# Patient Record
Sex: Male | Born: 1940 | Race: White | Hispanic: No | Smoking: Current every day smoker
Health system: Southern US, Community
[De-identification: ages and names within clinical notes are randomized; demographics above are authoritative.]

## PROBLEM LIST (undated history)

## (undated) DIAGNOSIS — I714 Abdominal aortic aneurysm, without rupture, unspecified: Secondary | ICD-10-CM

## (undated) DIAGNOSIS — E785 Hyperlipidemia, unspecified: Secondary | ICD-10-CM

## (undated) DIAGNOSIS — G4700 Insomnia, unspecified: Secondary | ICD-10-CM

## (undated) DIAGNOSIS — F32A Depression, unspecified: Secondary | ICD-10-CM

## (undated) DIAGNOSIS — E119 Type 2 diabetes mellitus without complications: Secondary | ICD-10-CM

## (undated) DIAGNOSIS — H269 Unspecified cataract: Secondary | ICD-10-CM

## (undated) DIAGNOSIS — F329 Major depressive disorder, single episode, unspecified: Secondary | ICD-10-CM

## (undated) DIAGNOSIS — R51 Headache: Secondary | ICD-10-CM

## (undated) DIAGNOSIS — J449 Chronic obstructive pulmonary disease, unspecified: Secondary | ICD-10-CM

## (undated) DIAGNOSIS — K633 Ulcer of intestine: Secondary | ICD-10-CM

## (undated) DIAGNOSIS — I82409 Acute embolism and thrombosis of unspecified deep veins of unspecified lower extremity: Secondary | ICD-10-CM

## (undated) DIAGNOSIS — K635 Polyp of colon: Secondary | ICD-10-CM

## (undated) DIAGNOSIS — K219 Gastro-esophageal reflux disease without esophagitis: Secondary | ICD-10-CM

## (undated) DIAGNOSIS — I739 Peripheral vascular disease, unspecified: Secondary | ICD-10-CM

## (undated) DIAGNOSIS — I10 Essential (primary) hypertension: Secondary | ICD-10-CM

## (undated) DIAGNOSIS — K649 Unspecified hemorrhoids: Secondary | ICD-10-CM

## (undated) DIAGNOSIS — M109 Gout, unspecified: Secondary | ICD-10-CM

## (undated) DIAGNOSIS — I2510 Atherosclerotic heart disease of native coronary artery without angina pectoris: Secondary | ICD-10-CM

## (undated) DIAGNOSIS — K5790 Diverticulosis of intestine, part unspecified, without perforation or abscess without bleeding: Secondary | ICD-10-CM

## (undated) HISTORY — DX: Gastro-esophageal reflux disease without esophagitis: K21.9

## (undated) HISTORY — PX: COLONOSCOPY: SHX174

## (undated) HISTORY — DX: Acute embolism and thrombosis of unspecified deep veins of unspecified lower extremity: I82.409

## (undated) HISTORY — DX: Gout, unspecified: M10.9

## (undated) HISTORY — DX: Ulcer of intestine: K63.3

## (undated) HISTORY — DX: Unspecified cataract: H26.9

## (undated) HISTORY — DX: Abdominal aortic aneurysm, without rupture, unspecified: I71.40

## (undated) HISTORY — DX: Abdominal aortic aneurysm, without rupture: I71.4

## (undated) HISTORY — DX: Chronic obstructive pulmonary disease, unspecified: J44.9

## (undated) HISTORY — DX: Polyp of colon: K63.5

## (undated) HISTORY — DX: Major depressive disorder, single episode, unspecified: F32.9

## (undated) HISTORY — DX: Diverticulosis of intestine, part unspecified, without perforation or abscess without bleeding: K57.90

## (undated) HISTORY — DX: Unspecified hemorrhoids: K64.9

## (undated) HISTORY — DX: Depression, unspecified: F32.A

## (undated) HISTORY — DX: Hyperlipidemia, unspecified: E78.5

## (undated) HISTORY — DX: Type 2 diabetes mellitus without complications: E11.9

## (undated) HISTORY — PX: HEMICOLECTOMY: SHX854

## (undated) HISTORY — DX: Insomnia, unspecified: G47.00

## (undated) HISTORY — DX: Essential (primary) hypertension: I10

## (undated) HISTORY — DX: Peripheral vascular disease, unspecified: I73.9

---

## 2003-01-03 ENCOUNTER — Encounter: Payer: Self-pay | Admitting: Internal Medicine

## 2003-01-03 HISTORY — PX: ESOPHAGOGASTRODUODENOSCOPY: SHX1529

## 2005-07-08 ENCOUNTER — Ambulatory Visit: Payer: Self-pay | Admitting: Internal Medicine

## 2005-08-01 ENCOUNTER — Ambulatory Visit: Payer: Self-pay | Admitting: Cardiology

## 2005-08-15 ENCOUNTER — Ambulatory Visit: Payer: Self-pay

## 2005-09-08 ENCOUNTER — Ambulatory Visit: Payer: Self-pay

## 2005-11-07 ENCOUNTER — Ambulatory Visit: Payer: Self-pay | Admitting: Cardiology

## 2005-11-11 ENCOUNTER — Ambulatory Visit: Payer: Self-pay

## 2005-12-28 ENCOUNTER — Ambulatory Visit: Payer: Self-pay | Admitting: Cardiology

## 2006-01-03 ENCOUNTER — Inpatient Hospital Stay (HOSPITAL_BASED_OUTPATIENT_CLINIC_OR_DEPARTMENT_OTHER): Admission: RE | Admit: 2006-01-03 | Discharge: 2006-01-03 | Payer: Self-pay | Admitting: Cardiology

## 2006-01-03 ENCOUNTER — Ambulatory Visit: Payer: Self-pay | Admitting: Cardiology

## 2007-10-05 ENCOUNTER — Ambulatory Visit: Payer: Self-pay | Admitting: Internal Medicine

## 2007-10-25 ENCOUNTER — Encounter: Payer: Self-pay | Admitting: Internal Medicine

## 2007-10-25 ENCOUNTER — Ambulatory Visit: Payer: Self-pay | Admitting: Internal Medicine

## 2008-01-03 DIAGNOSIS — I739 Peripheral vascular disease, unspecified: Secondary | ICD-10-CM

## 2008-01-03 DIAGNOSIS — E785 Hyperlipidemia, unspecified: Secondary | ICD-10-CM

## 2008-01-03 DIAGNOSIS — D126 Benign neoplasm of colon, unspecified: Secondary | ICD-10-CM | POA: Insufficient documentation

## 2008-01-03 DIAGNOSIS — F17200 Nicotine dependence, unspecified, uncomplicated: Secondary | ICD-10-CM | POA: Insufficient documentation

## 2008-01-03 DIAGNOSIS — E1069 Type 1 diabetes mellitus with other specified complication: Secondary | ICD-10-CM

## 2008-01-03 DIAGNOSIS — I25810 Atherosclerosis of coronary artery bypass graft(s) without angina pectoris: Secondary | ICD-10-CM

## 2008-01-03 DIAGNOSIS — K648 Other hemorrhoids: Secondary | ICD-10-CM | POA: Insufficient documentation

## 2008-01-03 DIAGNOSIS — K219 Gastro-esophageal reflux disease without esophagitis: Secondary | ICD-10-CM

## 2008-01-03 DIAGNOSIS — I10 Essential (primary) hypertension: Secondary | ICD-10-CM

## 2008-01-24 ENCOUNTER — Encounter: Payer: Self-pay | Admitting: Internal Medicine

## 2009-03-30 ENCOUNTER — Telehealth: Payer: Self-pay | Admitting: Internal Medicine

## 2009-04-23 ENCOUNTER — Ambulatory Visit: Payer: Self-pay | Admitting: Internal Medicine

## 2009-04-23 DIAGNOSIS — Z860100 Personal history of colon polyps, unspecified: Secondary | ICD-10-CM | POA: Insufficient documentation

## 2009-04-28 ENCOUNTER — Encounter: Payer: Self-pay | Admitting: Internal Medicine

## 2009-04-28 ENCOUNTER — Ambulatory Visit: Payer: Self-pay | Admitting: Internal Medicine

## 2009-06-24 ENCOUNTER — Ambulatory Visit (HOSPITAL_COMMUNITY): Admission: RE | Admit: 2009-06-24 | Discharge: 2009-06-24 | Payer: Self-pay | Admitting: General Surgery

## 2009-07-21 ENCOUNTER — Ambulatory Visit: Payer: Self-pay | Admitting: Cardiology

## 2009-07-29 ENCOUNTER — Telehealth (INDEPENDENT_AMBULATORY_CARE_PROVIDER_SITE_OTHER): Payer: Self-pay

## 2009-07-30 ENCOUNTER — Ambulatory Visit: Payer: Self-pay

## 2009-07-30 ENCOUNTER — Encounter: Payer: Self-pay | Admitting: Cardiology

## 2009-08-10 ENCOUNTER — Telehealth: Payer: Self-pay | Admitting: Cardiology

## 2009-08-17 ENCOUNTER — Telehealth (INDEPENDENT_AMBULATORY_CARE_PROVIDER_SITE_OTHER): Payer: Self-pay | Admitting: *Deleted

## 2009-08-18 ENCOUNTER — Inpatient Hospital Stay (HOSPITAL_COMMUNITY): Admission: RE | Admit: 2009-08-18 | Discharge: 2009-08-28 | Payer: Self-pay | Admitting: General Surgery

## 2009-08-18 ENCOUNTER — Encounter (INDEPENDENT_AMBULATORY_CARE_PROVIDER_SITE_OTHER): Payer: Self-pay | Admitting: General Surgery

## 2009-08-18 ENCOUNTER — Encounter: Payer: Self-pay | Admitting: Internal Medicine

## 2009-08-18 ENCOUNTER — Ambulatory Visit: Payer: Self-pay | Admitting: Internal Medicine

## 2009-10-30 ENCOUNTER — Encounter: Payer: Self-pay | Admitting: Internal Medicine

## 2010-04-20 IMAGING — CR DG CHEST 1V PORT
1 series · 1 of 1 positions shown · non-contrast
Comparison: 08/22/2009

CLINICAL DATA: Short of breath

PORTABLE CHEST - 1 VIEW

[view not recorded]
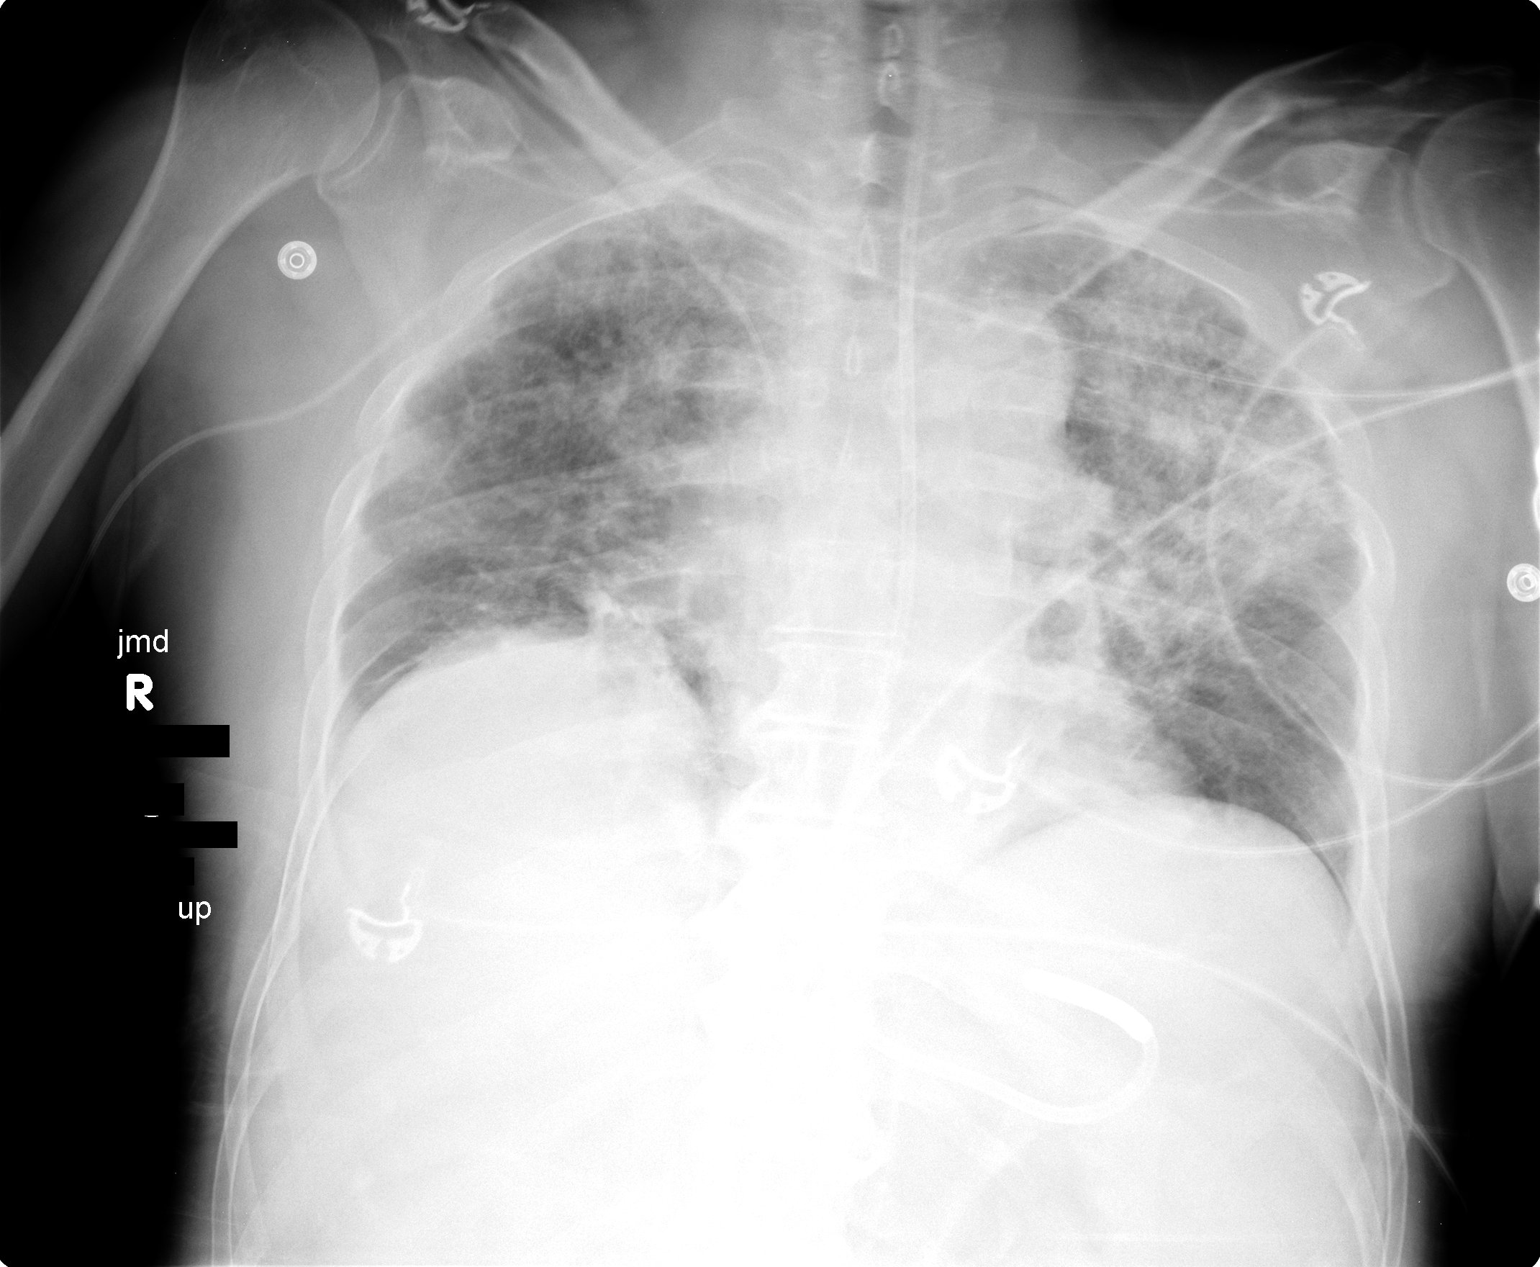

[1 of 1 positions shown; findings below may reference images not displayed]

FINDINGS: Stable tubular devices.  Bilateral airspace disease most
severe in the left upper lobe is worse.  No pneumothorax.  Low lung
volumes.
IMPRESSION: Worsening bilateral airspace disease.

## 2010-04-29 ENCOUNTER — Encounter (INDEPENDENT_AMBULATORY_CARE_PROVIDER_SITE_OTHER): Payer: Self-pay | Admitting: *Deleted

## 2010-12-21 NOTE — Letter (Signed)
Summary: Colonoscopy Letter  [facility]  [address] [place], [place] [address]   Phone: [phone]  Fax: [phone]      April 29, 2010 MRN: [id number]   [facility] [address] [place], [place]  [address]   Dear Perry Kennedy,   According to your medical record, it is time for you to schedule a Colonoscopy. The American Cancer Society recommends this procedure as a method to detect early colon cancer. Patients with a family history of colon cancer, or a personal history of colon polyps or inflammatory bowel disease are at increased risk.  This letter has beeen generated based on the recommendations made at the time of your procedure. If you feel that in your particular situation this may no longer apply, please contact our office.  Please call our office at [phone] to schedule this appointment or to update your records at your earliest convenience.  Thank you for cooperating with [place] to provide you with the very best care possible.   Sincerely,    Iva Boop, M.D.  [facility] [phone]

## 2011-02-24 LAB — BASIC METABOLIC PANEL
BUN: 11 mg/dL (ref 6–23)
BUN: 9 mg/dL (ref 6–23)
CO2: 27 mEq/L (ref 19–32)
CO2: 30 mEq/L (ref 19–32)
Calcium: 8.2 mg/dL — ABNORMAL LOW (ref 8.4–10.5)
Calcium: 8.6 mg/dL (ref 8.4–10.5)
Calcium: 8.8 mg/dL (ref 8.4–10.5)
Chloride: 101 mEq/L (ref 96–112)
Chloride: 98 mEq/L (ref 96–112)
Creatinine, Ser: 0.68 mg/dL (ref 0.4–1.5)
Creatinine, Ser: 0.68 mg/dL (ref 0.4–1.5)
GFR calc Af Amer: >60 mL/min (ref >60)
GFR calc Af Amer: >60 mL/min (ref >60)
GFR calc non Af Amer: >60 mL/min (ref >60)
GFR calc non Af Amer: >60 mL/min (ref >60)
Glucose, Bld: 102 mg/dL — ABNORMAL HIGH (ref 70–99)
Glucose, Bld: 173 mg/dL — ABNORMAL HIGH (ref 70–99)
Potassium: 3.6 mEq/L (ref 3.5–5.1)
Potassium: 3.7 mEq/L (ref 3.5–5.1)
Sodium: 134 mEq/L — ABNORMAL LOW (ref 135–145)
Sodium: 136 mEq/L (ref 135–145)

## 2011-02-24 LAB — URINE MICROSCOPIC-ADD ON

## 2011-02-24 LAB — CBC
HCT: 33.3 % — ABNORMAL LOW (ref 39.0–52.0)
HCT: 34.4 % — ABNORMAL LOW (ref 39.0–52.0)
HCT: 36.9 % — ABNORMAL LOW (ref 39.0–52.0)
Hemoglobin: 10.9 g/dL — ABNORMAL LOW (ref 13.0–17.0)
Hemoglobin: 12.1 g/dL — ABNORMAL LOW (ref 13.0–17.0)
Hemoglobin: 12.8 g/dL — ABNORMAL LOW (ref 13.0–17.0)
MCHC: 34.8 g/dL (ref 30.0–36.0)
MCHC: 35.2 g/dL (ref 30.0–36.0)
MCV: 86 fL (ref 78.0–100.0)
MCV: 87 fL (ref 78.0–100.0)
MCV: 87.1 fL (ref 78.0–100.0)
Platelets: 137 10*3/uL — ABNORMAL LOW (ref 150–400)
Platelets: 175 10*3/uL (ref 150–400)
Platelets: 189 10*3/uL (ref 150–400)
Platelets: 244 10*3/uL (ref 150–400)
Platelets: 275 10*3/uL (ref 150–400)
RBC: 3.62 MIL/uL — ABNORMAL LOW (ref 4.22–5.81)
RBC: 3.69 MIL/uL — ABNORMAL LOW (ref 4.22–5.81)
RDW: 13.5 % (ref 11.5–15.5)
RDW: 13.5 % (ref 11.5–15.5)
RDW: 13.7 % (ref 11.5–15.5)
WBC: 6.3 10*3/uL (ref 4.0–10.5)
WBC: 6.9 10*3/uL (ref 4.0–10.5)
WBC: 7.5 10*3/uL (ref 4.0–10.5)
WBC: 7.6 10*3/uL (ref 4.0–10.5)

## 2011-02-24 LAB — GLUCOSE, CAPILLARY
Glucose-Capillary: 111 mg/dL — ABNORMAL HIGH (ref 70–99)
Glucose-Capillary: 124 mg/dL — ABNORMAL HIGH (ref 70–99)
Glucose-Capillary: 126 mg/dL — ABNORMAL HIGH (ref 70–99)
Glucose-Capillary: 130 mg/dL — ABNORMAL HIGH (ref 70–99)
Glucose-Capillary: 135 mg/dL — ABNORMAL HIGH (ref 70–99)
Glucose-Capillary: 136 mg/dL — ABNORMAL HIGH (ref 70–99)
Glucose-Capillary: 137 mg/dL — ABNORMAL HIGH (ref 70–99)
Glucose-Capillary: 157 mg/dL — ABNORMAL HIGH (ref 70–99)
Glucose-Capillary: 157 mg/dL — ABNORMAL HIGH (ref 70–99)
Glucose-Capillary: 159 mg/dL — ABNORMAL HIGH (ref 70–99)
Glucose-Capillary: 161 mg/dL — ABNORMAL HIGH (ref 70–99)
Glucose-Capillary: 165 mg/dL — ABNORMAL HIGH (ref 70–99)
Glucose-Capillary: 171 mg/dL — ABNORMAL HIGH (ref 70–99)
Glucose-Capillary: 180 mg/dL — ABNORMAL HIGH (ref 70–99)
Glucose-Capillary: 194 mg/dL — ABNORMAL HIGH (ref 70–99)
Glucose-Capillary: 213 mg/dL — ABNORMAL HIGH (ref 70–99)

## 2011-02-24 LAB — BLOOD GAS, ARTERIAL
Bicarbonate: 29.9 mEq/L — ABNORMAL HIGH (ref 20.0–24.0)
Drawn by: 317771
FIO2: 0.6 %
MECHVT: 600 mL
pH, Arterial: 7.425 (ref 7.350–7.450)

## 2011-02-24 LAB — COMPREHENSIVE METABOLIC PANEL
AST: 40 U/L — ABNORMAL HIGH (ref 0–37)
Albumin: 2.7 g/dL — ABNORMAL LOW (ref 3.5–5.2)
Albumin: 2.9 g/dL — ABNORMAL LOW (ref 3.5–5.2)
BUN: 10 mg/dL (ref 6–23)
Calcium: 8.9 mg/dL (ref 8.4–10.5)
Calcium: 9.2 mg/dL (ref 8.4–10.5)
Chloride: 97 mEq/L (ref 96–112)
Creatinine, Ser: 1.08 mg/dL (ref 0.4–1.5)
GFR calc Af Amer: >60 mL/min (ref >60)
Glucose, Bld: 203 mg/dL — ABNORMAL HIGH (ref 70–99)
Sodium: 134 mEq/L — ABNORMAL LOW (ref 135–145)
Total Bilirubin: 1.4 mg/dL — ABNORMAL HIGH (ref 0.3–1.2)
Total Protein: 6.3 g/dL (ref 6.0–8.3)

## 2011-02-24 LAB — URINALYSIS, ROUTINE W REFLEX MICROSCOPIC
Bilirubin Urine: NEGATIVE
Glucose, UA: NEGATIVE mg/dL
Ketones, ur: NEGATIVE mg/dL
pH: 7 (ref 5.0–8.0)

## 2011-02-24 LAB — MAGNESIUM
Magnesium: 1.6 mg/dL (ref 1.5–2.5)
Magnesium: 1.7 mg/dL (ref 1.5–2.5)

## 2011-02-24 LAB — VITAMIN B12: Vitamin B-12: 294 pg/mL (ref 211–911)

## 2011-02-24 LAB — AMMONIA: Ammonia: 30 umol/L (ref 11–35)

## 2011-02-24 LAB — URINE CULTURE: Special Requests: NEGATIVE

## 2011-02-24 LAB — FOLATE: Folate: 8.2 ng/mL

## 2011-02-24 LAB — VANCOMYCIN, TROUGH: Vancomycin Tr: 22.7 ug/mL — ABNORMAL HIGH (ref 10.0–20.0)

## 2011-02-25 LAB — COMPREHENSIVE METABOLIC PANEL
ALT: 38 U/L (ref 0–53)
CO2: 27 mEq/L (ref 19–32)
Calcium: 9.9 mg/dL (ref 8.4–10.5)
Creatinine, Ser: 0.79 mg/dL (ref 0.4–1.5)
GFR calc non Af Amer: >60 mL/min (ref >60)
Glucose, Bld: 178 mg/dL — ABNORMAL HIGH (ref 70–99)
Total Bilirubin: 0.7 mg/dL (ref 0.3–1.2)

## 2011-02-25 LAB — MAGNESIUM: Magnesium: 1.6 mg/dL (ref 1.5–2.5)

## 2011-02-25 LAB — CBC
HCT: 38.8 % — ABNORMAL LOW (ref 39.0–52.0)
Hemoglobin: 12.2 g/dL — ABNORMAL LOW (ref 13.0–17.0)
Hemoglobin: 13.6 g/dL (ref 13.0–17.0)
Hemoglobin: 15.4 g/dL (ref 13.0–17.0)
MCHC: 34.8 g/dL (ref 30.0–36.0)
MCHC: 34.9 g/dL (ref 30.0–36.0)
MCHC: 35.6 g/dL (ref 30.0–36.0)
MCV: 86.3 fL (ref 78.0–100.0)
MCV: 87 fL (ref 78.0–100.0)
Platelets: 168 10*3/uL (ref 150–400)
RBC: 4.03 MIL/uL — ABNORMAL LOW (ref 4.22–5.81)
RBC: 5.01 MIL/uL (ref 4.22–5.81)
RDW: 14.4 % (ref 11.5–15.5)
WBC: 9.5 10*3/uL (ref 4.0–10.5)

## 2011-02-25 LAB — BLOOD GAS, ARTERIAL
Acid-Base Excess: 4.8 mmol/L — ABNORMAL HIGH (ref 0.0–2.0)
Acid-Base Excess: 5.1 mmol/L — ABNORMAL HIGH (ref 0.0–2.0)
Bicarbonate: 27.6 mEq/L — ABNORMAL HIGH (ref 20.0–24.0)
Bicarbonate: 29.6 mEq/L — ABNORMAL HIGH (ref 20.0–24.0)
Drawn by: 103701
FIO2: 1 %
FIO2: 1 %
FIO2: 1 %
O2 Saturation: 88.1 %
Patient temperature: 98.6
TCO2: 26.6 mmol/L (ref 0–100)
pCO2 arterial: 45.1 mmHg — ABNORMAL HIGH (ref 35.0–45.0)
pCO2 arterial: 48.8 mmHg — ABNORMAL HIGH (ref 35.0–45.0)
pO2, Arterial: 109 mmHg — ABNORMAL HIGH (ref 80.0–100.0)
pO2, Arterial: 57.2 mmHg — ABNORMAL LOW (ref 80.0–100.0)

## 2011-02-25 LAB — PROTIME-INR
INR: 1 (ref 0.00–1.49)
Prothrombin Time: 13 seconds (ref 11.6–15.2)

## 2011-02-25 LAB — GLUCOSE, CAPILLARY
Glucose-Capillary: 124 mg/dL — ABNORMAL HIGH (ref 70–99)
Glucose-Capillary: 134 mg/dL — ABNORMAL HIGH (ref 70–99)
Glucose-Capillary: 144 mg/dL — ABNORMAL HIGH (ref 70–99)
Glucose-Capillary: 154 mg/dL — ABNORMAL HIGH (ref 70–99)
Glucose-Capillary: 167 mg/dL — ABNORMAL HIGH (ref 70–99)
Glucose-Capillary: 187 mg/dL — ABNORMAL HIGH (ref 70–99)

## 2011-02-25 LAB — CULTURE, BAL-QUANTITATIVE W GRAM STAIN: Colony Count: 25000

## 2011-02-25 LAB — BASIC METABOLIC PANEL
BUN: 11 mg/dL (ref 6–23)
CO2: 28 mEq/L (ref 19–32)
Calcium: 9.1 mg/dL (ref 8.4–10.5)
Chloride: 104 mEq/L (ref 96–112)
Creatinine, Ser: 0.78 mg/dL (ref 0.4–1.5)
GFR calc Af Amer: >60 mL/min (ref >60)
GFR calc non Af Amer: >60 mL/min (ref >60)
Glucose, Bld: 172 mg/dL — ABNORMAL HIGH (ref 70–99)
Potassium: 3.9 mEq/L (ref 3.5–5.1)
Sodium: 138 mEq/L (ref 135–145)
Sodium: 139 mEq/L (ref 135–145)

## 2011-02-25 LAB — HEMOGLOBIN A1C
Hgb A1c MFr Bld: 7.1 % — ABNORMAL HIGH (ref 4.6–6.1)
Mean Plasma Glucose: 157 mg/dL

## 2011-02-25 LAB — DIFFERENTIAL
Basophils Absolute: 0 10*3/uL (ref 0.0–0.1)
Eosinophils Absolute: 0.1 10*3/uL (ref 0.0–0.7)
Lymphocytes Relative: 27 % (ref 12–46)
Lymphs Abs: 1.8 10*3/uL (ref 0.7–4.0)
Neutrophils Relative %: 67 % (ref 43–77)

## 2011-02-25 LAB — PHOSPHORUS: Phosphorus: 1.9 mg/dL — ABNORMAL LOW (ref 2.3–4.6)

## 2011-02-25 LAB — URINE CULTURE: Special Requests: NEGATIVE

## 2011-02-25 LAB — TYPE AND SCREEN: Antibody Screen: NEGATIVE

## 2011-02-25 LAB — URINALYSIS, ROUTINE W REFLEX MICROSCOPIC
Bilirubin Urine: NEGATIVE
Glucose, UA: NEGATIVE mg/dL
Hgb urine dipstick: NEGATIVE
Specific Gravity, Urine: 1.007 (ref 1.005–1.030)
pH: 6 (ref 5.0–8.0)

## 2011-02-25 LAB — SEDIMENTATION RATE: Sed Rate: 25 mm/hr — ABNORMAL HIGH (ref 0–16)

## 2011-02-25 LAB — BRAIN NATRIURETIC PEPTIDE: Pro B Natriuretic peptide (BNP): 88.3 pg/mL (ref 0.0–100.0)

## 2011-02-25 LAB — CULTURE, BLOOD (ROUTINE X 2): Culture: NO GROWTH

## 2011-04-05 NOTE — Assessment & Plan Note (Signed)
[facility]                         GASTROENTEROLOGY OFFICE NOTE   NAME:Perry Kennedy, Perry Kennedy                   MRN:          [id number]  DATE:10/05/2007                            DOB:          [birth year]    CHIEF COMPLAINT:  History of polyps.   PROBLEMS:  1. Past history of colon polyps, adenomatous polyp, 10 mm sessile      polyp removed in January of 2004.  He had not shown up after his      followup letter in 2006.  2. Coronary artery disease, medical management.  3. Peripheral vascular disease, on Pletal.  4. Smoker.  5. History of abnormal liver function tests thought to be metabolic      syndrome.  6. Dyslipidemia.  7. Hypertension.  8. Prior left leg injury due to tractor accident.  9. Reflux disease in the past.  10.Carotid artery disease and stenosis.   MEDICATIONS:  1. Lisinopril hydrochlorothiazide 20/12.5 mg a day.  2. Metoprolol 100 mg half daily.  3. Cilostazol 100 mg b.i.d.  4. Aspirin 81 mg daily.   DRUG ALLERGIES:  None known.   INTERVAL HISTORY:  He had no colon symptoms at this time.  He had had  some heart problems, had a catheterization, had angina issues.  He is  not having any significant claudication with his current medication.  He  is denying any chest pain or respiratory problems.   See medical history form for full details, otherwise.   PHYSICAL EXAMINATION:  Shows him to be a tanned, elderly white male in  no acute distress.  Height 5 feet 6 inches, weight 161 pounds.  Blood pressure 150/82, pulse  80.  LUNGS:  Clear.  HEART:  S1, S2, no murmurs, rubs, or gallops.  ABDOMEN:  Soft, nontender.  SKIN:  Tanned.  He is alert and oriented x3.   ASSESSMENT:  Personal history of colon polyps in a man on cilostazol.   PLAN:  Go ahead and perform a colonoscopy.  Will hold his cilostazol 1  week before to reduce the risk of bleeding with polypectomy.  This  medication is being given for peripheral vascular  disease, so I think it is reasonable to hold this medication at this  time.  Further plans pending the above.     Iva Boop, MD,FACG  Electronically Signed    CEG/MedQ  DD: 10/05/2007  DT: 10/06/2007  Job #: [id number]

## 2011-04-08 NOTE — Cardiovascular Report (Signed)
NAMEIAM, LIPSON NO.:  [id number]   MEDICAL RECORD NO.:  [id number]          PATIENT TYPE:  OIB   LOCATION:  NA                           FACILITY:  [facility]   PHYSICIAN:  Rollene Rotunda, M.D.   DATE OF BIRTH:  18-Jun-1941   DATE OF PROCEDURE:  01/03/2006  DATE OF DISCHARGE:                              CARDIAC CATHETERIZATION   PROCEDURE:  Left heart catheterization/coronary arteriography.   REASON FOR PRESENTATION:  Evaluate patient with back pain suggestive of  possible unstable angina.  He has significant cardiovascular risk factors  and known peripheral vascular disease.  He did have a Cardiolite which did  not demonstrate any focal ischemia.  However, it did suggest the possibility  of global ischemia with left ventricular dilatation during stress imaging.   PROCEDURE NOTE:  Left heart catheterization was performed via the right  femoral artery.  The artery was cannulated easily using anterior wall  puncture.  A #4 French arterial sheath was inserted via modified Seldinger  technique.  There was some difficulty initially placing a guide-wire above  the aortic bifurcation.  Therefore, I did not advance further with the left  coronary catheter but went up instead to the right coronary catheter and a  Wholey wire.  All subsequent catheter exchanges were made over a wire above  the area of probable plaque in the abdominal aorta.  The patient tolerated  the procedure well and left the lab in stable condition.   RESULTS:  Hemodynamics:  LV 113/8, AO 115/86.   Coronaries:  The left main was heavily calcified.  There was a long, diffuse  30% calcified plaque.  The LAD was heavily calcified in the proximal mid  segment.  There was a long proximal mid 30-40% stenosis.  This narrowed to a  focal 50% stenosis before a large mid diagonal.  The mid diagonal was a  large to moderate size vessel.  It had ostial 80% narrowing.  The circumflex  in the AV groove was  normal.  There was a large ramus intermediate which had  ostial 25% stenosis.  There was a mid obtuse marginal which was moderate  size and normal.  The right coronary artery had proximal mild calcification.  There was a focal 25% proximal plaque.  The remainder of the vessel was free  of high grade disease in the main segments.  There was a large acute  marginal with an ostial 70% stenosis.  The PDA was large and normal.   Left ventriculogram:  The left ventriculogram was obtained in the RAO  projection.  The EF was 65% with normal wall motion.   Abdominal aortogram:  The abdominal aortogram was obtained secondary to his  known peripheral vascular disease, claudication, and the difficulty  advancing the wire.  He was found to have some diffuse, non-obstructive  plaque in his distal aorta and bilateral iliacs.  There was a very small  infrarenal abdominal aortic aneurysm.  There was an 80% plaque in the left  iliac proximally.   CONCLUSION:  Heavily calcified vessels including abdominal aorta and left  coronary system in  particular.  He does have non-obstructive disease,  particularly in the LAD.  There is a high grade lesion in the ostium of the  diagonal.  Fixing this would compromise the lumen of the main LAD.  In  addition, the Cardiolite did not suggest regional ischemia in this  distribution.  The patient's symptoms are not directly related to angina.   Therefore, based on all of this, the patient should be managed medically.  Given the extent of his calcification plaque burden, he needs to be  aggressively counseled and will be on primary risk reduction to include  stopping smoking and lipid control.           ______________________________  Rollene Rotunda, M.D.     JH/MEDQ  D:  01/03/2006  T:  01/03/2006  Job:  [id number]   cc:   Alfredia Client, M.D.

## 2011-12-12 ENCOUNTER — Telehealth: Payer: Self-pay | Admitting: Gastroenterology

## 2011-12-12 NOTE — Telephone Encounter (Signed)
Called and talked to Mrs. Platas, told her for patient to call back and schedule Colonoscopy. She stated that she would get patient to call.

## 2013-03-26 ENCOUNTER — Other Ambulatory Visit: Payer: Self-pay | Admitting: *Deleted

## 2013-03-26 MED ORDER — METFORMIN HCL 500 MG PO TABS: 500.0000 mg | ORAL_TABLET | Freq: Two times a day (BID) | ORAL | Status: DC

## 2013-03-26 MED ORDER — LISINOPRIL 20 MG PO TABS: 20.0000 mg | ORAL_TABLET | Freq: Every day | ORAL | Status: DC

## 2013-03-26 MED ORDER — ATORVASTATIN CALCIUM 40 MG PO TABS: 40.0000 mg | ORAL_TABLET | Freq: Every day | ORAL | Status: DC

## 2013-03-26 MED ORDER — CILOSTAZOL 100 MG PO TABS: 100.0000 mg | ORAL_TABLET | Freq: Two times a day (BID) | ORAL | Status: DC

## 2013-03-26 MED ORDER — ESOMEPRAZOLE MAGNESIUM 40 MG PO CPDR: 40.0000 mg | DELAYED_RELEASE_CAPSULE | Freq: Every day | ORAL | Status: DC

## 2013-03-26 NOTE — Telephone Encounter (Signed)
LAST OV 9/13. LAST LABS 9/13. LAST AIC 6.6.

## 2013-04-03 ENCOUNTER — Other Ambulatory Visit: Payer: Self-pay | Admitting: Nurse Practitioner

## 2013-04-04 NOTE — Telephone Encounter (Signed)
Don't see this in his chart/ cHART SENT BACK

## 2013-06-03 ENCOUNTER — Other Ambulatory Visit: Payer: Self-pay | Admitting: *Deleted

## 2013-06-03 MED ORDER — CILOSTAZOL 100 MG PO TABS: 100.0000 mg | ORAL_TABLET | Freq: Two times a day (BID) | ORAL | Status: DC

## 2013-06-03 MED ORDER — METFORMIN HCL 500 MG PO TABS: 500.0000 mg | ORAL_TABLET | Freq: Two times a day (BID) | ORAL | Status: DC

## 2013-06-03 MED ORDER — LISINOPRIL 20 MG PO TABS: 20.0000 mg | ORAL_TABLET | Freq: Every day | ORAL | Status: DC

## 2013-06-03 MED ORDER — ATORVASTATIN CALCIUM 40 MG PO TABS: 40.0000 mg | ORAL_TABLET | Freq: Every day | ORAL | Status: DC

## 2013-06-03 MED ORDER — ESOMEPRAZOLE MAGNESIUM 40 MG PO CPDR: 40.0000 mg | DELAYED_RELEASE_CAPSULE | Freq: Every day | ORAL | Status: DC

## 2013-06-07 ENCOUNTER — Other Ambulatory Visit: Payer: Self-pay | Admitting: Nurse Practitioner

## 2013-07-23 ENCOUNTER — Telehealth: Payer: Self-pay | Admitting: Family Medicine

## 2013-07-23 ENCOUNTER — Encounter: Payer: Self-pay | Admitting: Family Medicine

## 2013-07-23 ENCOUNTER — Ambulatory Visit (INDEPENDENT_AMBULATORY_CARE_PROVIDER_SITE_OTHER): Payer: Medicare Other | Admitting: Family Medicine

## 2013-07-23 VITALS — BP 170/87 | HR 109 | Temp 97.3°F | Ht 66.0 in | Wt 142.0 lb

## 2013-07-23 DIAGNOSIS — K2970 Gastritis, unspecified, without bleeding: Secondary | ICD-10-CM

## 2013-07-23 DIAGNOSIS — E119 Type 2 diabetes mellitus without complications: Secondary | ICD-10-CM

## 2013-07-23 DIAGNOSIS — R079 Chest pain, unspecified: Secondary | ICD-10-CM

## 2013-07-23 DIAGNOSIS — Z0000 Encounter for general adult medical examination without abnormal findings: Secondary | ICD-10-CM

## 2013-07-23 DIAGNOSIS — E785 Hyperlipidemia, unspecified: Secondary | ICD-10-CM

## 2013-07-23 DIAGNOSIS — M109 Gout, unspecified: Secondary | ICD-10-CM

## 2013-07-23 DIAGNOSIS — I10 Essential (primary) hypertension: Secondary | ICD-10-CM

## 2013-07-23 DIAGNOSIS — I2510 Atherosclerotic heart disease of native coronary artery without angina pectoris: Secondary | ICD-10-CM

## 2013-07-23 DIAGNOSIS — K219 Gastro-esophageal reflux disease without esophagitis: Secondary | ICD-10-CM

## 2013-07-23 LAB — POCT CBC
Granulocyte percent: 77.1 %G (ref 37–80)
HCT, POC: 44 % (ref 43.5–53.7)
Hemoglobin: 15.1 g/dL (ref 14.1–18.1)
Lymph, poc: 1.6 (ref 0.6–3.4)
MCH, POC: 29.2 pg (ref 27–31.2)
MCHC: 34.4 g/dL (ref 31.8–35.4)
MCV: 84.8 fL (ref 80–97)
MPV: 5.8 fL (ref 0–99.8)
POC Granulocyte: 9.8 — AB (ref 2–6.9)
POC LYMPH PERCENT: 18.2 %L (ref 10–50)
Platelet Count, POC: 291 10*3/uL (ref 142–424)
RBC: 5.2 M/uL (ref 4.69–6.13)
RDW, POC: 14.2 %
WBC: 8.8 10*3/uL (ref 4.6–10.2)

## 2013-07-23 LAB — POCT GLYCOSYLATED HEMOGLOBIN (HGB A1C): Hemoglobin A1C: 6.1

## 2013-07-23 MED ORDER — SUCRALFATE 1 GM/10ML PO SUSP: 1.0000 g | Freq: Four times a day (QID) | ORAL | Status: DC

## 2013-07-23 MED ORDER — METFORMIN HCL 500 MG PO TABS: 500.0000 mg | ORAL_TABLET | Freq: Two times a day (BID) | ORAL | Status: DC

## 2013-07-23 MED ORDER — DICLOFENAC SODIUM 75 MG PO TBEC: 75.0000 mg | DELAYED_RELEASE_TABLET | Freq: Two times a day (BID) | ORAL | Status: DC

## 2013-07-23 MED ORDER — LISINOPRIL 20 MG PO TABS: 20.0000 mg | ORAL_TABLET | Freq: Every day | ORAL | Status: DC

## 2013-07-23 MED ORDER — CILOSTAZOL 100 MG PO TABS: 100.0000 mg | ORAL_TABLET | Freq: Two times a day (BID) | ORAL | Status: DC

## 2013-07-23 MED ORDER — ATORVASTATIN CALCIUM 40 MG PO TABS: 40.0000 mg | ORAL_TABLET | Freq: Every day | ORAL | Status: DC

## 2013-07-23 MED ORDER — METHYLPREDNISOLONE (PAK) 4 MG PO TABS: ORAL_TABLET | ORAL | Status: DC

## 2013-07-23 MED ORDER — ESOMEPRAZOLE MAGNESIUM 40 MG PO CPDR: 40.0000 mg | DELAYED_RELEASE_CAPSULE | Freq: Every day | ORAL | Status: DC

## 2013-07-23 NOTE — Telephone Encounter (Signed)
Please call on clarification one Carafate need to make sure directions and quanity you sent in

## 2013-07-23 NOTE — Progress Notes (Signed)
Subjective:    Patient ID: Perry Kennedy, male    DOB: 1940-12-24, 72 y.o.   MRN: [id number]  HPI This 72 y.o. male presents for evaluation of hypertension.  He has hx of cad, hypertension, Gerd, hyperlipidemia, tobacco abuse, and gastritis.  His wife accompanies him and states he Has been taking her carafate for stomach pains.  His wife states he has been having chest pain Really bad a few months ago and she states she thinks he may have had a heart attack.  He currently is not having chest pain.  He has not seen his cardiologist Dr. Lanier Clam in 8 years.  He gets occasional gout flares.    Review of Systems C/o chest pain and gout. No SOB, HA, dizziness, vision change, N/V, diarrhea, constipation, dysuria, urinary urgency or frequency, myalgias, arthralgias or rash.     Objective:   Physical Exam Vital signs noted  Well developed well nourished male.  HEENT - Head atraumatic Normocephalic                Eyes - PERRLA, Conjuctiva - clear Sclera- Clear EOMI                Ears - EAC's Wnl TM's Wnl Gross Hearing WNL                Nose - Nares patent                 Throat - oropharanx wnl Respiratory - Lungs CTA bilateral Cardiac - RRR S1 and S2 without murmur GI - Abdomen soft Nontender and bowel sounds active x 4 Rectal - Prostate smooth and wnl hemocult stool negative. Extremities - No edema. Neuro - Grossly intact.   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  Results for orders placed in visit on 07/23/13  POCT GLYCOSYLATED HEMOGLOBIN (HGB A1C)      Result Value Range   Hemoglobin A1C 6.1%    POCT CBC      Result Value Range   WBC 8.8  4.6 - 10.2 K/uL   Lymph, poc 1.6  0.6 - 3.4   POC LYMPH PERCENT 18.2  10 - 50 %L   POC Granulocyte 9.8 (*) 2 - 6.9   Granulocyte percent 77.1  37 - 80 %G   RBC 5.2  4.69 - 6.13 M/uL   Hemoglobin 15.1  14.1 - 18.1 g/dL   HCT, POC 40.9  81.1 - 53.7 %   MCV 84.8  80 - 97 fL   MCH, POC 29.2  27 - 31.2 pg   MCHC 34.4  31.8 - 35.4 g/dL   RDW, POC 91.4     Platelet Count, POC 291.0  142 - 424 K/uL   MPV 5.8  0 - 99.8 fL      Assessment & Plan:  Routine general medical examination at a health care facility - Plan: EKG 12-Lead, POCT CBC, CMP14+EGFR, PSA, total and free, Thyroid Panel With TSH.  Follow up in one week.  Coronary artery disease - Plan: cilostazol (PLETAL) 100 MG tablet, Ambulatory referral to Cardiology  Chest pain - Plan: Ambulatory referral to Cardiology.  His EKG shows old MI and recommend if he gets anymore chest pain to go to ED.  GERD (gastroesophageal reflux disease) - Plan: esomeprazole (NEXIUM) 40 MG capsule, sucralfate (CARAFATE) 1 GM/10ML suspension  Gastritis - Continue Carafate.  Gout - Plan: diclofenac (VOLTAREN) 75 MG EC tablet, Uric acid level and follow up next week.  Diabetes - Plan: metFORMIN (GLUCOPHAGE) 500 MG tablet, POCT glycosylated hemoglobin (Hb A1C) controlled and continue metformin.  Essential hypertension, benign - Plan: lisinopril (PRINIVIL,ZESTRIL) 20 MG tablet.  Take full tablet and follow up next week for bp evaluation.  Other and unspecified hyperlipidemia - Plan: atorvastatin (LIPITOR) 40 Mg po qd.  Check lipid panel

## 2013-07-23 NOTE — Patient Instructions (Signed)
Chest Pain (Nonspecific) °It is often hard to give a specific diagnosis for the cause of chest pain. There is always a chance that your pain could be related to something serious, such as a heart attack or a blood clot in the lungs. You need to follow up with your caregiver for further evaluation. °CAUSES  °· Heartburn. °· Pneumonia or bronchitis. °· Anxiety or stress. °· Inflammation around your heart (pericarditis) or lung (pleuritis or pleurisy). °· A blood clot in the lung. °· A collapsed lung (pneumothorax). It can develop suddenly on its own (spontaneous pneumothorax) or from injury (trauma) to the chest. °· Shingles infection (herpes zoster virus). °The chest wall is composed of bones, muscles, and cartilage. Any of these can be the source of the pain. °· The bones can be bruised by injury. °· The muscles or cartilage can be strained by coughing or overwork. °· The cartilage can be affected by inflammation and become sore (costochondritis). °DIAGNOSIS  °Lab tests or other studies, such as X-rays, electrocardiography, stress testing, or cardiac imaging, may be needed to find the cause of your pain.  °TREATMENT  °· Treatment depends on what may be causing your chest pain. Treatment may include: °· Acid blockers for heartburn. °· Anti-inflammatory medicine. °· Pain medicine for inflammatory conditions. °· Antibiotics if an infection is present. °· You may be advised to change lifestyle habits. This includes stopping smoking and avoiding alcohol, caffeine, and chocolate. °· You may be advised to keep your head raised (elevated) when sleeping. This reduces the chance of acid going backward from your stomach into your esophagus. °· Most of the time, nonspecific chest pain will improve within 2 to 3 days with rest and mild pain medicine. °HOME CARE INSTRUCTIONS  °· If antibiotics were prescribed, take your antibiotics as directed. Finish them even if you start to feel better. °· For the next few days, avoid physical  activities that bring on chest pain. Continue physical activities as directed. °· Do not smoke. °· Avoid drinking alcohol. °· Only take over-the-counter or prescription medicine for pain, discomfort, or fever as directed by your caregiver. °· Follow your caregiver's suggestions for further testing if your chest pain does not go away. °· Keep any follow-up appointments you made. If you do not go to an appointment, you could develop lasting (chronic) problems with pain. If there is any problem keeping an appointment, you must call to reschedule. °SEEK MEDICAL CARE IF:  °· You think you are having problems from the medicine you are taking. Read your medicine instructions carefully. °· Your chest pain does not go away, even after treatment. °· You develop a rash with blisters on your chest. °SEEK IMMEDIATE MEDICAL CARE IF:  °· You have increased chest pain or pain that spreads to your arm, neck, jaw, back, or abdomen. °· You develop shortness of breath, an increasing cough, or you are coughing up blood. °· You have severe back or abdominal pain, feel nauseous, or vomit. °· You develop severe weakness, fainting, or chills. °· You have a fever. °THIS IS AN EMERGENCY. Do not wait to see if the pain will go away. Get medical help at once. Call your local emergency services (911 in [place] Do not drive yourself to the hospital. °MAKE SURE YOU:  °· Understand these instructions. °· Will watch your condition. °· Will get help right away if you are not doing well or get worse. °Document Released: 08/17/2005 Document Revised: 01/30/2012 Document Reviewed: 06/12/2008 °ExitCare® Patient Information ©2014 [employer]   [employer]. ° °

## 2013-07-24 LAB — CMP14+EGFR
ALT: 21 IU/L (ref 0–44)
AST: 19 IU/L (ref 0–40)
Albumin/Globulin Ratio: 2 (ref 1.1–2.5)
Albumin: 4.7 g/dL (ref 3.5–4.8)
Alkaline Phosphatase: 81 IU/L (ref 39–117)
BUN/Creatinine Ratio: 10 (ref 10–22)
BUN: 7 mg/dL — ABNORMAL LOW (ref 8–27)
CO2: 24 mmol/L (ref 18–29)
Calcium: 10.2 mg/dL (ref 8.6–10.2)
Chloride: 97 mmol/L (ref 97–108)
Creatinine, Ser: 0.67 mg/dL — ABNORMAL LOW (ref 0.76–1.27)
GFR calc Af Amer: 113 mL/min/{1.73_m2} (ref >59)
GFR calc non Af Amer: 97 mL/min/{1.73_m2} (ref >59)
Globulin, Total: 2.4 g/dL (ref 1.5–4.5)
Glucose: 190 mg/dL — ABNORMAL HIGH (ref 65–99)
Potassium: 4.7 mmol/L (ref 3.5–5.2)
Sodium: 137 mmol/L (ref 134–144)
Total Bilirubin: 0.3 mg/dL (ref 0.0–1.2)
Total Protein: 7.1 g/dL (ref 6.0–8.5)

## 2013-07-24 LAB — URIC ACID: Uric Acid: 6.3 mg/dL (ref 3.7–8.6)

## 2013-07-24 LAB — THYROID PANEL WITH TSH
Free Thyroxine Index: 1.8 (ref 1.2–4.9)
T3 Uptake Ratio: 27 % (ref 24–39)
T4, Total: 6.7 ug/dL (ref 4.5–12.0)
TSH: 1.24 u[IU]/mL (ref 0.450–4.500)

## 2013-07-24 LAB — PSA, TOTAL AND FREE
PSA, Free Pct: 34.2 %
PSA, Free: 0.65 ng/mL
PSA: 1.9 ng/mL (ref 0.0–4.0)

## 2013-07-26 ENCOUNTER — Other Ambulatory Visit: Payer: Self-pay | Admitting: Family Medicine

## 2013-07-26 ENCOUNTER — Encounter: Payer: Self-pay | Admitting: *Deleted

## 2013-07-26 NOTE — Telephone Encounter (Signed)
Direction on rx say 4 x day for carafate

## 2013-07-30 ENCOUNTER — Ambulatory Visit (INDEPENDENT_AMBULATORY_CARE_PROVIDER_SITE_OTHER): Payer: Medicare Other | Admitting: Family Medicine

## 2013-07-30 ENCOUNTER — Encounter: Payer: Self-pay | Admitting: Family Medicine

## 2013-07-30 VITALS — BP 148/86 | HR 98 | Temp 99.2°F | Ht 66.0 in | Wt 141.8 lb

## 2013-07-30 DIAGNOSIS — K219 Gastro-esophageal reflux disease without esophagitis: Secondary | ICD-10-CM

## 2013-07-30 DIAGNOSIS — M24541 Contracture, right hand: Secondary | ICD-10-CM

## 2013-07-30 DIAGNOSIS — M24549 Contracture, unspecified hand: Secondary | ICD-10-CM

## 2013-07-30 MED ORDER — OMEPRAZOLE 20 MG PO CPDR: 20.0000 mg | DELAYED_RELEASE_CAPSULE | Freq: Every day | ORAL | Status: DC

## 2013-07-30 MED ORDER — RANITIDINE HCL 150 MG PO TABS: 150.0000 mg | ORAL_TABLET | Freq: Two times a day (BID) | ORAL | Status: DC

## 2013-07-30 NOTE — Progress Notes (Signed)
  Subjective:    Patient ID: Perry Kennedy, male    DOB: [birth year], 72 y.o.   MRN: [id number]  HPI This 71 y.o. male presents for evaluation of follow up on GERD and chest discomfort. He has been having worsening GERD sx's and was rx'd some carafate and was unable To afford this.  He wants his nexium changed to a more generic.  He has hx of CAD and  Last visit his wife mentioned she thought he was having chest pain and he didn't think so And he underwent an EKG which showed old MI and he was referred back to cardiology. He has a contracture on his right hand and 5th finger he wants to get evaluated.   Review of Systems No chest pain, SOB, HA, dizziness, vision change, N/V, diarrhea, constipation, dysuria, urinary urgency or frequency, myalgias, arthralgias or rash.     Objective:   Physical Exam  Vital signs noted  Well developed well nourished male.  HEENT - Head atraumatic Normocephalic                Eyes - PERRLA, Conjuctiva - clear Sclera- Clear EOMI                Ears - EAC's Wnl TM's Wnl Gross Hearing WNL                Nose - Nares patent                 Throat - oropharanx wnl Respiratory - Lungs CTA bilateral Cardiac - RRR S1 and S2 without murmur MS - Contracture of the 5th flexor tendon.     Assessment & Plan:  GERD (gastroesophageal reflux disease) - Plan: ranitidine (ZANTAC) 150 MG tablet, omeprazole (PRILOSEC) 20 MG capsule  Contracture of hand joint, right - Plan: Ambulatory referral to Orthopedic Surgery.  CAD - Not currently having chest pain and awaiting cardiology referral.

## 2013-07-30 NOTE — Patient Instructions (Signed)

## 2013-08-01 ENCOUNTER — Telehealth: Payer: Self-pay | Admitting: Family Medicine

## 2013-08-01 NOTE — Telephone Encounter (Signed)
[facility] aware and will call pt to pick up rx.

## 2013-08-01 NOTE — Telephone Encounter (Signed)
[facility] has question about Carafate, four times daily?  Please advise.

## 2013-08-01 NOTE — Telephone Encounter (Signed)
Would like patient to take carafate qac and hs x 2weeks for abdominal pain

## 2013-08-06 NOTE — Telephone Encounter (Signed)
Only see recent call for lab results, patient was notified and copy was sent to patient.  Advised that if they need any further, please contact office.

## 2013-09-11 ENCOUNTER — Encounter: Payer: Self-pay | Admitting: Cardiology

## 2013-09-11 ENCOUNTER — Ambulatory Visit (INDEPENDENT_AMBULATORY_CARE_PROVIDER_SITE_OTHER): Payer: Medicare Other | Admitting: Cardiology

## 2013-09-11 VITALS — BP 161/99 | HR 98 | Ht 66.0 in | Wt 145.0 lb

## 2013-09-11 DIAGNOSIS — I2510 Atherosclerotic heart disease of native coronary artery without angina pectoris: Secondary | ICD-10-CM

## 2013-09-11 DIAGNOSIS — R0989 Other specified symptoms and signs involving the circulatory and respiratory systems: Secondary | ICD-10-CM

## 2013-09-11 NOTE — Progress Notes (Signed)
HPI The patient presents for evaluation of chest pain. He has a history of coronary artery disease. Catheterization in 2007 demonstrated very heavily calcified vessels with nonobstructive disease in the major vessels. He did have a large to moderate size diagonal with ostial 80% stenosis it was managed medically. The patient has not participated in risk reduction. He did have a stress perfusion study in 2010 that demonstrated no high-risk findings. He has continued to smoke cigarettes. He is referred back because he does get some chest discomfort. It is clear he doesn't really want to be here and he is vague in the description of his pain. His biggest complaint is leg pain which limits him walking a short incline. This has stopped him from doing much activity. He's not describing resting leg discomfort. He does have some dyspnea with exertion but this is chronic. He's not describing PND or orthopnea. He's not had any palpitations, presyncope or syncope. He does however occasionally get chest discomfort. He says this is sporadic and feels like possible reflux. He doesn't think it happens with activity but again he doesn't push himself. 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 He does not have sublingual nitroglycerin. He does not describe jaw or arm discomfort. He has not had associated symptoms such as nausea vomiting or diaphoresis.  No Known Allergies  Current Outpatient Prescriptions  Medication Sig Dispense Refill  . atorvastatin (LIPITOR) 40 MG tablet Take 1 tablet (40 mg total) by mouth daily.  30 tablet  2  . cilostazol (PLETAL) 100 MG tablet Take 1 tablet (100 mg total) by mouth 2 (two) times daily.  60 tablet  2  . diclofenac (VOLTAREN) 75 MG EC tablet Take 1 tablet (75 mg total) by mouth 2 (two) times daily.  60 tablet  3  . lisinopril (PRINIVIL,ZESTRIL) 20 MG tablet  Take 1 tablet (20 mg total) by mouth daily.  30 tablet  2  . metFORMIN (GLUCOPHAGE) 500 MG tablet Take 1 tablet (500 mg total) by mouth 2 (two) times daily with a meal.  60 tablet  2  . omeprazole (PRILOSEC) 20 MG capsule Take 1 capsule (20 mg total) by mouth daily.  30 capsule  5  . ranitidine (ZANTAC) 150 MG tablet Take 1 tablet (150 mg total) by mouth 2 (two) times daily.  60 tablet  5   No current facility-administered medications for this visit.    Past Medical History  Diagnosis Date  . Diabetes mellitus without complication   . Hyperlipidemia   . GERD (gastroesophageal reflux disease)   . Hypertension   . COPD (chronic obstructive pulmonary disease)   . Insomnia   . Gout     No past surgical history on file.  No family history on file.  History   Social History  . Marital Status: Divorced    Spouse Name: N/A    Number of Children: N/A  . Years of Education: N/A   Occupational History  . Not on file.   Social History Main Topics  . Smoking status: Current Every Day Smoker    Types: Cigarettes  . Smokeless tobacco: Not on file  . Alcohol Use: No  . Drug Use: No  . Sexual Activity: Not on file   Other Topics Concern  . Not on file   Social History Narrative  . No narrative on file  ROS:  Cough, constipation.  Otherwise as stated in the HPI and negative for all other systems.    PHYSICAL EXAM BP 161/99  Pulse 98  Ht 5\' 6"  (1.676 m)  Wt 145 lb (65.772 kg)  BMI 23.41 kg/m2 GENERAL:  Well appearing HEENT:  Pupils equal round and reactive, fundi not visualized, oral mucosa unremarkable NECK:  No jugular venous distention, waveform within normal limits, carotid upstroke brisk and symmetric, bilateral carotid bruits, no thyromegaly LYMPHATICS:  No cervical, inguinal adenopathy LUNGS:  Clear to auscultation bilaterally BACK:  No CVA tenderness CHEST:  Unremarkable HEART:  PMI not displaced or sustained,S1 and S2 within normal limits, no S3, no S4, no  clicks, no rubs, no murmurs ABD:  Flat, positive bowel sounds normal in frequency in pitch, positive midline and bilateral bruits, no rebound, no guarding, no midline pulsatile mass, no hepatomegaly, no splenomegaly EXT:  2 plus pulses upper, absent DP/PT bilateral, no edema, no cyanosis no clubbing SKIN:  No rashes no nodules NEURO:  Cranial nerves II through XII grossly intact, motor grossly intact throughout PSYCH:  Cognitively intact, oriented to person place and time   EKG:  Saw her in rate 93, axis within normal limits, intervals within normal limits, poor anterior R wave progression suggestive of an old anteroseptal infarct.  ASSESSMENT AND PLAN  CAD:  Given the ongoing risk factors and in particular that tobacco abuse he certainly could have progression of his coronary disease. He needs stress perfusion study but wouldn't be a walk on a treadmill. Therefore, he will have a [employer].  PVD:  I suspect severe lower extremity disease. This is very symptomatic and limits his walking. He agrees to an appointment with Dr. Kirke Corin to discuss the next imaging study and possible treatment. He knows he is to stop smoking.  BRUITS:  I will arrange carotid Dopplers.  TOBACCO:  He has no desire at this point to quit smoking and we have talked about this.  HTN:  This is an unusually high readings. For now he will continue therapies as listed although I see this is continuing to be elevated he might need adjustment of his medications. Also check in the future renal artery Dopplers as he has bilateral bruits in that distribution.

## 2013-09-11 NOTE — Patient Instructions (Addendum)
The current medical regimen is effective;  continue present plan and medications.  Your physician has requested that you have a carotid duplex. This test is an ultrasound of the carotid arteries in your neck. It looks at blood flow through these arteries that supply the brain with blood. Allow one hour for this exam. There are no restrictions or special instructions.  Your physician has requested that you have a lexiscan myoview. For further information please visit [email or web address] Please follow instruction sheet, as given.  You have been refered to see Dr Kirke Corin to evaluated the pain in your legs.  Follow up with Dr Antoine Poche after all testing has been completed.

## 2013-09-25 ENCOUNTER — Encounter: Payer: Self-pay | Admitting: Cardiology

## 2013-10-03 ENCOUNTER — Encounter: Payer: Self-pay | Admitting: Cardiology

## 2013-10-03 ENCOUNTER — Ambulatory Visit (INDEPENDENT_AMBULATORY_CARE_PROVIDER_SITE_OTHER): Payer: Medicare Other | Admitting: Family Medicine

## 2013-10-03 ENCOUNTER — Encounter: Payer: Self-pay | Admitting: Family Medicine

## 2013-10-03 ENCOUNTER — Ambulatory Visit (HOSPITAL_COMMUNITY): Payer: Medicare Other | Attending: Cardiology | Admitting: Radiology

## 2013-10-03 ENCOUNTER — Encounter (HOSPITAL_COMMUNITY): Payer: Self-pay

## 2013-10-03 VITALS — BP 144/84 | HR 114 | Temp 97.4°F | Ht 66.0 in | Wt 142.6 lb

## 2013-10-03 VITALS — BP 143/80 | Ht 66.0 in | Wt 142.0 lb

## 2013-10-03 DIAGNOSIS — R0989 Other specified symptoms and signs involving the circulatory and respiratory systems: Secondary | ICD-10-CM | POA: Insufficient documentation

## 2013-10-03 DIAGNOSIS — E119 Type 2 diabetes mellitus without complications: Secondary | ICD-10-CM | POA: Insufficient documentation

## 2013-10-03 DIAGNOSIS — I10 Essential (primary) hypertension: Secondary | ICD-10-CM | POA: Insufficient documentation

## 2013-10-03 DIAGNOSIS — G8929 Other chronic pain: Secondary | ICD-10-CM

## 2013-10-03 DIAGNOSIS — R1031 Right lower quadrant pain: Secondary | ICD-10-CM

## 2013-10-03 DIAGNOSIS — R0602 Shortness of breath: Secondary | ICD-10-CM | POA: Insufficient documentation

## 2013-10-03 DIAGNOSIS — R079 Chest pain, unspecified: Secondary | ICD-10-CM | POA: Insufficient documentation

## 2013-10-03 DIAGNOSIS — F17200 Nicotine dependence, unspecified, uncomplicated: Secondary | ICD-10-CM | POA: Insufficient documentation

## 2013-10-03 DIAGNOSIS — Z8249 Family history of ischemic heart disease and other diseases of the circulatory system: Secondary | ICD-10-CM | POA: Insufficient documentation

## 2013-10-03 DIAGNOSIS — R0609 Other forms of dyspnea: Secondary | ICD-10-CM | POA: Insufficient documentation

## 2013-10-03 DIAGNOSIS — I2510 Atherosclerotic heart disease of native coronary artery without angina pectoris: Secondary | ICD-10-CM | POA: Insufficient documentation

## 2013-10-03 LAB — POCT CBC
Granulocyte percent: 81.3 %G — AB (ref 37–80)
HCT, POC: 47.4 % (ref 43.5–53.7)
Hemoglobin: 15.6 g/dL (ref 14.1–18.1)
Lymph, poc: 1.9 (ref 0.6–3.4)
MCH, POC: 27.5 pg (ref 27–31.2)
MCHC: 33 g/dL (ref 31.8–35.4)
MCV: 83.3 fL (ref 80–97)
MPV: 5.8 fL (ref 0–99.8)
POC Granulocyte: 11.2 — AB (ref 2–6.9)
POC LYMPH PERCENT: 14.1 %L (ref 10–50)
Platelet Count, POC: 373 10*3/uL (ref 142–424)
RBC: 5.7 M/uL (ref 4.69–6.13)
RDW, POC: 14.5 %
WBC: 13.8 10*3/uL — AB (ref 4.6–10.2)

## 2013-10-03 MED ORDER — REGADENOSON 0.4 MG/5ML IV SOLN
0.4000 mg | Freq: Once | INTRAVENOUS | Status: AC
  Administered 2013-10-03: 0.4 mg via INTRAVENOUS

## 2013-10-03 MED ORDER — TECHNETIUM TC 99M SESTAMIBI GENERIC - CARDIOLITE
11.0000 | Freq: Once | INTRAVENOUS | Status: AC | PRN
  Administered 2013-10-03: 11 via INTRAVENOUS

## 2013-10-03 MED ORDER — TECHNETIUM TC 99M SESTAMIBI GENERIC - CARDIOLITE
33.0000 | Freq: Once | INTRAVENOUS | Status: AC | PRN
  Administered 2013-10-03: 33 via INTRAVENOUS

## 2013-10-03 NOTE — Progress Notes (Signed)
[facility] SITE 3 NUCLEAR MED [address] [place], [place] [address] [phone]    Cardiology Nuclear Med Study  Perry Kennedy is a 72 y.o. male     MRN : [id number]     DOB: 11/01/1941  Procedure Date: 10/03/2013  Nuclear Med Background Indication for Stress Test:  Evaluation for Ischemia History:  PREVIOUS NUCLEAR STUDY ,10'/adenosine/ no hih risk fidings per D.:CAD Cardiac Risk Factors: Family History - CAD, Hypertension, Lipids, NIDDM and Smoker  Symptoms:  Chest Pain, DOE and SOB   Nuclear Pre-Procedure Caffeine/Decaff Intake:  None > 12 hrs NPO After: 8:00pm   Lungs:  clear O2 Sat: 94% on room air. IV 0.9% NS with Angio Cath:  20g  IV Site: R Antecubital x 1, tolerated well IV Started by:  Irean Hong, RN  Chest Size (in):  38 Cup Size: n/a  Height: 5\' 6"  (1.676 m)  Weight:  142 lb (64.411 kg)  BMI:  Body mass index is 22.93 kg/(m^2). Tech Comments:  No medications today    Nuclear Med Study 1 or 2 day study: 1 day  Stress Test Type:  Lexiscan  Reading MD: Cassell Clement, MD  Order Authorizing Provider:  Rollene Rotunda, MD  Resting Radionuclide: Technetium 62m Sestamibi  Resting Radionuclide Dose: 11.0 mCi   Stress Radionuclide:  Technetium 20m Sestamibi  Stress Radionuclide Dose: 33.0 mCi           Stress Protocol Rest HR: 102 Stress HR: 126  Rest BP: 143/80 Stress BP: 163/87  Exercise Time (min): n/a METS: n/a   Predicted Max HR: 78 bpm % Max HR: 161.54 bpm Rate Pressure Product: 21308   Dose of Adenosine (mg):  n/a Dose of Lexiscan: 0.4 mg  Dose of Atropine (mg): n/a Dose of Dobutamine: n/a mcg/kg/min (at max HR)  Stress Test Technologist: Frederick Peers, EMT-P  Nuclear Technologist:  Domenic Polite, CNMT     Rest Procedure:  Myocardial perfusion imaging was performed at rest 45 minutes following the intravenous administration of Technetium 36m Sestamibi. Rest ECG: NSR - Normal EKG  Stress Procedure:  The patient received IV  Lexiscan 0.4 mg over 15-seconds.  Technetium 70m Sestamibi injected at 30-seconds.  Quantitative spect images were obtained after a 45 minute delay. Stress ECG: No significant change from baseline ECG  QPS Raw Data Images:  There is interference from nuclear activity from structures below the diaphragm. This does not affect the ability to read the study. Stress Images:  Decreased inferior wall uptake. Rest Images:  Decreased inferior wall uptake Subtraction (SDS):  There is a fixed inferior defect that is most consistent with diaphragmatic attenuation. No reversible ischemia. Transient Ischemic Dilatation (Normal <1.22):  1.27 Lung/Heart Ratio (Normal <0.45):  0.28  Quantitative Gated Spect Images QGS EDV:  60 ml QGS ESV:  31 ml  Impression Exercise Capacity:  Lexiscan with no exercise. BP Response:  Normal blood pressure response. Clinical Symptoms:  No chest pain. ECG Impression:  No significant ST segment change suggestive of ischemia. Comparison with Prior Nuclear Study: On prior study 2010 diaphragmatic attenuation was not seen. EF 67% on prior study.  Overall Impression:  Low risk stress nuclear study.   No reversible ischemia.  Diaphragmatic attenuation is present. Cannot exclude old inferior wall MI.  LV Ejection Fraction: 48%.  LV Wall Motion:  Normal Wall Motion. Since 2010 EF has dropped from 67% to 48%.  Cassell Clement

## 2013-10-03 NOTE — Progress Notes (Signed)
Patient ID: Perry Kennedy, male   DOB: 1941/07/03, 72 y.o.   MRN: [id number] Patient here for a Lexiscan Cardiolite. Patient complaining of intermittent RLQ abdominal pain with nausea, no vomiting x 2 weeks. The pain woke him up at 4:00 am today and the patient rated the pain a 10/10. The patient has a history of kidney stones, and has his appendix.On arrival here the pain was a 5/10. The patient wants to complete the nuclear study.  I discussed with Dr. Fayrene Fearing Hochrein's nurse Avie Arenas) who recommended to complete the test, and have the patient follow-up with his PCP.  Dr.Donald Moore's office notified and appointment made for 4:00 pm today,but can come after our test completed. If pain increases go to ED per Dr. Kathi Der nurse.The nuclear study performed and the patients  RLQ pain remains 5/10. Patient given the option to go to ED.The patient left at 2:30 pm and preferred to  go to Dr. Kathi Der office now.  Irean Hong, RN.

## 2013-10-03 NOTE — Patient Instructions (Signed)
Appendicitis °Appendicitis is when the appendix is swollen (inflamed). The inflammation can lead to developing a hole (perforation) and a collection of pus (abscess). °CAUSES  °There is not always an obvious cause of appendicitis. Sometimes it is caused by an obstruction in the appendix. The obstruction can be caused by: °· A small, hard, pea-sized ball of stool (fecalith). °· Enlarged lymph glands in the appendix. °SYMPTOMS  °· Pain around your belly button (navel) that moves toward your lower right belly (abdomen). The pain can become more severe and sharp as time passes. °· Tenderness in the lower right abdomen. Pain gets worse if you cough or make a sudden movement. °· Feeling sick to your stomach (nauseous). °· Throwing up (vomiting). °· Loss of appetite. °· Fever. °· Constipation. °· Diarrhea. °· Generally not feeling well. °DIAGNOSIS  °· Physical exam. °· Blood tests. °· Urine test. °· X-rays or a CT scan may confirm the diagnosis. °TREATMENT  °Once the diagnosis of appendicitis is made, the most common treatment is to remove the appendix as soon as possible. This procedure is called appendectomy. In an open appendectomy, a cut (incision) is made in the lower right abdomen and the appendix is removed. In a laparoscopic appendectomy, usually 3 small incisions are made. Long, thin instruments and a camera tube are used to remove the appendix. Most patients go home in 24 to 48 hours after appendectomy. °In some situations, the appendix may have already perforated and an abscess may have formed. The abscess may have a "wall" around it as seen on a CT scan. In this case, a drain may be placed into the abscess to remove fluid, and you may be treated with antibiotic medicines that kill germs. The medicine is given through a tube in your vein (IV). Once the abscess has resolved, it may or may not be necessary to have an appendectomy. You may need to stay in the hospital longer than 48 hours. °Document Released:  11/07/2005 Document Revised: 05/08/2012 Document Reviewed: 02/02/2010 °ExitCare® Patient Information ©2014 [employer]. ° °

## 2013-10-03 NOTE — Progress Notes (Signed)
  Subjective:    Patient ID: Perry Kennedy, male    DOB: 04-11-1941, 72 y.o.   MRN: [id number]  Abdominal Pain This is a new problem. The current episode started 1 to 4 weeks ago (two weeks ago). The onset quality is sudden. The problem occurs constantly. The problem has been waxing and waning. The pain is located in the RUQ. The pain is at a severity of 5/10. The pain is moderate. The quality of the pain is aching. The abdominal pain radiates to the back. Associated symptoms include nausea and vomiting. Pertinent negatives include no constipation or diarrhea. The pain is aggravated by certain positions. The pain is relieved by sitting up. He has tried acetaminophen for the symptoms. The treatment provided no relief. His past medical history is significant for GERD.      Review of Systems  Gastrointestinal: Positive for nausea, vomiting and abdominal pain. Negative for diarrhea and constipation.  All other systems reviewed and are negative.       Objective:   Physical Exam  Vitals reviewed. Constitutional: He is oriented to person, place, and time. He appears well-developed and well-nourished.  Cardiovascular: Normal rate, regular rhythm, normal heart sounds and intact distal pulses.   No murmur heard. Pulmonary/Chest: Effort normal and breath sounds normal. No respiratory distress.  Abdominal: Soft. Bowel sounds are normal. There is tenderness (RLQ). There is guarding.  Bruit heard in RQ  Musculoskeletal: Normal range of motion.  Neurological: He is alert and oriented to person, place, and time.  Skin: Skin is warm and dry.  Psychiatric: He has a normal mood and affect. His behavior is normal. Judgment and thought content normal.   BP 144/84  Pulse 114  Temp(Src) 97.4 F (36.3 C) (Oral)  Ht 5\' 6"  (1.676 m)  Wt 142 lb 9.6 oz (64.683 kg)  BMI 23.03 kg/m2        Assessment & Plan:  Abdominal pain, chronic, right lower quadrant - Plan: CT Abdomen Pelvis W Contrast, POCT  CBC Get CT of abdomen and pelvis and if surgical abdomen will need to go to ED. If no surgical abdomen then follow up tomorrow.  Abdominal bruit - Plan: CT Abdomen Pelvis W Contrast, POCT CBC  Deatra Canter FNP

## 2013-10-04 ENCOUNTER — Ambulatory Visit (INDEPENDENT_AMBULATORY_CARE_PROVIDER_SITE_OTHER): Payer: Medicare Other | Admitting: Family Medicine

## 2013-10-04 ENCOUNTER — Encounter: Payer: Self-pay | Admitting: Family Medicine

## 2013-10-04 VITALS — BP 132/74 | HR 97 | Temp 96.9°F | Wt 145.4 lb

## 2013-10-04 DIAGNOSIS — R0989 Other specified symptoms and signs involving the circulatory and respiratory systems: Secondary | ICD-10-CM

## 2013-10-04 DIAGNOSIS — R109 Unspecified abdominal pain: Secondary | ICD-10-CM

## 2013-10-04 MED ORDER — CIPROFLOXACIN HCL 500 MG PO TABS: 500.0000 mg | ORAL_TABLET | Freq: Two times a day (BID) | ORAL | Status: DC

## 2013-10-04 MED ORDER — METRONIDAZOLE 500 MG PO TABS: 500.0000 mg | ORAL_TABLET | Freq: Three times a day (TID) | ORAL | Status: DC

## 2013-10-07 NOTE — Progress Notes (Signed)
  Subjective:    Patient ID: Perry Kennedy, male    DOB: [birth year], 72 y.o.   MRN: [id number]  HPI This 72 y.o. male presents for evaluation of RLQ abdominal pain.  He underwent a CT of the  Abdomen for RLQ abdominal pain and the CT showed diverticulosis but not appendicitis. He is doing better today and not having the same intensity of pain as he was having yesterday. His CT did indicate a lot of plaque along the aorta and renal arteries.   Review of Systems C/o abdominal pain. No chest pain, SOB, HA, dizziness, vision change, N/V, diarrhea, constipation, dysuria, urinary urgency or frequency, myalgias, arthralgias or rash.     Objective:   Physical Exam  Vital signs noted  Well developed well nourished male.  HEENT - Head atraumatic Normocephalic                Eyes - PERRLA, Conjuctiva - clear Sclera- Clear EOMI Respiratory - Lungs CTA bilateral Cardiac - RRR S1 and S2 without murmur GI - Abdomen soft and tender right lower quadrant w/o guarding. Vascular - Bruit right abdomen.     Assessment & Plan:  Abdominal pain, unspecified site - Plan: ciprofloxacin (CIPRO) 500 MG tablet, metroNIDAZOLE (FLAGYL) 500 MG tablet  Abdominal bruit - Plan: US Renal Artery Stenosis, [place] TRANS ABDOMINAL AMNIOINFUSION  Deatra Canter FNP

## 2013-10-07 NOTE — Patient Instructions (Signed)
Diverticulitis °A diverticulum is a small pouch or sac on the colon. Diverticulosis is the presence of these diverticula on the colon. Diverticulitis is the irritation (inflammation) or infection of diverticula. °CAUSES  °The colon and its diverticula contain bacteria. If food particles block the tiny opening to a diverticulum, the bacteria inside can grow and cause an increase in pressure. This leads to infection and inflammation and is called diverticulitis. °SYMPTOMS  °· Abdominal pain and tenderness. Usually, the pain is located on the left side of your abdomen. However, it could be located elsewhere. °· Fever. °· Bloating. °· Feeling sick to your stomach (nausea). °· Throwing up (vomiting). °· Abnormal stools. °DIAGNOSIS  °Your caregiver will take a history and perform a physical exam. Since many things can cause abdominal pain, other tests may be necessary. Tests may include: °· Blood tests. °· Urine tests. °· X-ray of the abdomen. °· CT scan of the abdomen. °Sometimes, surgery is needed to determine if diverticulitis or other conditions are causing your symptoms. °TREATMENT  °Most of the time, you can be treated without surgery. Treatment includes: °· Resting the bowels by only having liquids for a few days. As you improve, you will need to eat a low-fiber diet. °· Intravenous (IV) fluids if you are losing body fluids (dehydrated). °· Antibiotic medicines that treat infections may be given. °· Pain and nausea medicine, if needed. °· Surgery if the inflamed diverticulum has burst. °HOME CARE INSTRUCTIONS  °· Try a clear liquid diet (broth, tea, or water for as long as directed by your caregiver). You may then gradually begin a low-fiber diet as tolerated.  °A low-fiber diet is a diet with less than 10 grams of fiber. Choose the foods below to reduce fiber in the diet: °· White breads, cereals, rice, and pasta. °· Cooked fruits and vegetables or soft fresh fruits and vegetables without the skin. °· Ground or  well-cooked tender beef, ham, veal, lamb, pork, or poultry. °· Eggs and seafood. °· After your diverticulitis symptoms have improved, your caregiver may put you on a high-fiber diet. A high-fiber diet includes 14 grams of fiber for every 1000 calories consumed. For a standard 2000 calorie diet, you would need 28 grams of fiber. Follow these diet guidelines to help you increase the fiber in your diet. It is important to slowly increase the amount fiber in your diet to avoid gas, constipation, and bloating. °· Choose whole-grain breads, cereals, pasta, and brown rice. °· Choose fresh fruits and vegetables with the skin on. Do not overcook vegetables because the more vegetables are cooked, the more fiber is lost. °· Choose more nuts, seeds, legumes, dried peas, beans, and lentils. °· Look for food products that have greater than 3 grams of fiber per serving on the Nutrition Facts label. °· Take all medicine as directed by your caregiver. °· If your caregiver has given you a follow-up appointment, it is very important that you go. Not going could result in lasting (chronic) or permanent injury, pain, and disability. If there is any problem keeping the appointment, call to reschedule. °SEEK MEDICAL CARE IF:  °· Your pain does not improve. °· You have a hard time advancing your diet beyond clear liquids. °· Your bowel movements do not return to normal. °SEEK IMMEDIATE MEDICAL CARE IF:  °· Your pain becomes worse. °· You have an oral temperature above 102° F (38.9° C), not controlled by medicine. °· You have repeated vomiting. °· You have bloody or black, tarry stools. °·   Symptoms that brought you to your caregiver become worse or are not getting better. °MAKE SURE YOU:  °· Understand these instructions. °· Will watch your condition. °· Will get help right away if you are not doing well or get worse. °Document Released: 08/17/2005 Document Revised: 01/30/2012 Document Reviewed: 12/13/2010 °ExitCare® Patient Information  ©2014 [employer]. ° °

## 2013-10-08 ENCOUNTER — Ambulatory Visit (INDEPENDENT_AMBULATORY_CARE_PROVIDER_SITE_OTHER): Payer: Medicare Other | Admitting: Cardiovascular Disease

## 2013-10-08 ENCOUNTER — Encounter: Payer: Self-pay | Admitting: Cardiology

## 2013-10-08 ENCOUNTER — Telehealth: Payer: Self-pay | Admitting: Cardiology

## 2013-10-08 ENCOUNTER — Ambulatory Visit (HOSPITAL_COMMUNITY): Payer: Medicare Other | Attending: Cardiology

## 2013-10-08 ENCOUNTER — Other Ambulatory Visit: Payer: Self-pay | Admitting: Family Medicine

## 2013-10-08 ENCOUNTER — Encounter: Payer: Self-pay | Admitting: *Deleted

## 2013-10-08 ENCOUNTER — Ambulatory Visit (HOSPITAL_COMMUNITY): Payer: Medicare Other | Attending: Cardiovascular Disease

## 2013-10-08 ENCOUNTER — Encounter: Payer: Self-pay | Admitting: Cardiovascular Disease

## 2013-10-08 VITALS — BP 160/83 | HR 101 | Ht 68.0 in | Wt 146.4 lb

## 2013-10-08 DIAGNOSIS — M79609 Pain in unspecified limb: Secondary | ICD-10-CM | POA: Insufficient documentation

## 2013-10-08 DIAGNOSIS — I714 Abdominal aortic aneurysm, without rupture, unspecified: Secondary | ICD-10-CM

## 2013-10-08 DIAGNOSIS — I2510 Atherosclerotic heart disease of native coronary artery without angina pectoris: Secondary | ICD-10-CM | POA: Insufficient documentation

## 2013-10-08 DIAGNOSIS — E785 Hyperlipidemia, unspecified: Secondary | ICD-10-CM | POA: Insufficient documentation

## 2013-10-08 DIAGNOSIS — I708 Atherosclerosis of other arteries: Secondary | ICD-10-CM | POA: Insufficient documentation

## 2013-10-08 DIAGNOSIS — I70219 Atherosclerosis of native arteries of extremities with intermittent claudication, unspecified extremity: Secondary | ICD-10-CM

## 2013-10-08 DIAGNOSIS — I739 Peripheral vascular disease, unspecified: Secondary | ICD-10-CM

## 2013-10-08 DIAGNOSIS — F17200 Nicotine dependence, unspecified, uncomplicated: Secondary | ICD-10-CM | POA: Insufficient documentation

## 2013-10-08 DIAGNOSIS — I10 Essential (primary) hypertension: Secondary | ICD-10-CM

## 2013-10-08 DIAGNOSIS — I700 Atherosclerosis of aorta: Secondary | ICD-10-CM

## 2013-10-08 DIAGNOSIS — R0989 Other specified symptoms and signs involving the circulatory and respiratory systems: Secondary | ICD-10-CM

## 2013-10-08 DIAGNOSIS — I771 Stricture of artery: Secondary | ICD-10-CM | POA: Insufficient documentation

## 2013-10-08 DIAGNOSIS — I6529 Occlusion and stenosis of unspecified carotid artery: Secondary | ICD-10-CM

## 2013-10-08 LAB — CBC WITH DIFFERENTIAL/PLATELET
Basophils Relative: 0.3 % (ref 0.0–3.0)
Eosinophils Relative: 3.3 % (ref 0.0–5.0)
HCT: 41 % (ref 39.0–52.0)
Lymphocytes Relative: 13 % (ref 12.0–46.0)
MCV: 84.9 fl (ref 78.0–100.0)
Monocytes Absolute: 0.4 10*3/uL (ref 0.1–1.0)
Neutrophils Relative %: 79 % — ABNORMAL HIGH (ref 43.0–77.0)
RBC: 4.83 Mil/uL (ref 4.22–5.81)
WBC: 10 10*3/uL (ref 4.5–10.5)

## 2013-10-08 LAB — BASIC METABOLIC PANEL
Calcium: 9.9 mg/dL (ref 8.4–10.5)
Chloride: 94 mEq/L — ABNORMAL LOW (ref 96–112)
Creatinine, Ser: 0.8 mg/dL (ref 0.4–1.5)
Sodium: 127 mEq/L — ABNORMAL LOW (ref 135–145)

## 2013-10-08 LAB — PROTIME-INR: INR: 1 ratio (ref 0.8–1.0)

## 2013-10-08 NOTE — Patient Instructions (Addendum)
Your physician recommends that you return for lab work today :BMET,CBC,INR  Your physician has requested that you have a peripheral vascular angiogram. This exam is performed at the hospital. During this exam IV contrast is used to look at arterial blood flow. Please review the information sheet given for details.  Your physician has requested that you have a lower extremity venous duplex before angiogram which is the 42 th of November . This test is an ultrasound of the veins in the legs or arms. It looks at venous blood flow that carries blood from the heart to the legs or arms. Allow one hour for a Lower Venous exam. Allow thirty minutes for an Upper Venous exam. There are no restrictions or special instructions.

## 2013-10-08 NOTE — Assessment & Plan Note (Signed)
I discussed with him the importance of smoking cessation. He does not think he can quit. He started smoking when he was a kid.

## 2013-10-08 NOTE — Assessment & Plan Note (Signed)
His blood pressure is elevated today. He is at significant risk for renal artery stenosis which will be evaluated at the time of angiography.

## 2013-10-08 NOTE — Telephone Encounter (Signed)
New Problem:  Pt's daughter calling for test results. Please advise

## 2013-10-08 NOTE — Assessment & Plan Note (Signed)
The patient has severe lifestyle limiting claudication affecting both calves in spite of medical therapy with Pletal. He had few occasions of rest discomfort as well. There is no lower extremity ulceration. I suspect that he has multilevel disease based on his physical exam. The left femoral pulses not palpable in the right femoral pulse is weak.  Due to severity of his symptoms, I recommend proceeding with abdominal aortogram, lower extremity runoff and possible angioplasty. Risks, benefits and alternatives were discussed with him. I will arrange for lower extremity Doppler and duplex before angiography.

## 2013-10-08 NOTE — Telephone Encounter (Signed)
Left message for Perry Kennedy to call back. 

## 2013-10-08 NOTE — Progress Notes (Signed)
Primary care physician: Dr. Christell Constant Primary cardiologist: Dr. Antoine Poche  HPI This is a 72 year old male who was referred by Dr. Antoine Poche for evaluation of peripheral arterial disease.  He has known history of coronary artery disease. Catheterization in 2007 demonstrated very heavily calcified vessels with nonobstructive disease in the major vessels. He did have a large to moderate size diagonal with ostial 80% stenosis it was managed medically. He had recent atypical chest pain. He underwent a pharmacologic nuclear stress test which showed no evidence of ischemia with slightly reduced LV systolic function. He reports no further episodes of chest discomfort. Biggest complaint at this time and bilateral calf discomfort which started years ago with suspicion for peripheral arterial disease. He has been on Pletal for many years. He has noticed significant worsening over the last 6-12 months. He now gets bilateral calf discomfort even after walking short distance in the hallway. The discomfort is usually severe and forces him to stop before he can walk again. He had few episodes of discomfort at rest especially at night. He denies significant thigh or buttock discomfort. He continues to smoke one pack per day. No lower extremity ulceration.  No Known Allergies  Current Outpatient Prescriptions  Medication Sig Dispense Refill  . atorvastatin (LIPITOR) 40 MG tablet Take 1 tablet (40 mg total) by mouth daily.  30 tablet  2  . cilostazol (PLETAL) 100 MG tablet Take 1 tablet (100 mg total) by mouth 2 (two) times daily.  60 tablet  2  . ciprofloxacin (CIPRO) 500 MG tablet Take 1 tablet (500 mg total) by mouth 2 (two) times daily.  20 tablet  0  . diclofenac (VOLTAREN) 75 MG EC tablet Take 1 tablet (75 mg total) by mouth 2 (two) times daily.  60 tablet  3  . lisinopril (PRINIVIL,ZESTRIL) 20 MG tablet Take 1 tablet (20 mg total) by mouth daily.  30 tablet  2  . metFORMIN (GLUCOPHAGE) 500 MG tablet Take 1 tablet  (500 mg total) by mouth 2 (two) times daily with a meal.  60 tablet  2  . metroNIDAZOLE (FLAGYL) 500 MG tablet Take 1 tablet (500 mg total) by mouth 3 (three) times daily.  21 tablet  0  . omeprazole (PRILOSEC) 20 MG capsule Take 1 capsule (20 mg total) by mouth daily.  30 capsule  5  . ranitidine (ZANTAC) 150 MG tablet Take 1 tablet (150 mg total) by mouth 2 (two) times daily.  60 tablet  5   No current facility-administered medications for this visit.    Past Medical History  Diagnosis Date  . Diabetes mellitus without complication   . Hyperlipidemia   . GERD (gastroesophageal reflux disease)   . Hypertension   . COPD (chronic obstructive pulmonary disease)   . Insomnia   . Gout     Past Surgical History  Procedure Laterality Date  . Hemicolectomy      Family History  Problem Relation Age of Onset  . Heart disease Father 10    pacemaker  . Diabetes Mother     renal failure  . CAD Brother     died at 35    History   Social History  . Marital Status: Divorced    Spouse Name: N/A    Number of Children: 3  . Years of Education: N/A   Occupational History  . Not on file.   Social History Main Topics  . Smoking status: Current Every Day Smoker -- 2.00 packs/day for 55 years  Types: Cigarettes  . Smokeless tobacco: Not on file  . Alcohol Use: No  . Drug Use: No  . Sexual Activity: Not on file   Other Topics Concern  . Not on file   Social History Narrative   Lives alone.     ROS:  Cough, constipation.  Otherwise as stated in the HPI and negative for all other systems.    PHYSICAL EXAM BP 160/83  Pulse 101  Ht 5\' 8"  (1.727 m)  Wt 146 lb 6.4 oz (66.407 kg)  BMI 22.27 kg/m2 GENERAL:  Well appearing HEENT:  Pupils equal round and reactive, fundi not visualized, oral mucosa unremarkable NECK:  No jugular venous distention, waveform within normal limits, carotid upstroke brisk and symmetric, bilateral carotid bruits, no thyromegaly LYMPHATICS:  No  cervical, inguinal adenopathy LUNGS:  Clear to auscultation bilaterally BACK:  No CVA tenderness CHEST:  Unremarkable HEART:  PMI not displaced or sustained,S1 and S2 within normal limits, no S3, no S4, no clicks, no rubs, no murmurs ABD:  Flat, positive bowel sounds normal in frequency in pitch, positive midline and bilateral bruits, no rebound, no guarding, no midline pulsatile mass, no hepatomegaly, no splenomegaly EXT:  2 plus pulses upper, absent DP/PT bilateral, no edema, no cyanosis no clubbing SKIN:  No rashes no nodules NEURO:  Cranial nerves II through XII grossly intact, motor grossly intact throughout PSYCH:  Cognitively intact, oriented to person place and time Vascular: Femoral pulse: Mildly diminished on the right side with a bruit and absent on the left side. Distal pulses are not palpable. Both feet are extremely cold with dependent rubor.     ASSESSMENT AND PLAN

## 2013-10-08 NOTE — Assessment & Plan Note (Signed)
He reports resolution of chest pain. Recent nuclear stress test showed no evidence of ischemia.

## 2013-10-09 ENCOUNTER — Encounter (HOSPITAL_COMMUNITY): Payer: Self-pay | Admitting: Pharmacy Technician

## 2013-10-09 NOTE — Telephone Encounter (Signed)
Follow Up:  Pt's daughter states she is calling back in regards to test results. Please advise

## 2013-10-09 NOTE — Telephone Encounter (Signed)
The [employer] customer you called is not available at this time

## 2013-10-11 ENCOUNTER — Other Ambulatory Visit: Payer: Self-pay | Admitting: Family Medicine

## 2013-10-11 ENCOUNTER — Other Ambulatory Visit: Payer: Self-pay | Admitting: *Deleted

## 2013-10-11 ENCOUNTER — Ambulatory Visit (HOSPITAL_COMMUNITY)
Admission: RE | Admit: 2013-10-11 | Discharge: 2013-10-11 | Disposition: A | Discharge status: Home / Self Care | Payer: Medicare Other | Source: Ambulatory Visit | Attending: Family Medicine | Admitting: Family Medicine

## 2013-10-11 DIAGNOSIS — I25810 Atherosclerosis of coronary artery bypass graft(s) without angina pectoris: Secondary | ICD-10-CM

## 2013-10-11 DIAGNOSIS — I714 Abdominal aortic aneurysm, without rupture, unspecified: Secondary | ICD-10-CM | POA: Insufficient documentation

## 2013-10-11 DIAGNOSIS — E119 Type 2 diabetes mellitus without complications: Secondary | ICD-10-CM | POA: Insufficient documentation

## 2013-10-11 DIAGNOSIS — I10 Essential (primary) hypertension: Secondary | ICD-10-CM | POA: Insufficient documentation

## 2013-10-11 DIAGNOSIS — E785 Hyperlipidemia, unspecified: Secondary | ICD-10-CM | POA: Insufficient documentation

## 2013-10-11 DIAGNOSIS — R0989 Other specified symptoms and signs involving the circulatory and respiratory systems: Secondary | ICD-10-CM

## 2013-10-11 NOTE — Telephone Encounter (Signed)
Follow Up  The telephone number is back in service now/// Requests a call back for the results.

## 2013-10-11 NOTE — Telephone Encounter (Signed)
Reviewed results of carotid and nuclear study with daughter.  Aware pt needs 2 D Echo and will be called to schedule appt.

## 2013-10-14 ENCOUNTER — Telehealth: Payer: Self-pay | Admitting: Family Medicine

## 2013-10-16 ENCOUNTER — Telehealth: Payer: Self-pay | Admitting: Surgery

## 2013-10-16 ENCOUNTER — Ambulatory Visit (HOSPITAL_COMMUNITY)
Admission: RE | Admit: 2013-10-16 | Discharge: 2013-10-16 | Disposition: A | Discharge status: Home / Self Care | Payer: Medicare Other | Source: Ambulatory Visit | Attending: Cardiovascular Disease | Admitting: Cardiovascular Disease

## 2013-10-16 ENCOUNTER — Encounter (HOSPITAL_COMMUNITY)
Admission: RE | Disposition: A | Discharge status: Home / Self Care | Payer: Self-pay | Source: Ambulatory Visit | Attending: Cardiovascular Disease

## 2013-10-16 DIAGNOSIS — I714 Abdominal aortic aneurysm, without rupture, unspecified: Secondary | ICD-10-CM | POA: Insufficient documentation

## 2013-10-16 DIAGNOSIS — J449 Chronic obstructive pulmonary disease, unspecified: Secondary | ICD-10-CM | POA: Insufficient documentation

## 2013-10-16 DIAGNOSIS — I10 Essential (primary) hypertension: Secondary | ICD-10-CM | POA: Insufficient documentation

## 2013-10-16 DIAGNOSIS — Z7902 Long term (current) use of antithrombotics/antiplatelets: Secondary | ICD-10-CM | POA: Insufficient documentation

## 2013-10-16 DIAGNOSIS — I2510 Atherosclerotic heart disease of native coronary artery without angina pectoris: Secondary | ICD-10-CM | POA: Insufficient documentation

## 2013-10-16 DIAGNOSIS — I739 Peripheral vascular disease, unspecified: Secondary | ICD-10-CM

## 2013-10-16 DIAGNOSIS — I70219 Atherosclerosis of native arteries of extremities with intermittent claudication, unspecified extremity: Secondary | ICD-10-CM

## 2013-10-16 DIAGNOSIS — F17200 Nicotine dependence, unspecified, uncomplicated: Secondary | ICD-10-CM | POA: Insufficient documentation

## 2013-10-16 DIAGNOSIS — J4489 Other specified chronic obstructive pulmonary disease: Secondary | ICD-10-CM | POA: Insufficient documentation

## 2013-10-16 DIAGNOSIS — E119 Type 2 diabetes mellitus without complications: Secondary | ICD-10-CM | POA: Insufficient documentation

## 2013-10-16 HISTORY — PX: ABDOMINAL AORTAGRAM: SHX5454

## 2013-10-16 LAB — CBC
HCT: 36.7 % — ABNORMAL LOW (ref 39.0–52.0)
Hemoglobin: 13.4 g/dL (ref 13.0–17.0)
MCH: 29.9 pg (ref 26.0–34.0)
MCHC: 36.5 g/dL — ABNORMAL HIGH (ref 30.0–36.0)
MCHC: 37.4 g/dL — ABNORMAL HIGH (ref 30.0–36.0)
Platelets: 231 10*3/uL (ref 150–400)
Platelets: 281 10*3/uL (ref 150–400)
RDW: 14.5 % (ref 11.5–15.5)
RDW: 14.6 % (ref 11.5–15.5)
WBC: 7.4 10*3/uL (ref 4.0–10.5)

## 2013-10-16 SURGERY — ABDOMINAL AORTAGRAM
Anesthesia: LOCAL

## 2013-10-16 MED ORDER — SODIUM CHLORIDE 0.9 % IJ SOLN
3.0000 mL | Freq: Two times a day (BID) | INTRAMUSCULAR | Status: DC
Start: 2013-10-16 — End: 2013-10-16

## 2013-10-16 MED ORDER — FENTANYL CITRATE 0.05 MG/ML IJ SOLN
INTRAMUSCULAR | Status: AC
  Filled 2013-10-16: qty 2

## 2013-10-16 MED ORDER — ASPIRIN 81 MG PO CHEW
CHEWABLE_TABLET | ORAL | Status: AC
  Filled 2013-10-16: qty 1

## 2013-10-16 MED ORDER — SODIUM CHLORIDE 0.9 % IV SOLN: INTRAVENOUS | Status: DC

## 2013-10-16 MED ORDER — SODIUM CHLORIDE 0.9 % IV SOLN
INTRAVENOUS | Status: DC
Start: 2013-10-17 — End: 2013-10-16
  Administered 2013-10-16: 100 mL/h via INTRAVENOUS

## 2013-10-16 MED ORDER — ASPIRIN 81 MG PO CHEW
81.0000 mg | CHEWABLE_TABLET | ORAL | Status: AC
  Administered 2013-10-16: 81 mg via ORAL

## 2013-10-16 MED ORDER — SODIUM CHLORIDE 0.9 % IJ SOLN: 3.0000 mL | INTRAMUSCULAR | Status: DC | PRN

## 2013-10-16 MED ORDER — LIDOCAINE HCL (PF) 1 % IJ SOLN
INTRAMUSCULAR | Status: AC
  Filled 2013-10-16: qty 30

## 2013-10-16 MED ORDER — HEPARIN (PORCINE) IN NACL 2-0.9 UNIT/ML-% IJ SOLN
INTRAMUSCULAR | Status: AC
  Filled 2013-10-16: qty 1000

## 2013-10-16 MED ORDER — MIDAZOLAM HCL 2 MG/2ML IJ SOLN
INTRAMUSCULAR | Status: AC
  Filled 2013-10-16: qty 2

## 2013-10-16 MED ORDER — SODIUM CHLORIDE 0.9 % IV SOLN
250.0000 mL | INTRAVENOUS | Status: DC | PRN
Start: 2013-10-16 — End: 2013-10-16

## 2013-10-16 MED ORDER — ACETAMINOPHEN 325 MG PO TABS: 650.0000 mg | ORAL_TABLET | ORAL | Status: DC | PRN

## 2013-10-16 NOTE — H&P (View-Only) (Signed)
Primary care physician: Dr. Moore Primary cardiologist: Dr. Hochrein  HPI This is a 72-year-old male who was referred by Dr. Hochrein for evaluation of peripheral arterial disease.  He has known history of coronary artery disease. Catheterization in 2007 demonstrated very heavily calcified vessels with nonobstructive disease in the major vessels. He did have a large to moderate size diagonal with ostial 80% stenosis it was managed medically. He had recent atypical chest pain. He underwent a pharmacologic nuclear stress test which showed no evidence of ischemia with slightly reduced LV systolic function. He reports no further episodes of chest discomfort. Biggest complaint at this time and bilateral calf discomfort which started years ago with suspicion for peripheral arterial disease. He has been on Pletal for many years. He has noticed significant worsening over the last 6-12 months. He now gets bilateral calf discomfort even after walking short distance in the hallway. The discomfort is usually severe and forces him to stop before he can walk again. He had few episodes of discomfort at rest especially at night. He denies significant thigh or buttock discomfort. He continues to smoke one pack per day. No lower extremity ulceration.  No Known Allergies  Current Outpatient Prescriptions  Medication Sig Dispense Refill  . atorvastatin (LIPITOR) 40 MG tablet Take 1 tablet (40 mg total) by mouth daily.  30 tablet  2  . cilostazol (PLETAL) 100 MG tablet Take 1 tablet (100 mg total) by mouth 2 (two) times daily.  60 tablet  2  . ciprofloxacin (CIPRO) 500 MG tablet Take 1 tablet (500 mg total) by mouth 2 (two) times daily.  20 tablet  0  . diclofenac (VOLTAREN) 75 MG EC tablet Take 1 tablet (75 mg total) by mouth 2 (two) times daily.  60 tablet  3  . lisinopril (PRINIVIL,ZESTRIL) 20 MG tablet Take 1 tablet (20 mg total) by mouth daily.  30 tablet  2  . metFORMIN (GLUCOPHAGE) 500 MG tablet Take 1 tablet  (500 mg total) by mouth 2 (two) times daily with a meal.  60 tablet  2  . metroNIDAZOLE (FLAGYL) 500 MG tablet Take 1 tablet (500 mg total) by mouth 3 (three) times daily.  21 tablet  0  . omeprazole (PRILOSEC) 20 MG capsule Take 1 capsule (20 mg total) by mouth daily.  30 capsule  5  . ranitidine (ZANTAC) 150 MG tablet Take 1 tablet (150 mg total) by mouth 2 (two) times daily.  60 tablet  5   No current facility-administered medications for this visit.    Past Medical History  Diagnosis Date  . Diabetes mellitus without complication   . Hyperlipidemia   . GERD (gastroesophageal reflux disease)   . Hypertension   . COPD (chronic obstructive pulmonary disease)   . Insomnia   . Gout     Past Surgical History  Procedure Laterality Date  . Hemicolectomy      Family History  Problem Relation Age of Onset  . Heart disease Father 92    pacemaker  . Diabetes Mother     renal failure  . CAD Brother     died at 68    History   Social History  . Marital Status: Divorced    Spouse Name: N/A    Number of Children: 3  . Years of Education: N/A   Occupational History  . Not on file.   Social History Main Topics  . Smoking status: Current Every Day Smoker -- 2.00 packs/day for 55 years      Types: Cigarettes  . Smokeless tobacco: Not on file  . Alcohol Use: No  . Drug Use: No  . Sexual Activity: Not on file   Other Topics Concern  . Not on file   Social History Narrative   Lives alone.     ROS:  Cough, constipation.  Otherwise as stated in the HPI and negative for all other systems.    PHYSICAL EXAM BP 160/83  Pulse 101  Ht 5' 8" (1.727 m)  Wt 146 lb 6.4 oz (66.407 kg)  BMI 22.27 kg/m2 GENERAL:  Well appearing HEENT:  Pupils equal round and reactive, fundi not visualized, oral mucosa unremarkable NECK:  No jugular venous distention, waveform within normal limits, carotid upstroke brisk and symmetric, bilateral carotid bruits, no thyromegaly LYMPHATICS:  No  cervical, inguinal adenopathy LUNGS:  Clear to auscultation bilaterally BACK:  No CVA tenderness CHEST:  Unremarkable HEART:  PMI not displaced or sustained,S1 and S2 within normal limits, no S3, no S4, no clicks, no rubs, no murmurs ABD:  Flat, positive bowel sounds normal in frequency in pitch, positive midline and bilateral bruits, no rebound, no guarding, no midline pulsatile mass, no hepatomegaly, no splenomegaly EXT:  2 plus pulses upper, absent DP/PT bilateral, no edema, no cyanosis no clubbing SKIN:  No rashes no nodules NEURO:  Cranial nerves II through XII grossly intact, motor grossly intact throughout PSYCH:  Cognitively intact, oriented to person place and time Vascular: Femoral pulse: Mildly diminished on the right side with a bruit and absent on the left side. Distal pulses are not palpable. Both feet are extremely cold with dependent rubor.     ASSESSMENT AND PLAN     

## 2013-10-16 NOTE — Interval H&P Note (Signed)
History and Physical Interval Note:  10/16/2013 10:45 AM  Perry Kennedy  has presented today for surgery, with the diagnosis of pad  The various methods of treatment have been discussed with the patient and family. After consideration of risks, benefits and other options for treatment, the patient has consented to  Procedure(s): ABDOMINAL AORTAGRAM (N/A) as a surgical intervention .  The patient's history has been reviewed, patient examined, no change in status, stable for surgery.  I have reviewed the patient's chart and labs.  Questions were answered to the patient's satisfaction.     Lorine Bears

## 2013-10-16 NOTE — CV Procedure (Signed)
PERIPHERAL VASCULAR PROCEDURE  NAME:  Perry Kennedy   MRN: [id number] DOB:  [birth year]   ADMIT DATE: 10/16/2013  Performing Cardiologist: Lorine Bears Primary Physician: Rudi Heap, MD Primary Cardiologist:  Dr. Edgewood Lions  Procedures Performed:  Abdominal Aortic Angiogram with Bi-Iliofemoral Runoff  Bilateral Lower Extremity Angiography with run off    Indication(s):   Severe Claudication   Consent: The procedure with Risks/Benefits/Alternatives and Indications was reviewed with the patient .  All questions were answered.  Medications:  Sedation:  1 mg IV Versed, 50 mcg IV Fentanyl  Contrast:  114  Visipaque   Procedural details: The right groin was prepped, draped, and anesthetized with 1% lidocaine. Using modified Seldinger technique, a 4 [place] micropuncture sheath was introduced into the right common femoral artery. This was exchanged into a 5 [place] sheath. There was difficulty in crossing the stenosis in the right common iliac artery. A KMP  Was used to cross the stenosis. A 5 Fr Short Pigtail Catheter was advanced of over a  Versicore wire into the descending Aorta to a level just above the renal arteries. A power injection of 78ml/sec contrast over 1 sec was performed for Abdominal Aortic Angiography.  The catheter was then pulled back to a level just above the Aortic bifurcation, and a second power injection was performed to evaluate the iliac arteries.   Bilateral lower extremity arterial run off was then performed via power injection of 7 ml / sec contrast for a total of 77 ml.  The patient tolerated the procedure well with no immediate complications.    Hemodynamics:  Central Aortic Pressure / Mean Aortic Pressure: 148/76  Findings:  Abdominal aorta: Severe infrarenal aortic atherosclerosis with a small aortic aneurysm with a stenotic area post aneurysm.  Left renal artery: 30-40% ostial stenosis.  Right renal artery: There are 3 renal  arteries on the right side with mild ostial stenosis.  Celiac artery: Seems to be patent.  Superior mesenteric artery: Not well visualized but appears patent.  Right common iliac artery: Heavily calcified with 95% ostial stenosis.  Right internal iliac artery: Patent and calcified with diffuse disease.  Right external iliac artery: Diffuse 30-40% calcified disease.  Right common femoral artery: Heavily calcified with 70% stenosis.  Right profunda femoral artery: Mild proximal disease.  Right superficial femoral artery: Heavily calcified throughout its course with diffuse 50% disease proximally. There is diffuse 80% disease in the midsegment. The vessel is occluded distally with reconstitution in the popliteal artery via collaterals.  Right popliteal artery: Occluded proximally with minor irregularities in the midsegment  Right tibial peroneal trunk: Minor irregularities with and underfilling. There seems to be two-vessel runoff below the knee with an occluded anterior tibial.  Left common iliac artery:  Occluded at the ostium with reconstitution distally just proximal to the internal iliac artery.  Left internal iliac artery: Patent with diffuse disease and overall a small.  Left external iliac artery: 50% proximal stenosis.  Left common femoral artery: 40% disease.  Left profunda femoral artery: Normal  Left superficial femoral artery:  Heavily calcified and occluded proximally with reconstitution distally.  Left popliteal artery: 70% mid stenosis.  Left tibial peroneal trunk: The vessels below the knee are not well visualized but there seems to be two-vessel runoff.   Conclusions: Small infrarenal aortic aneurysm. 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 There is also significant right common femoral artery disease. Both SFAs are heavily calcified with occlusions.  Recommendations:  Vascular surgery consultation to consider  aortobifemoral bypass with right common femoral endarterectomy versus stent graft placement to the aorta with stenting of the right common iliac artery and right to left fem-fem bypass . I discussed the case with Dr. Myra Gianotti who will follow the patient has an outpatient.   Lorine Bears, MD, [facility] 10/16/2013 11:42 AM

## 2013-10-16 NOTE — Research (Signed)
[facility] Informed Consent   Subject Name: Perry Kennedy  Subject met inclusion and exclusion criteria.  The informed consent form, study requirements and expectations were reviewed with the subject and questions and concerns were addressed prior to the signing of the consent form.  The subject verbalized understanding of the trail requirements.  The subject agreed to participate in the [facility] trial and signed the informed consent.  The informed consent was obtained prior to performance of any protocol-specific procedures for the subject.  A copy of the signed informed consent was given to the subject and a copy was placed in the subject's medical record.  Jena Gauss Pruitt/Kenith Trickel 10/16/2013, 09:05 am

## 2013-10-16 NOTE — Telephone Encounter (Signed)
[facility] - Vascular surgery consultation to consider aortobifemoral bypass with right common femoral endarterectomy versus stent graft placement to the aorta with stenting of the right common iliac artery and right to left fem-fem bypass . I discussed the case with Dr. Myra Gianotti who will follow the patient has an outpatient. nofitifed patient and mailed np info.

## 2013-10-21 ENCOUNTER — Other Ambulatory Visit: Payer: Self-pay | Admitting: Family Medicine

## 2013-10-21 DIAGNOSIS — R109 Unspecified abdominal pain: Secondary | ICD-10-CM

## 2013-10-21 MED ORDER — METRONIDAZOLE 500 MG PO TABS: 500.0000 mg | ORAL_TABLET | Freq: Three times a day (TID) | ORAL | Status: DC

## 2013-10-21 MED ORDER — CIPROFLOXACIN HCL 500 MG PO TABS: 500.0000 mg | ORAL_TABLET | Freq: Two times a day (BID) | ORAL | Status: DC

## 2013-10-23 ENCOUNTER — Ambulatory Visit (INDEPENDENT_AMBULATORY_CARE_PROVIDER_SITE_OTHER): Payer: Medicare Other | Admitting: Cardiology

## 2013-10-23 ENCOUNTER — Telehealth: Payer: Self-pay

## 2013-10-23 ENCOUNTER — Encounter: Payer: Self-pay | Admitting: *Deleted

## 2013-10-23 ENCOUNTER — Encounter: Payer: Self-pay | Admitting: Cardiology

## 2013-10-23 ENCOUNTER — Other Ambulatory Visit (INDEPENDENT_AMBULATORY_CARE_PROVIDER_SITE_OTHER): Payer: Medicare Other

## 2013-10-23 ENCOUNTER — Encounter (HOSPITAL_COMMUNITY): Payer: Self-pay | Admitting: Pharmacy Technician

## 2013-10-23 VITALS — BP 155/82 | HR 113 | Ht 68.0 in | Wt 146.0 lb

## 2013-10-23 DIAGNOSIS — Z01810 Encounter for preprocedural cardiovascular examination: Secondary | ICD-10-CM

## 2013-10-23 DIAGNOSIS — I2510 Atherosclerotic heart disease of native coronary artery without angina pectoris: Secondary | ICD-10-CM

## 2013-10-23 NOTE — Progress Notes (Signed)
PATIENT CAME IN FOR LABS FROM DR.HOCHREIN

## 2013-10-23 NOTE — Patient Instructions (Signed)
The current medical regimen is effective;  continue present plan and medications.  Please go to [facility] today and have blood work  (CBC, BMP and PT/INR)  Your physician has requested that you have a cardiac catheterization. Cardiac catheterization is used to diagnose and/or treat various heart conditions. Doctors may recommend this procedure for a number of different reasons. The most common reason is to evaluate chest pain. Chest pain can be a symptom of coronary artery disease (CAD), and cardiac catheterization can show whether plaque is narrowing or blocking your heart's arteries. This procedure is also used to evaluate the valves, as well as measure the blood flow and oxygen levels in different parts of your heart. For further information please visit [email or web address] Please follow instruction sheet, as given.  Follow up with Dr Antoine Poche will be scheduled after your cath.

## 2013-10-23 NOTE — Progress Notes (Signed)
 HPI The patient presents for evaluation of chest pain. He has a history of coronary artery disease. Catheterization in 2007 demonstrated very heavily calcified vessels with nonobstructive disease in the major vessels. He did have a large to moderate size diagonal with ostial 80% stenosis it was managed medically. The patient has not participated in risk reduction. He did have a stress perfusion study in 2010 that demonstrated no high-risk findings. He has continued to smoke cigarettes.    I saw him that he was getting some chest discomfort. However, in October he had a stress perfusion study that was considered moderate risk without evidence of ischemia. However, the ejection fraction had on from 60- to 48%. I initially manage this medically and was going to get an echocardiogram. I also sent him for a PV evaluation.  Dr Arida did a angiogram and  An occluded left iliac with severe right iliac disease. He is being considered for aortobifem bypass.   He's not currently describing any new chest discomfort. He does have some dyspnea with exertion. His biggest complaint has been fatigued with any activity any particular leg fatigue and heaviness. He's not having any new palpitations, presyncope or syncope. He's not had any PND or orthopnea. He has had no weight gain or edema.  No Known Allergies  Current Outpatient Prescriptions  Medication Sig Dispense Refill  . aspirin 325 MG tablet Take 325 mg by mouth daily.      . atorvastatin (LIPITOR) 40 MG tablet Take 1 tablet (40 mg total) by mouth daily.  30 tablet  2  . cilostazol (PLETAL) 100 MG tablet Take 1 tablet (100 mg total) by mouth 2 (two) times daily.  60 tablet  2  . diclofenac (VOLTAREN) 75 MG EC tablet Take 1 tablet (75 mg total) by mouth 2 (two) times daily.  60 tablet  3  . lisinopril (PRINIVIL,ZESTRIL) 20 MG tablet Take 1 tablet (20 mg total) by mouth daily.  30 tablet  2  . metroNIDAZOLE (FLAGYL) 500 MG tablet Take 1 tablet (500 mg total) by  mouth 3 (three) times daily.  21 tablet  0  . omeprazole (PRILOSEC) 20 MG capsule Take 1 capsule (20 mg total) by mouth daily.  30 capsule  5  . ranitidine (ZANTAC) 150 MG tablet Take 1 tablet (150 mg total) by mouth 2 (two) times daily.  60 tablet  5   No current facility-administered medications for this visit.    Past Medical History  Diagnosis Date  . Diabetes mellitus without complication   . Hyperlipidemia   . GERD (gastroesophageal reflux disease)   . Hypertension   . COPD (chronic obstructive pulmonary disease)   . Insomnia   . Gout     Past Surgical History  Procedure Laterality Date  . Hemicolectomy      Family History  Problem Relation Age of Onset  . Heart disease Father 92    pacemaker  . Diabetes Mother     renal failure  . CAD Brother     died at 68    History   Social History  . Marital Status: Divorced    Spouse Name: N/A    Number of Children: 3  . Years of Education: N/A   Occupational History  . Not on file.   Social History Main Topics  . Smoking status: Current Every Day Smoker -- 2.00 packs/day for 55 years    Types: Cigarettes  . Smokeless tobacco: Not on file  . Alcohol Use: No  .   Drug Use: No  . Sexual Activity: Not on file   Other Topics Concern  . Not on file   Social History Narrative   Lives alone.     ROS:  Cough, constipation.  Otherwise as stated in the HPI and negative for all other systems.    PHYSICAL EXAM BP 155/82  Pulse 113  Ht 5' 8" (1.727 m)  Wt 146 lb (66.225 kg)  BMI 22.20 kg/m2 GENERAL:  Well appearing HEENT:  Pupils equal round and reactive, fundi not visualized, oral mucosa unremarkable NECK:  No jugular venous distention, waveform within normal limits, carotid upstroke brisk and symmetric, bilateral carotid bruits, no thyromegaly LYMPHATICS:  No cervical, inguinal adenopathy LUNGS:  Clear to auscultation bilaterally BACK:  No CVA tenderness CHEST:  Unremarkable HEART:  PMI not displaced or  sustained,S1 and S2 within normal limits, no S3, no S4, no clicks, no rubs, no murmurs ABD:  Flat, positive bowel sounds normal in frequency in pitch, positive midline and bilateral bruits, no rebound, no guarding, no midline pulsatile mass, no hepatomegaly, no splenomegaly EXT:  2 plus pulses upper, absent DP/PT bilateral, no edema, no cyanosis no clubbing, right femoral access sitewithout erythema or bruising SKIN:  No rashes no nodules NEURO:  Cranial nerves II through XII grossly intact, motor grossly intact throughout PSYCH:  Cognitively intact, oriented to person place and time   ASSESSMENT AND PLAN  CAD:  Given the  reduced ejection fraction  With a moderate risk perfusion study , decreased functional capacity and the need for vascular surgery cardiac catheterization is indicated.  The patient understands that risks included but are not limited to stroke (1 in 1000), death (1 in 1000), kidney failure [usually temporary] (1 in 500), bleeding (1 in 200), allergic reaction [possibly serious] (1 in 200).  The patient understands and agrees to proceed.   PVD:   Further plans for revascularization will be based on the results of the catheterization.  BRUITS:   He had moderate disease on carotid Dopplers last month. We will follow this in one year.  TOBACCO:  He has no desire at this point to quit smoking and we have talked about this.  HTN:  This is an unusually high readings. For now he will continue therapies as listed although I see this is continuing to be elevated he might need adjustment of his medications. Also check in the future renal artery Dopplers as he has bilateral bruits in that distribution.    

## 2013-10-23 NOTE — Telephone Encounter (Signed)
Pt given results of AAA

## 2013-10-24 LAB — CBC WITH DIFFERENTIAL
Basophils Absolute: 0 10*3/uL (ref 0.0–0.2)
Hemoglobin: 14.6 g/dL (ref 12.6–17.7)
Immature Granulocytes: 0 %
Lymphocytes Absolute: 1.7 10*3/uL (ref 0.7–3.1)
Lymphs: 17 %
MCV: 83 fL (ref 79–97)
Monocytes: 5 %
Platelets: 340 10*3/uL (ref 150–379)
RDW: 15.1 % (ref 12.3–15.4)
WBC: 10 10*3/uL (ref 3.4–10.8)

## 2013-10-24 LAB — BMP8+EGFR
BUN/Creatinine Ratio: 13 (ref 10–22)
BUN: 11 mg/dL (ref 8–27)
Calcium: 9.9 mg/dL (ref 8.6–10.2)
Creatinine, Ser: 0.88 mg/dL (ref 0.76–1.27)
GFR calc non Af Amer: 86 mL/min/{1.73_m2} (ref >59)
Potassium: 5.3 mmol/L — ABNORMAL HIGH (ref 3.5–5.2)
Sodium: 135 mmol/L (ref 134–144)

## 2013-10-24 LAB — PROTIME-INR: Prothrombin Time: 11.1 s (ref 9.1–12.0)

## 2013-10-28 ENCOUNTER — Other Ambulatory Visit: Payer: Self-pay | Admitting: Family Medicine

## 2013-10-29 ENCOUNTER — Encounter (HOSPITAL_COMMUNITY)
Admission: RE | Disposition: A | Discharge status: Home / Self Care | Payer: Self-pay | Source: Ambulatory Visit | Attending: Cardiology

## 2013-10-29 ENCOUNTER — Ambulatory Visit (HOSPITAL_COMMUNITY)
Admission: RE | Admit: 2013-10-29 | Discharge: 2013-10-29 | Disposition: A | Discharge status: Home / Self Care | Payer: Medicare Other | Source: Ambulatory Visit | Attending: Cardiology | Admitting: Cardiology

## 2013-10-29 DIAGNOSIS — J4489 Other specified chronic obstructive pulmonary disease: Secondary | ICD-10-CM | POA: Insufficient documentation

## 2013-10-29 DIAGNOSIS — G4700 Insomnia, unspecified: Secondary | ICD-10-CM | POA: Insufficient documentation

## 2013-10-29 DIAGNOSIS — E119 Type 2 diabetes mellitus without complications: Secondary | ICD-10-CM | POA: Insufficient documentation

## 2013-10-29 DIAGNOSIS — I10 Essential (primary) hypertension: Secondary | ICD-10-CM | POA: Insufficient documentation

## 2013-10-29 DIAGNOSIS — I2510 Atherosclerotic heart disease of native coronary artery without angina pectoris: Secondary | ICD-10-CM

## 2013-10-29 DIAGNOSIS — R0989 Other specified symptoms and signs involving the circulatory and respiratory systems: Secondary | ICD-10-CM | POA: Insufficient documentation

## 2013-10-29 DIAGNOSIS — E785 Hyperlipidemia, unspecified: Secondary | ICD-10-CM | POA: Insufficient documentation

## 2013-10-29 DIAGNOSIS — Z79899 Other long term (current) drug therapy: Secondary | ICD-10-CM | POA: Insufficient documentation

## 2013-10-29 DIAGNOSIS — I708 Atherosclerosis of other arteries: Secondary | ICD-10-CM | POA: Insufficient documentation

## 2013-10-29 DIAGNOSIS — I2584 Coronary atherosclerosis due to calcified coronary lesion: Secondary | ICD-10-CM | POA: Insufficient documentation

## 2013-10-29 DIAGNOSIS — R0609 Other forms of dyspnea: Secondary | ICD-10-CM | POA: Insufficient documentation

## 2013-10-29 DIAGNOSIS — F17200 Nicotine dependence, unspecified, uncomplicated: Secondary | ICD-10-CM | POA: Insufficient documentation

## 2013-10-29 DIAGNOSIS — M109 Gout, unspecified: Secondary | ICD-10-CM | POA: Insufficient documentation

## 2013-10-29 DIAGNOSIS — K219 Gastro-esophageal reflux disease without esophagitis: Secondary | ICD-10-CM | POA: Insufficient documentation

## 2013-10-29 DIAGNOSIS — Z7982 Long term (current) use of aspirin: Secondary | ICD-10-CM | POA: Insufficient documentation

## 2013-10-29 DIAGNOSIS — J449 Chronic obstructive pulmonary disease, unspecified: Secondary | ICD-10-CM | POA: Insufficient documentation

## 2013-10-29 HISTORY — PX: LEFT HEART CATHETERIZATION WITH CORONARY ANGIOGRAM: SHX5451

## 2013-10-29 LAB — GLUCOSE, CAPILLARY

## 2013-10-29 SURGERY — LEFT HEART CATHETERIZATION WITH CORONARY ANGIOGRAM
Anesthesia: LOCAL

## 2013-10-29 MED ORDER — SODIUM CHLORIDE 0.9 % IV SOLN: INTRAVENOUS | Status: DC

## 2013-10-29 MED ORDER — SODIUM CHLORIDE 0.9 % IJ SOLN: 3.0000 mL | INTRAMUSCULAR | Status: DC | PRN

## 2013-10-29 MED ORDER — MIDAZOLAM HCL 2 MG/2ML IJ SOLN
INTRAMUSCULAR | Status: AC
  Filled 2013-10-29: qty 2

## 2013-10-29 MED ORDER — SODIUM CHLORIDE 0.9 % IV SOLN
INTRAVENOUS | Status: DC
  Administered 2013-10-29: 08:00:00 via INTRAVENOUS

## 2013-10-29 MED ORDER — LIDOCAINE HCL (PF) 1 % IJ SOLN
INTRAMUSCULAR | Status: AC
  Filled 2013-10-29: qty 30

## 2013-10-29 MED ORDER — VERAPAMIL HCL 2.5 MG/ML IV SOLN
INTRAVENOUS | Status: AC
  Filled 2013-10-29: qty 2

## 2013-10-29 MED ORDER — ASPIRIN 81 MG PO CHEW
CHEWABLE_TABLET | ORAL | Status: AC
  Administered 2013-10-29: 81 mg via ORAL
  Filled 2013-10-29: qty 1

## 2013-10-29 MED ORDER — HEPARIN SODIUM (PORCINE) 1000 UNIT/ML IJ SOLN
INTRAMUSCULAR | Status: AC
  Filled 2013-10-29: qty 1

## 2013-10-29 MED ORDER — SODIUM CHLORIDE 0.9 % IV SOLN: 250.0000 mL | INTRAVENOUS | Status: DC | PRN

## 2013-10-29 MED ORDER — ONDANSETRON HCL 4 MG/2ML IJ SOLN: 4.0000 mg | Freq: Four times a day (QID) | INTRAMUSCULAR | Status: DC | PRN

## 2013-10-29 MED ORDER — HEPARIN (PORCINE) IN NACL 2-0.9 UNIT/ML-% IJ SOLN
INTRAMUSCULAR | Status: AC
  Filled 2013-10-29: qty 1000

## 2013-10-29 MED ORDER — SODIUM CHLORIDE 0.9 % IJ SOLN: 3.0000 mL | Freq: Two times a day (BID) | INTRAMUSCULAR | Status: DC

## 2013-10-29 MED ORDER — ACETAMINOPHEN 325 MG PO TABS: 650.0000 mg | ORAL_TABLET | ORAL | Status: DC | PRN

## 2013-10-29 MED ORDER — ASPIRIN 81 MG PO CHEW
81.0000 mg | CHEWABLE_TABLET | ORAL | Status: AC
  Administered 2013-10-29: 81 mg via ORAL

## 2013-10-29 NOTE — H&P (View-Only) (Signed)
HPI The patient presents for evaluation of chest pain. He has a history of coronary artery disease. Catheterization in 2007 demonstrated very heavily calcified vessels with nonobstructive disease in the major vessels. He did have a large to moderate size diagonal with ostial 80% stenosis it was managed medically. The patient has not participated in risk reduction. He did have a stress perfusion study in 2010 that demonstrated no high-risk findings. He has continued to smoke cigarettes.    I saw him that he was getting some chest discomfort. However, in October he had a stress perfusion study that was considered moderate risk without evidence of ischemia. However, the ejection fraction had on from 60- to 48%. I initially manage this medically and was going to get an echocardiogram. I also sent him for a PV evaluation.  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 He is being considered for aortobifem bypass.   He's not currently describing any new chest discomfort. He does have some dyspnea with exertion. His biggest complaint has been fatigued with any activity any particular leg fatigue and heaviness. He's not having any new palpitations, presyncope or syncope. He's not had any PND or orthopnea. He has had no weight gain or edema.  No Known Allergies  Current Outpatient Prescriptions  Medication Sig Dispense Refill  . aspirin 325 MG tablet Take 325 mg by mouth daily.      Marland Kitchen atorvastatin (LIPITOR) 40 MG tablet Take 1 tablet (40 mg total) by mouth daily.  30 tablet  2  . cilostazol (PLETAL) 100 MG tablet Take 1 tablet (100 mg total) by mouth 2 (two) times daily.  60 tablet  2  . diclofenac (VOLTAREN) 75 MG EC tablet Take 1 tablet (75 mg total) by mouth 2 (two) times daily.  60 tablet  3  . lisinopril (PRINIVIL,ZESTRIL) 20 MG tablet Take 1 tablet (20 mg total) by mouth daily.  30 tablet  2  . metroNIDAZOLE (FLAGYL) 500 MG tablet Take 1 tablet (500 mg total) by  mouth 3 (three) times daily.  21 tablet  0  . omeprazole (PRILOSEC) 20 MG capsule Take 1 capsule (20 mg total) by mouth daily.  30 capsule  5  . ranitidine (ZANTAC) 150 MG tablet Take 1 tablet (150 mg total) by mouth 2 (two) times daily.  60 tablet  5   No current facility-administered medications for this visit.    Past Medical History  Diagnosis Date  . Diabetes mellitus without complication   . Hyperlipidemia   . GERD (gastroesophageal reflux disease)   . Hypertension   . COPD (chronic obstructive pulmonary disease)   . Insomnia   . Gout     Past Surgical History  Procedure Laterality Date  . Hemicolectomy      Family History  Problem Relation Age of Onset  . Heart disease Father 6    pacemaker  . Diabetes Mother     renal failure  . CAD Brother     died at 28    History   Social History  . Marital Status: Divorced    Spouse Name: N/A    Number of Children: 3  . Years of Education: N/A   Occupational History  . Not on file.   Social History Main Topics  . Smoking status: Current Every Day Smoker -- 2.00 packs/day for 55 years    Types: Cigarettes  . Smokeless tobacco: Not on file  . Alcohol Use: No  .  Drug Use: No  . Sexual Activity: Not on file   Other Topics Concern  . Not on file   Social History Narrative   Lives alone.     ROS:  Cough, constipation.  Otherwise as stated in the HPI and negative for all other systems.    PHYSICAL EXAM BP 155/82  Pulse 113  Ht 5\' 8"  (1.727 m)  Wt 146 lb (66.225 kg)  BMI 22.20 kg/m2 GENERAL:  Well appearing HEENT:  Pupils equal round and reactive, fundi not visualized, oral mucosa unremarkable NECK:  No jugular venous distention, waveform within normal limits, carotid upstroke brisk and symmetric, bilateral carotid bruits, no thyromegaly LYMPHATICS:  No cervical, inguinal adenopathy LUNGS:  Clear to auscultation bilaterally BACK:  No CVA tenderness CHEST:  Unremarkable HEART:  PMI not displaced or  sustained,S1 and S2 within normal limits, no S3, no S4, no clicks, no rubs, no murmurs ABD:  Flat, positive bowel sounds normal in frequency in pitch, positive midline and bilateral bruits, no rebound, no guarding, no midline pulsatile mass, no hepatomegaly, no splenomegaly EXT:  2 plus pulses upper, absent DP/PT bilateral, no edema, no cyanosis no clubbing, right femoral access sitewithout erythema or bruising SKIN:  No rashes no nodules NEURO:  Cranial nerves II through XII grossly intact, motor grossly intact throughout PSYCH:  Cognitively intact, oriented to person place and time   ASSESSMENT AND PLAN  CAD:  Given the  reduced ejection fraction  With a moderate risk perfusion study , decreased functional capacity and the need for vascular surgery cardiac catheterization is indicated.  The patient understands that risks included but are not limited to stroke (1 in 1000), death (1 in 1000), kidney failure [usually temporary] (1 in 500), bleeding (1 in 200), allergic reaction [possibly serious] (1 in 200).  The patient understands and agrees to proceed.   PVD:   Further plans for revascularization will be based on the results of the catheterization.  BRUITS:   He had moderate disease on carotid Dopplers last month. We will follow this in one year.  TOBACCO:  He has no desire at this point to quit smoking and we have talked about this.  HTN:  This is an unusually high readings. For now he will continue therapies as listed although I see this is continuing to be elevated he might need adjustment of his medications. Also check in the future renal artery Dopplers as he has bilateral bruits in that distribution.

## 2013-10-29 NOTE — Interval H&P Note (Signed)
Cath Lab Visit (complete for each Cath Lab visit)  Clinical Evaluation Leading to the Procedure:   ACS: no  Non-ACS:    Anginal Classification: CCS III  Anti-ischemic medical therapy: Maximal Therapy (2 or more classes of medications)  Non-Invasive Test Results: Intermediate-risk stress test findings: cardiac mortality 1-3%/year  Prior CABG: No previous CABG      History and Physical Interval Note:  10/29/2013 9:56 AM  Perry Kennedy  has presented today for surgery, with the diagnosis of CAD  The various methods of treatment have been discussed with the patient and family. After consideration of risks, benefits and other options for treatment, the patient has consented to  Procedure(s): LEFT HEART CATHETERIZATION WITH CORONARY ANGIOGRAM (N/A) as a surgical intervention .  The patient's history has been reviewed, patient examined, no change in status, stable for surgery.  I have reviewed the patient's chart and labs.  Questions were answered to the patient's satisfaction.     Rollene Rotunda

## 2013-10-29 NOTE — Progress Notes (Signed)
Discharge instruction given per MD order.  Pt and CG able to verbalize understanding.  Pt to car via wheelchair. 

## 2013-10-29 NOTE — Progress Notes (Signed)
Pt received form cath procedure alert and denies andy discomfort at this time.

## 2013-10-29 NOTE — CV Procedure (Signed)
   Cardiac Catheterization Procedure Note  Name: Perry Kennedy MRN: [id number] DOB: 03-16-1941  Procedure: Left Heart Cath, Selective Coronary Angiography, LV angiography  Indication:  Abnormal stress perfusion study.  Procedural details: The right radial was prepped, draped, and anesthetized with 1% lidocaine. Using modified Seldinger technique, a 5 French sheath was introduced into the right radial artery. Standard Judkins catheters were used for coronary angiography and left ventriculography. Catheter exchanges were performed over a guidewire. There were no immediate procedural complications. The patient was transferred to the post catheterization recovery area for further monitoring.  Procedural Findings:  Hemodynamics:     AO 130/63    LV 127/0   Coronary angiography:  Coronary dominance: Right  Left mainstem:   Heavy calcification.  Long 50%  Left anterior descending (LAD):   Proximal long 40% with heavy calcification.  Prox 50% before D1.  Mid to distal 40%.  D1 is moderate sized with ostial 70%.  Left circumflex (LCx):  AV groove normal.  RI large and branching with superior branch ostial 60%.   Right coronary artery (RCA):  Large.  Proximal 25%, long mid 25%, focal 25% before the PDA.  Small PL normal.      Left ventriculography: Left ventricular systolic function is normal, LVEF is estimated at 55%, there is no significant mitral regurgitation   Final Conclusions:  Heavily calcified vessels with moderate CAD.  Normal EF.   Marland Kitchen    Recommendations: Given the lack of ischemia in the distribution of the diagonal I would continue to manage this medically. This would be a difficult vessel for intervention.  He needs aggressive risk reduction  Rollene Rotunda 10/29/2013, 10:32 AM

## 2013-11-13 ENCOUNTER — Encounter: Payer: Self-pay | Admitting: Surgery

## 2013-11-18 ENCOUNTER — Encounter: Payer: Self-pay | Admitting: Surgery

## 2013-11-18 ENCOUNTER — Other Ambulatory Visit: Payer: Self-pay

## 2013-11-18 ENCOUNTER — Ambulatory Visit (INDEPENDENT_AMBULATORY_CARE_PROVIDER_SITE_OTHER): Payer: Medicare Other | Admitting: Surgery

## 2013-11-18 VITALS — BP 132/89 | HR 107 | Ht 68.0 in | Wt 141.9 lb

## 2013-11-18 DIAGNOSIS — I739 Peripheral vascular disease, unspecified: Secondary | ICD-10-CM

## 2013-11-18 NOTE — Progress Notes (Signed)
   Patient name: Owens L Ruffolo MRN: [id number] DOB: 05/12/1941 Sex: male   Referred by: Dr. Hochrein  Reason for referral:  Chief Complaint  Patient presents with  . New Evaluation    consult for surgery    HISTORY OF PRESENT ILLNESS: This is a very pleasant 72-year-old gentleman who is here today for violation of lower extremity peripheral vascular disease.  The patient recently underwent angiography which revealed an occluded left iliac system and a highly diseased right iliac system.  He also has an occluded left superficial femoral artery.  He has significant limitations with his activity.  He also complains of pain in his left hip.  He states that after he she also has significant pain in his abdomen.  He will also abdominal pain when he does not deep.  The patient has significant coronary disease.  He recently underwent cardiac catheterization and was cleared for surgery. The patient suffers from hypercholesterolemia and is managed with a stent.  He is on an ACE inhibitor for his hypertension.  He has COPD and continues to smoke was no desire to quit.   Past Medical History  Diagnosis Date  . Diabetes mellitus without complication   . Hyperlipidemia   . GERD (gastroesophageal reflux disease)   . Hypertension   . COPD (chronic obstructive pulmonary disease)   . Insomnia   . Gout   . DVT (deep venous thrombosis)   . Peripheral vascular disease     Past Surgical History  Procedure Laterality Date  . Hemicolectomy      History   Social History  . Marital Status: Divorced    Spouse Name: N/A    Number of Children: 3  . Years of Education: N/A   Occupational History  . Not on file.   Social History Main Topics  . Smoking status: Current Every Day Smoker -- 2.00 packs/day for 55 years    Types: Cigarettes  . Smokeless tobacco: Not on file  . Alcohol Use: No  . Drug Use: No  . Sexual Activity: Not on file   Other Topics Concern  . Not on file   Social  History Narrative   Lives alone.     Family History  Problem Relation Age of Onset  . Heart disease Father 92    pacemaker  . Diabetes Mother     renal failure  . Heart disease Mother   . CAD Brother     died at 68  . COPD Brother   . Heart disease Brother   . Hyperlipidemia Daughter   . Diabetes Son   . Heart disease Son     before age 60  . Heart attack Son     Allergies as of 11/18/2013 - Review Complete 11/18/2013  Allergen Reaction Noted  . Morphine and related Other (See Comments) 10/23/2013    Current Outpatient Prescriptions on File Prior to Visit  Medication Sig Dispense Refill  . aspirin 325 MG tablet Take 325 mg by mouth daily.      . atorvastatin (LIPITOR) 40 MG tablet Take 1 tablet (40 mg total) by mouth daily.  30 tablet  2  . cilostazol (PLETAL) 100 MG tablet Take 1 tablet (100 mg total) by mouth 2 (two) times daily.  60 tablet  2  . ciprofloxacin (CIPRO) 500 MG tablet TAKE ONE TABLET BY MOUTH TWICE DAILY  20 tablet  0  . diclofenac (VOLTAREN) 75 MG EC tablet Take 1 tablet (75 mg total) by mouth   2 (two) times daily.  60 tablet  3  . lisinopril (PRINIVIL,ZESTRIL) 20 MG tablet Take 1 tablet (20 mg total) by mouth daily.  30 tablet  2  . metroNIDAZOLE (FLAGYL) 500 MG tablet TAKE ONE TABLET BY MOUTH THREE TIMES DAILY  21 tablet  0  . omeprazole (PRILOSEC) 20 MG capsule Take 1 capsule (20 mg total) by mouth daily.  30 capsule  5  . ranitidine (ZANTAC) 150 MG tablet Take 1 tablet (150 mg total) by mouth 2 (two) times daily.  60 tablet  5   No current facility-administered medications on file prior to visit.     REVIEW OF SYSTEMS: Cardiovascular: Positive for chest pain, pain in legs were groin flap and leg swelling Pulmonary: No productive cough, asthma or wheezing. Neurologic: Positive for weakness and numbness in his leg Hematologic: No bleeding problems or clotting disorders. Musculoskeletal: No joint pain or joint swelling. Gastrointestinal: No blood in  stool or hematemesis Genitourinary: No dysuria or hematuria. Psychiatric:: No history of major depression. Integumentary: No rashes or ulcers. Constitutional: No fever or chills.  PHYSICAL EXAMINATION: General: The patient appears their stated age.  Vital signs are BP 132/89  Pulse 107  Ht 5' 8" (1.727 m)  Wt 141 lb 14.4 oz (64.365 kg)  BMI 21.58 kg/m2  SpO2 98% HEENT:  No gross abnormalities Pulmonary: Respirations are non-labored Abdomen: Soft and non-tender  Musculoskeletal: There are no major deformities.   Neurologic: No focal weakness or paresthesias are detected, Skin: There are no ulcer or rashes noted. Psychiatric: The patient has normal affect. Cardiovascular: There is a regular rate and rhythm without significant murmur appreciated.  No carotid bruit  Diagnostic Studies: I have reviewed his angiogram which shows severe aortoiliac occlusive disease.  I have also reviewed his CT scan from one month ago which shows significant calcification within his superior mesenteric and celiac arteries    Assessment:  Severe peripheral vascular disease with aortoiliac Plan: I discussed our options for surgical revascularization.  These include extra-anatomic vs. an aortobifemoral bypass graft.  After full discussion I feel the best option is to proceed with an aortobifemoral bypass graft.  I discussed the risks and benefits with the patient which include but are not limited to pulmonary complications, especially pneumonia given his smoking history, and death, bleeding, the need for additional operations.  I am somewhat concerned that he may also have mesenteric angina which is causing his abdominal pain with eating.  Therefore I will plan on shooting an abdominal aortogram while he is on the table and then considering whether or not to proceed with mesenteric revascularization during the time of his aortobifemoral bypass graft.  He was to this done as soon as possible.  His operation is  been scheduled for this Friday, January 2.  He is going to stop his cilostazol today.     V. Wells Brabham IV, M.D. [facility] Office: [phone] Pager:  [phone]   

## 2013-11-19 ENCOUNTER — Encounter (HOSPITAL_COMMUNITY): Payer: Self-pay

## 2013-11-19 ENCOUNTER — Encounter (HOSPITAL_COMMUNITY)
Admission: RE | Admit: 2013-11-19 | Discharge: 2013-11-19 | Disposition: A | Discharge status: Home / Self Care | Payer: Medicare Other | Source: Ambulatory Visit | Attending: Surgery | Admitting: Surgery

## 2013-11-19 ENCOUNTER — Ambulatory Visit (HOSPITAL_COMMUNITY)
Admission: RE | Admit: 2013-11-19 | Discharge: 2013-11-19 | Disposition: A | Discharge status: Home / Self Care | Payer: Medicare Other | Source: Ambulatory Visit | Attending: Anesthesiology | Admitting: Anesthesiology

## 2013-11-19 DIAGNOSIS — I739 Peripheral vascular disease, unspecified: Secondary | ICD-10-CM | POA: Insufficient documentation

## 2013-11-19 DIAGNOSIS — Z87891 Personal history of nicotine dependence: Secondary | ICD-10-CM | POA: Insufficient documentation

## 2013-11-19 DIAGNOSIS — Z01818 Encounter for other preprocedural examination: Secondary | ICD-10-CM | POA: Insufficient documentation

## 2013-11-19 DIAGNOSIS — I700 Atherosclerosis of aorta: Secondary | ICD-10-CM | POA: Insufficient documentation

## 2013-11-19 DIAGNOSIS — Z01812 Encounter for preprocedural laboratory examination: Secondary | ICD-10-CM | POA: Insufficient documentation

## 2013-11-19 HISTORY — DX: Headache: R51

## 2013-11-19 HISTORY — DX: Atherosclerotic heart disease of native coronary artery without angina pectoris: I25.10

## 2013-11-19 LAB — COMPREHENSIVE METABOLIC PANEL
AST: 31 U/L (ref 0–37)
Albumin: 3.9 g/dL (ref 3.5–5.2)
Calcium: 9.3 mg/dL (ref 8.4–10.5)
Chloride: 92 mEq/L — ABNORMAL LOW (ref 96–112)
Creatinine, Ser: 0.61 mg/dL (ref 0.50–1.35)
Potassium: 5 mEq/L (ref 3.7–5.3)
Total Bilirubin: 0.3 mg/dL (ref 0.3–1.2)
Total Protein: 7.4 g/dL (ref 6.0–8.3)

## 2013-11-19 LAB — CBC
MCH: 30.2 pg (ref 26.0–34.0)
MCV: 81.8 fL (ref 78.0–100.0)
Platelets: 271 10*3/uL (ref 150–400)
RDW: 14.3 % (ref 11.5–15.5)
WBC: 9.9 10*3/uL (ref 4.0–10.5)

## 2013-11-19 LAB — BLOOD GAS, ARTERIAL
Acid-base deficit: 1.9 mmol/L (ref 0.0–2.0)
Drawn by: 181601
FIO2: 0.21 %
O2 Saturation: 97.8 %
pCO2 arterial: 34.2 mmHg — ABNORMAL LOW (ref 35.0–45.0)
pH, Arterial: 7.422 (ref 7.350–7.450)

## 2013-11-19 LAB — URINALYSIS, ROUTINE W REFLEX MICROSCOPIC
Bilirubin Urine: NEGATIVE
Glucose, UA: 500 mg/dL — AB
Hgb urine dipstick: NEGATIVE
Protein, ur: 30 mg/dL — AB
Urobilinogen, UA: 0.2 mg/dL (ref 0.0–1.0)
pH: 6 (ref 5.0–8.0)

## 2013-11-19 LAB — PROTIME-INR: INR: 0.85 (ref 0.00–1.49)

## 2013-11-19 LAB — URINE MICROSCOPIC-ADD ON

## 2013-11-19 LAB — ABO/RH: ABO/RH(D): A POS

## 2013-11-19 LAB — APTT: aPTT: 32 seconds (ref 24–37)

## 2013-11-19 MED ORDER — SODIUM CHLORIDE 0.9 % IV SOLN: 250.0000 mL | INTRAVENOUS | Status: DC | PRN

## 2013-11-19 MED ORDER — ASPIRIN 81 MG PO CHEW: 81.0000 mg | CHEWABLE_TABLET | ORAL | Status: DC

## 2013-11-19 MED ORDER — SODIUM CHLORIDE 0.9 % IJ SOLN: 3.0000 mL | Freq: Two times a day (BID) | INTRAMUSCULAR | Status: DC

## 2013-11-19 MED ORDER — SODIUM CHLORIDE 0.9 % IJ SOLN: 3.0000 mL | INTRAMUSCULAR | Status: DC | PRN

## 2013-11-19 NOTE — Progress Notes (Signed)
11/19/13 1338  OBSTRUCTIVE SLEEP APNEA  Have you ever been diagnosed with sleep apnea through a sleep study? No  Do you snore loudly (loud enough to be heard through closed doors)?  0  Do you often feel tired, fatigued, or sleepy during the daytime? 1  Has anyone observed you stop breathing during your sleep? 0  Do you have, or are you being treated for high blood pressure? 1  BMI more than 35 kg/m2? 0  Age over 71 years old? 1  Neck circumference greater than 40 cm/18 inches? 0  Gender: 1  Obstructive Sleep Apnea Score 4  Score 4 or greater  Results sent to PCP

## 2013-11-19 NOTE — Pre-Procedure Instructions (Signed)
Perry Kennedy  11/19/2013   Your procedure is scheduled on: Friday, January 2nd.  Report to [facility] Suite, Main Entrance/ Entrance "A" at 8:30 AM.  Call this number if you have problems the morning of surgery: [phone]   Remember:   Do not eat food or drink liquids after midnight.   Take these medicines the morning of surgery with A SIP OF WATER: omeprazole (PRILOSEC) ,ranitidine (ZANTAC). Stop taking Voltaren (Diclofenac) today.   Do not wear lotions, powders, or perfumes. You may wear deodorant.   Men may shave face and neck.  Do not bring valuables to the hospital.  [facility] is not responsible for any belongings or valuables.             Contacts, dentures or bridgework may not be worn into surgery.  Leave suitcase in the car. After surgery it may be brought to your room.  For patients admitted to the hospital, discharge time is determined by your treatment team.                 Special Instructions: Shower using CHG 2 nights before surgery and the night before surgery.  If you shower the day of surgery use CHG.  Use special wash - you have one bottle of CHG for all showers.  You should use approximately 1/3 of the bottle for each shower.   Please read over the following fact sheets that you were given: Pain Booklet, Coughing and Deep Breathing, Blood Transfusion Information and Surgical Site Infection Prevention

## 2013-11-20 ENCOUNTER — Encounter (HOSPITAL_COMMUNITY): Payer: Self-pay

## 2013-11-20 ENCOUNTER — Other Ambulatory Visit: Payer: Self-pay | Admitting: *Deleted

## 2013-11-20 NOTE — Progress Notes (Signed)
Anesthesia Chart Review:  Patient is a 72 year old male scheduled for ABFG, possible SMA bypass on 11/22/13 by Dr. Myra Gianotti.  History includes smoking, PAD, CAD, HLD, COPD, HTN, gout, insomnia, DVT, DM2, headaches, hemi-colectomy for sessile polyps '10. OSA screening score was 4.  PCP is Dr. Rudi Heap.  Cardiologist is Dr. Antoine Poche.  EKG on 10/29/13 showed ST, anterior infarct (age undetermined).  Nuclear stress test on 10/04/13 showed: Overall Impression: No reversible ischemia. Diaphragmatic attenuation is present. Cannot exclude old inferior wall MI. LV Ejection Fraction: 48%. LV Wall Motion: Normal Wall Motion. Since 2010 EF has dropped from 67% to 48%. According to Dr. Jenene Slicker note, it was considered a moderate risk scan and because he was being evaluated for AFBG, a cardiac cath was recommended.  (See below.)  Cardiac cath on 10/29/13 showed: Coronary angiography:  Coronary dominance: Right  Left mainstem: Heavy calcification. Long 50%  Left anterior descending (LAD): Proximal long 40% with heavy calcification. Prox 50% before D1. Mid to distal 40%. D1 is moderate sized with ostial 70%.  Left circumflex (LCx): AV groove normal. RI large and branching with superior branch ostial 60%.  Right coronary artery (RCA): Large. Proximal 25%, long mid 25%, focal 25% before the PDA. Small PL normal.  Left ventriculography: Left ventricular systolic function is normal, LVEF is estimated at 55%, there is no significant mitral regurgitation  Final Conclusions: Heavily calcified vessels with moderate CAD. Normal EF. Marland Kitchen  Recommendations: Given the lack of ischemia in the distribution of the diagonal I would continue to manage this medically. This would be a difficult vessel for intervention. He needs aggressive risk reduction. (Dr. Antoine Poche)  Carotid duplex on 10/08/13 showed 40-59% bilateral ICA stenosis, antegrade vertebral flow.  CXR on 11/19/13 showed mild probably chronic interstitial lung  disease.  Preoperative labs noted. Na 128, K 5.0, Cr 0.61, glucose 177, AST 31, ALT 55, H/H 13.9/37.7. Will order an ISTAT on the day of surgery to revaluate Na, K. If results are stable and otherwise no acute changes then I would anticipate that he could proceed as planned.  Velna Ochs [facility] Short Stay Center/Anesthesiology Phone [phone] 11/20/2013 11:23 AM

## 2013-11-21 MED ORDER — DEXTROSE 5 % IV SOLN
1.5000 g | INTRAVENOUS | Status: DC
  Administered 2013-11-22 (×2): 1.5 g via INTRAVENOUS
  Filled 2013-11-21: qty 1.5

## 2013-11-22 ENCOUNTER — Inpatient Hospital Stay (HOSPITAL_COMMUNITY): Payer: Medicare Other | Admitting: Anesthesiology

## 2013-11-22 ENCOUNTER — Encounter (HOSPITAL_COMMUNITY): Payer: Self-pay | Admitting: Surgery

## 2013-11-22 ENCOUNTER — Encounter (HOSPITAL_COMMUNITY)
Admission: RE | Disposition: A | Discharge status: Home / Self Care | Payer: Self-pay | Source: Ambulatory Visit | Attending: Surgery

## 2013-11-22 ENCOUNTER — Inpatient Hospital Stay (HOSPITAL_COMMUNITY)
Admission: RE | Admit: 2013-11-22 | Discharge: 2013-12-04 | DRG: 237 | Disposition: A | Discharge status: Home Health | Payer: Medicare Other | Source: Ambulatory Visit | Attending: Surgery | Admitting: Surgery

## 2013-11-22 ENCOUNTER — Inpatient Hospital Stay (HOSPITAL_COMMUNITY): Payer: Medicare Other

## 2013-11-22 ENCOUNTER — Encounter (HOSPITAL_COMMUNITY)
Admission: RE | Disposition: A | Discharge status: Home / Self Care | Payer: Medicare Other | Source: Ambulatory Visit | Attending: Surgery

## 2013-11-22 ENCOUNTER — Encounter (HOSPITAL_COMMUNITY): Payer: Medicare Other | Admitting: Vascular Surgery

## 2013-11-22 DIAGNOSIS — F17200 Nicotine dependence, unspecified, uncomplicated: Secondary | ICD-10-CM

## 2013-11-22 DIAGNOSIS — K5900 Constipation, unspecified: Secondary | ICD-10-CM | POA: Diagnosis not present

## 2013-11-22 DIAGNOSIS — I739 Peripheral vascular disease, unspecified: Secondary | ICD-10-CM | POA: Diagnosis present

## 2013-11-22 DIAGNOSIS — I728 Aneurysm of other specified arteries: Secondary | ICD-10-CM

## 2013-11-22 DIAGNOSIS — Z86718 Personal history of other venous thrombosis and embolism: Secondary | ICD-10-CM

## 2013-11-22 DIAGNOSIS — Z8249 Family history of ischemic heart disease and other diseases of the circulatory system: Secondary | ICD-10-CM

## 2013-11-22 DIAGNOSIS — R34 Anuria and oliguria: Secondary | ICD-10-CM

## 2013-11-22 DIAGNOSIS — D62 Acute posthemorrhagic anemia: Secondary | ICD-10-CM | POA: Diagnosis not present

## 2013-11-22 DIAGNOSIS — I824Y9 Acute embolism and thrombosis of unspecified deep veins of unspecified proximal lower extremity: Secondary | ICD-10-CM | POA: Diagnosis present

## 2013-11-22 DIAGNOSIS — Q211 Atrial septal defect: Secondary | ICD-10-CM

## 2013-11-22 DIAGNOSIS — I6340 Cerebral infarction due to embolism of unspecified cerebral artery: Secondary | ICD-10-CM | POA: Diagnosis not present

## 2013-11-22 DIAGNOSIS — I70219 Atherosclerosis of native arteries of extremities with intermittent claudication, unspecified extremity: Secondary | ICD-10-CM

## 2013-11-22 DIAGNOSIS — E1159 Type 2 diabetes mellitus with other circulatory complications: Secondary | ICD-10-CM | POA: Diagnosis present

## 2013-11-22 DIAGNOSIS — I771 Stricture of artery: Secondary | ICD-10-CM

## 2013-11-22 DIAGNOSIS — I7409 Other arterial embolism and thrombosis of abdominal aorta: Principal | ICD-10-CM | POA: Diagnosis present

## 2013-11-22 DIAGNOSIS — E785 Hyperlipidemia, unspecified: Secondary | ICD-10-CM | POA: Diagnosis present

## 2013-11-22 DIAGNOSIS — E7800 Pure hypercholesterolemia, unspecified: Secondary | ICD-10-CM | POA: Diagnosis present

## 2013-11-22 DIAGNOSIS — E119 Type 2 diabetes mellitus without complications: Secondary | ICD-10-CM

## 2013-11-22 DIAGNOSIS — I10 Essential (primary) hypertension: Secondary | ICD-10-CM | POA: Diagnosis present

## 2013-11-22 DIAGNOSIS — I82890 Acute embolism and thrombosis of other specified veins: Secondary | ICD-10-CM | POA: Diagnosis present

## 2013-11-22 DIAGNOSIS — E46 Unspecified protein-calorie malnutrition: Secondary | ICD-10-CM | POA: Diagnosis present

## 2013-11-22 DIAGNOSIS — J4489 Other specified chronic obstructive pulmonary disease: Secondary | ICD-10-CM | POA: Diagnosis present

## 2013-11-22 DIAGNOSIS — E876 Hypokalemia: Secondary | ICD-10-CM | POA: Diagnosis not present

## 2013-11-22 DIAGNOSIS — J449 Chronic obstructive pulmonary disease, unspecified: Secondary | ICD-10-CM | POA: Diagnosis present

## 2013-11-22 DIAGNOSIS — Z7982 Long term (current) use of aspirin: Secondary | ICD-10-CM

## 2013-11-22 DIAGNOSIS — K551 Chronic vascular disorders of intestine: Secondary | ICD-10-CM

## 2013-11-22 DIAGNOSIS — F4323 Adjustment disorder with mixed anxiety and depressed mood: Secondary | ICD-10-CM

## 2013-11-22 DIAGNOSIS — E875 Hyperkalemia: Secondary | ICD-10-CM

## 2013-11-22 DIAGNOSIS — I700 Atherosclerosis of aorta: Secondary | ICD-10-CM

## 2013-11-22 DIAGNOSIS — K219 Gastro-esophageal reflux disease without esophagitis: Secondary | ICD-10-CM | POA: Diagnosis present

## 2013-11-22 DIAGNOSIS — E870 Hyperosmolality and hypernatremia: Secondary | ICD-10-CM | POA: Diagnosis present

## 2013-11-22 DIAGNOSIS — M109 Gout, unspecified: Secondary | ICD-10-CM | POA: Diagnosis present

## 2013-11-22 DIAGNOSIS — I253 Aneurysm of heart: Secondary | ICD-10-CM | POA: Diagnosis present

## 2013-11-22 DIAGNOSIS — E8770 Fluid overload, unspecified: Secondary | ICD-10-CM | POA: Diagnosis not present

## 2013-11-22 DIAGNOSIS — I639 Cerebral infarction, unspecified: Secondary | ICD-10-CM

## 2013-11-22 DIAGNOSIS — I2510 Atherosclerotic heart disease of native coronary artery without angina pectoris: Secondary | ICD-10-CM

## 2013-11-22 DIAGNOSIS — Q2111 Secundum atrial septal defect: Secondary | ICD-10-CM

## 2013-11-22 DIAGNOSIS — I742 Embolism and thrombosis of arteries of the upper extremities: Secondary | ICD-10-CM

## 2013-11-22 HISTORY — PX: VISCERAL ANGIOGRAM: SHX5515

## 2013-11-22 HISTORY — PX: AORTA - BILATERAL FEMORAL ARTERY BYPASS GRAFT: SHX1175

## 2013-11-22 LAB — POCT I-STAT 4, (NA,K, GLUC, HGB,HCT)
GLUCOSE: 159 mg/dL — AB (ref 70–99)
GLUCOSE: 218 mg/dL — AB (ref 70–99)
Glucose, Bld: 184 mg/dL — ABNORMAL HIGH (ref 70–99)
Glucose, Bld: 211 mg/dL — ABNORMAL HIGH (ref 70–99)
HCT: 31 % — ABNORMAL LOW (ref 39.0–52.0)
HCT: 28 % — ABNORMAL LOW (ref 39.0–52.0)
HEMATOCRIT: 41 % (ref 39.0–52.0)
HEMATOCRIT: 31 % — AB (ref 39.0–52.0)
HEMOGLOBIN: 10.5 g/dL — AB (ref 13.0–17.0)
HEMOGLOBIN: 9.5 g/dL — AB (ref 13.0–17.0)
HEMOGLOBIN: 10.5 g/dL — AB (ref 13.0–17.0)
Hemoglobin: 13.9 g/dL (ref 13.0–17.0)
POTASSIUM: 4.4 meq/L (ref 3.7–5.3)
POTASSIUM: 4.9 meq/L (ref 3.7–5.3)
Potassium: 4.6 mEq/L (ref 3.7–5.3)
Potassium: 4.6 mEq/L (ref 3.7–5.3)
SODIUM: 138 meq/L (ref 137–147)
Sodium: 135 mEq/L — ABNORMAL LOW (ref 137–147)
Sodium: 135 mEq/L — ABNORMAL LOW (ref 137–147)
Sodium: 135 mEq/L — ABNORMAL LOW (ref 137–147)

## 2013-11-22 LAB — POCT I-STAT 7, (LYTES, BLD GAS, ICA,H+H)
Acid-base deficit: 2 mmol/L (ref 0.0–2.0)
BICARBONATE: 23.8 meq/L (ref 20.0–24.0)
Calcium, Ion: 1.13 mmol/L (ref 1.13–1.30)
HCT: 25 % — ABNORMAL LOW (ref 39.0–52.0)
Hemoglobin: 8.5 g/dL — ABNORMAL LOW (ref 13.0–17.0)
O2 SAT: 99 %
PO2 ART: 152 mmHg — AB (ref 80.0–100.0)
Patient temperature: 35.4
Potassium: 4.9 mEq/L (ref 3.7–5.3)
SODIUM: 137 meq/L (ref 137–147)
TCO2: 25 mmol/L (ref 0–100)
pCO2 arterial: 41 mmHg (ref 35.0–45.0)
pH, Arterial: 7.365 (ref 7.350–7.450)

## 2013-11-22 LAB — CBC
HCT: 27.4 % — ABNORMAL LOW (ref 39.0–52.0)
HCT: 37.1 % — ABNORMAL LOW (ref 39.0–52.0)
Hemoglobin: 10.2 g/dL — ABNORMAL LOW (ref 13.0–17.0)
Hemoglobin: 13.3 g/dL (ref 13.0–17.0)
MCH: 30.5 pg (ref 26.0–34.0)
MCH: 30.1 pg (ref 26.0–34.0)
MCHC: 37.2 g/dL — ABNORMAL HIGH (ref 30.0–36.0)
MCHC: 35.8 g/dL (ref 30.0–36.0)
MCV: 82.3 fL (ref 78.0–100.0)
MCV: 83.9 fL (ref 78.0–100.0)
PLATELETS: 230 10*3/uL (ref 150–400)
PLATELETS: 200 10*3/uL (ref 150–400)
RBC: 3.33 MIL/uL — ABNORMAL LOW (ref 4.22–5.81)
RBC: 4.42 MIL/uL (ref 4.22–5.81)
RDW: 14.3 % (ref 11.5–15.5)
RDW: 14.6 % (ref 11.5–15.5)
WBC: 15.6 10*3/uL — ABNORMAL HIGH (ref 4.0–10.5)
WBC: 17.4 10*3/uL — ABNORMAL HIGH (ref 4.0–10.5)

## 2013-11-22 LAB — BASIC METABOLIC PANEL
BUN: 12 mg/dL (ref 6–23)
CALCIUM: 7.4 mg/dL — AB (ref 8.4–10.5)
CO2: 19 mEq/L (ref 19–32)
CREATININE: 0.7 mg/dL (ref 0.50–1.35)
Chloride: 105 mEq/L (ref 96–112)
Glucose, Bld: 246 mg/dL — ABNORMAL HIGH (ref 70–99)
Potassium: 5.3 mEq/L (ref 3.7–5.3)
SODIUM: 137 meq/L (ref 137–147)

## 2013-11-22 LAB — BLOOD GAS, ARTERIAL
Acid-base deficit: 5.7 mmol/L — ABNORMAL HIGH (ref 0.0–2.0)
Bicarbonate: 19.9 mEq/L — ABNORMAL LOW (ref 20.0–24.0)
FIO2: 21 %
O2 SAT: 94.6 %
PCO2 ART: 44 mmHg (ref 35.0–45.0)
PO2 ART: 78.7 mmHg — AB (ref 80.0–100.0)
Patient temperature: 98.6
TCO2: 21.2 mmol/L (ref 0–100)
pH, Arterial: 7.278 — ABNORMAL LOW (ref 7.350–7.450)

## 2013-11-22 LAB — GLUCOSE, CAPILLARY
GLUCOSE-CAPILLARY: 186 mg/dL — AB (ref 70–99)
GLUCOSE-CAPILLARY: 166 mg/dL — AB (ref 70–99)
Glucose-Capillary: 177 mg/dL — ABNORMAL HIGH (ref 70–99)

## 2013-11-22 LAB — PROTIME-INR
INR: 1.39 (ref 0.00–1.49)
INR: 1.48 (ref 0.00–1.49)
PROTHROMBIN TIME: 16.7 s — AB (ref 11.6–15.2)
Prothrombin Time: 17.5 seconds — ABNORMAL HIGH (ref 11.6–15.2)

## 2013-11-22 LAB — MAGNESIUM: Magnesium: 1.1 mg/dL — ABNORMAL LOW (ref 1.5–2.5)

## 2013-11-22 LAB — APTT
aPTT: >200 seconds (ref 24–37)
aPTT: 28 seconds (ref 24–37)

## 2013-11-22 LAB — PREPARE RBC (CROSSMATCH)

## 2013-11-22 SURGERY — VISCERAL ANGIOGRAM
Anesthesia: LOCAL

## 2013-11-22 SURGERY — CREATION, BYPASS, ARTERIAL, AORTA TO FEMORAL, BILATERAL, USING GRAFT
Anesthesia: General | Site: Abdomen

## 2013-11-22 MED ORDER — GLYCOPYRROLATE 0.2 MG/ML IJ SOLN
INTRAMUSCULAR | Status: DC | PRN
  Administered 2013-11-22: 0.6 mg via INTRAVENOUS

## 2013-11-22 MED ORDER — VECURONIUM BROMIDE 10 MG IV SOLR
INTRAVENOUS | Status: DC | PRN
  Administered 2013-11-22 (×2): 1 mg via INTRAVENOUS
  Administered 2013-11-22 (×3): 2 mg via INTRAVENOUS
  Administered 2013-11-22 (×3): 1 mg via INTRAVENOUS

## 2013-11-22 MED ORDER — DOCUSATE SODIUM 100 MG PO CAPS: 100.0000 mg | ORAL_CAPSULE | Freq: Every day | ORAL | Status: DC

## 2013-11-22 MED ORDER — SENNOSIDES-DOCUSATE SODIUM 8.6-50 MG PO TABS
1.0000 | ORAL_TABLET | Freq: Every evening | ORAL | Status: DC | PRN
Start: 2013-11-22 — End: 2013-12-04
  Administered 2013-12-02 – 2013-12-03 (×2): 1 via ORAL
  Filled 2013-11-22 (×3): qty 1

## 2013-11-22 MED ORDER — PAPAVERINE HCL 30 MG/ML IJ SOLN
INTRAMUSCULAR | Status: AC
  Filled 2013-11-22: qty 2

## 2013-11-22 MED ORDER — LACTATED RINGERS IV SOLN
INTRAVENOUS | Status: DC | PRN
  Administered 2013-11-22: 11:00:00 via INTRAVENOUS

## 2013-11-22 MED ORDER — HYDROMORPHONE 0.3 MG/ML IV SOLN
INTRAVENOUS | Status: AC
  Filled 2013-11-22: qty 25

## 2013-11-22 MED ORDER — 0.9 % SODIUM CHLORIDE (POUR BTL) OPTIME
TOPICAL | Status: DC | PRN
  Administered 2013-11-22: 3000 mL

## 2013-11-22 MED ORDER — ROCURONIUM BROMIDE 100 MG/10ML IV SOLN
INTRAVENOUS | Status: DC | PRN
  Administered 2013-11-22: 50 mg via INTRAVENOUS

## 2013-11-22 MED ORDER — HYDRALAZINE HCL 20 MG/ML IJ SOLN
10.0000 mg | INTRAMUSCULAR | Status: DC | PRN
  Administered 2013-11-27: 10 mg via INTRAVENOUS
  Filled 2013-11-22: qty 1

## 2013-11-22 MED ORDER — ALUM & MAG HYDROXIDE-SIMETH 200-200-20 MG/5ML PO SUSP: 15.0000 mL | ORAL | Status: DC | PRN

## 2013-11-22 MED ORDER — HYDROMORPHONE HCL PF 1 MG/ML IJ SOLN
INTRAMUSCULAR | Status: AC
  Filled 2013-11-22: qty 1

## 2013-11-22 MED ORDER — LABETALOL HCL 5 MG/ML IV SOLN
10.0000 mg | INTRAVENOUS | Status: DC | PRN
  Filled 2013-11-22: qty 4

## 2013-11-22 MED ORDER — METRONIDAZOLE IN NACL 5-0.79 MG/ML-% IV SOLN
500.0000 mg | Freq: Four times a day (QID) | INTRAVENOUS | Status: DC
  Administered 2013-11-23 – 2013-11-25 (×10): 500 mg via INTRAVENOUS
  Filled 2013-11-22 (×12): qty 100

## 2013-11-22 MED ORDER — HEMOSTATIC AGENTS (NO CHARGE) OPTIME
TOPICAL | Status: DC | PRN
  Administered 2013-11-22: 3 via TOPICAL

## 2013-11-22 MED ORDER — ONDANSETRON HCL 4 MG/2ML IJ SOLN: 4.0000 mg | Freq: Four times a day (QID) | INTRAMUSCULAR | Status: DC | PRN

## 2013-11-22 MED ORDER — PHENYLEPHRINE HCL 10 MG/ML IJ SOLN
10.0000 mg | INTRAMUSCULAR | Status: DC | PRN
  Administered 2013-11-22: 10 ug/min via INTRAVENOUS

## 2013-11-22 MED ORDER — LACTATED RINGERS IV SOLN
INTRAVENOUS | Status: DC
  Administered 2013-11-22: 11:00:00 via INTRAVENOUS

## 2013-11-22 MED ORDER — ACETAMINOPHEN 325 MG PO TABS
325.0000 mg | ORAL_TABLET | ORAL | Status: DC | PRN
  Filled 2013-11-22: qty 2

## 2013-11-22 MED ORDER — POTASSIUM CHLORIDE CRYS ER 20 MEQ PO TBCR
20.0000 meq | EXTENDED_RELEASE_TABLET | Freq: Every day | ORAL | Status: AC | PRN
  Administered 2013-12-01: 40 meq via ORAL
  Filled 2013-11-22: qty 2

## 2013-11-22 MED ORDER — HYDROMORPHONE HCL PF 1 MG/ML IJ SOLN
0.2500 mg | INTRAMUSCULAR | Status: DC | PRN
  Administered 2013-11-22 (×4): 0.5 mg via INTRAVENOUS

## 2013-11-22 MED ORDER — ONDANSETRON HCL 4 MG/2ML IJ SOLN
4.0000 mg | Freq: Four times a day (QID) | INTRAMUSCULAR | Status: DC | PRN
  Administered 2013-11-23: 4 mg via INTRAVENOUS
  Filled 2013-11-22: qty 2

## 2013-11-22 MED ORDER — HYDROMORPHONE HCL PF 1 MG/ML IJ SOLN
0.5000 mg | INTRAMUSCULAR | Status: DC | PRN
  Administered 2013-11-23 (×2): 0.5 mg via INTRAVENOUS
  Administered 2013-11-24 (×2): 1 mg via INTRAVENOUS
  Administered 2013-11-24 (×3): 0.5 mg via INTRAVENOUS
  Administered 2013-11-24: 1 mg via INTRAVENOUS
  Administered 2013-11-24: 0.5 mg via INTRAVENOUS
  Administered 2013-11-24 – 2013-12-04 (×34): 1 mg via INTRAVENOUS
  Filled 2013-11-22 (×43): qty 1

## 2013-11-22 MED ORDER — SODIUM CHLORIDE 0.9 % IV SOLN: 500.0000 mL | Freq: Once | INTRAVENOUS | Status: AC | PRN

## 2013-11-22 MED ORDER — GUAIFENESIN-DM 100-10 MG/5ML PO SYRP: 15.0000 mL | ORAL_SOLUTION | ORAL | Status: DC | PRN

## 2013-11-22 MED ORDER — HEPARIN (PORCINE) IN NACL 2-0.9 UNIT/ML-% IJ SOLN
INTRAMUSCULAR | Status: AC
  Filled 2013-11-22: qty 1000

## 2013-11-22 MED ORDER — SODIUM CHLORIDE 0.9 % IR SOLN
Status: DC | PRN
  Administered 2013-11-22: 11:00:00

## 2013-11-22 MED ORDER — DOPAMINE-DEXTROSE 3.2-5 MG/ML-% IV SOLN: 3.0000 ug/kg/min | INTRAVENOUS | Status: DC

## 2013-11-22 MED ORDER — SODIUM CHLORIDE 0.9 % IV SOLN
INTRAVENOUS | Status: DC | PRN
  Administered 2013-11-22: 17:00:00 via INTRAVENOUS

## 2013-11-22 MED ORDER — METOPROLOL TARTRATE 1 MG/ML IV SOLN: 2.0000 mg | INTRAVENOUS | Status: DC | PRN

## 2013-11-22 MED ORDER — PANTOPRAZOLE SODIUM 40 MG PO TBEC
40.0000 mg | DELAYED_RELEASE_TABLET | Freq: Every day | ORAL | Status: DC
  Administered 2013-11-25: 40 mg via ORAL
  Filled 2013-11-22: qty 1

## 2013-11-22 MED ORDER — DIPHENHYDRAMINE HCL 12.5 MG/5ML PO ELIX
12.5000 mg | ORAL_SOLUTION | Freq: Four times a day (QID) | ORAL | Status: DC | PRN
  Filled 2013-11-22: qty 5

## 2013-11-22 MED ORDER — ALBUMIN HUMAN 5 % IV SOLN
INTRAVENOUS | Status: DC | PRN
  Administered 2013-11-22 (×2): via INTRAVENOUS

## 2013-11-22 MED ORDER — MIDAZOLAM HCL 5 MG/5ML IJ SOLN
INTRAMUSCULAR | Status: DC | PRN
  Administered 2013-11-22 (×2): 1 mg via INTRAVENOUS

## 2013-11-22 MED ORDER — SODIUM CHLORIDE 0.9 % IV SOLN
INTRAVENOUS | Status: DC
  Administered 2013-11-22: 125 mL via INTRAVENOUS
  Administered 2013-11-23: 15:00:00 via INTRAVENOUS
  Administered 2013-11-23: 500 mL via INTRAVENOUS
  Administered 2013-11-24: 125 mL/h via INTRAVENOUS
  Administered 2013-11-25: 125 mL via INTRAVENOUS
  Administered 2013-11-25 – 2013-11-26 (×3): via INTRAVENOUS

## 2013-11-22 MED ORDER — HYDROMORPHONE 0.3 MG/ML IV SOLN
INTRAVENOUS | Status: DC
  Administered 2013-11-23: 2.1 mg via INTRAVENOUS
  Administered 2013-11-23 (×2): 2.8 mg via INTRAVENOUS
  Administered 2013-11-23: 3 mg via INTRAVENOUS
  Administered 2013-11-23: 06:00:00 via INTRAVENOUS
  Filled 2013-11-22: qty 25

## 2013-11-22 MED ORDER — ONDANSETRON HCL 4 MG/2ML IJ SOLN
INTRAMUSCULAR | Status: DC | PRN
  Administered 2013-11-22: 4 mg via INTRAVENOUS

## 2013-11-22 MED ORDER — OXYCODONE HCL 5 MG PO TABS: 5.0000 mg | ORAL_TABLET | Freq: Once | ORAL | Status: DC | PRN

## 2013-11-22 MED ORDER — SODIUM CHLORIDE 0.9 % IV SOLN: INTRAVENOUS | Status: DC

## 2013-11-22 MED ORDER — SODIUM CHLORIDE 0.9 % IJ SOLN: 9.0000 mL | INTRAMUSCULAR | Status: DC | PRN

## 2013-11-22 MED ORDER — ESMOLOL HCL 10 MG/ML IV SOLN
INTRAVENOUS | Status: DC | PRN
  Administered 2013-11-22: 10 mg via INTRAVENOUS

## 2013-11-22 MED ORDER — NEOSTIGMINE METHYLSULFATE 1 MG/ML IJ SOLN
INTRAMUSCULAR | Status: DC | PRN
  Administered 2013-11-22: 4 mg via INTRAVENOUS

## 2013-11-22 MED ORDER — HYDRALAZINE HCL 20 MG/ML IJ SOLN: 10.0000 mg | INTRAMUSCULAR | Status: DC | PRN

## 2013-11-22 MED ORDER — ARTIFICIAL TEARS OP OINT
TOPICAL_OINTMENT | OPHTHALMIC | Status: DC | PRN
  Administered 2013-11-22: 1 via OPHTHALMIC

## 2013-11-22 MED ORDER — SODIUM CHLORIDE 0.9 % IV SOLN
INTRAVENOUS | Status: DC | PRN
  Administered 2013-11-22: 16:00:00 via INTRAVENOUS

## 2013-11-22 MED ORDER — LIDOCAINE HCL (PF) 1 % IJ SOLN
INTRAMUSCULAR | Status: AC
  Filled 2013-11-22: qty 30

## 2013-11-22 MED ORDER — INSULIN ASPART 100 UNIT/ML ~~LOC~~ SOLN: 0.0000 [IU] | SUBCUTANEOUS | Status: DC

## 2013-11-22 MED ORDER — HEPARIN SODIUM (PORCINE) 1000 UNIT/ML IJ SOLN
INTRAMUSCULAR | Status: DC | PRN
  Administered 2013-11-22: 6000 [IU] via INTRAVENOUS
  Administered 2013-11-22 (×2): 2000 [IU] via INTRAVENOUS
  Administered 2013-11-22: 1000 [IU] via INTRAVENOUS

## 2013-11-22 MED ORDER — INSULIN ASPART 100 UNIT/ML ~~LOC~~ SOLN: 0.0000 [IU] | Freq: Three times a day (TID) | SUBCUTANEOUS | Status: DC

## 2013-11-22 MED ORDER — ENOXAPARIN SODIUM 30 MG/0.3ML ~~LOC~~ SOLN
30.0000 mg | SUBCUTANEOUS | Status: DC
  Administered 2013-11-22 – 2013-11-26 (×5): 30 mg via SUBCUTANEOUS
  Filled 2013-11-22 (×6): qty 0.3

## 2013-11-22 MED ORDER — PHENOL 1.4 % MT LIQD: 1.0000 | OROMUCOSAL | Status: DC | PRN

## 2013-11-22 MED ORDER — PHENYLEPHRINE HCL 10 MG/ML IJ SOLN
INTRAMUSCULAR | Status: DC | PRN
  Administered 2013-11-22: 80 ug via INTRAVENOUS
  Administered 2013-11-22: 40 ug via INTRAVENOUS
  Administered 2013-11-22: 80 ug via INTRAVENOUS

## 2013-11-22 MED ORDER — CIPROFLOXACIN IN D5W 400 MG/200ML IV SOLN
400.0000 mg | Freq: Two times a day (BID) | INTRAVENOUS | Status: DC
  Administered 2013-11-22 – 2013-11-24 (×5): 400 mg via INTRAVENOUS
  Filled 2013-11-22 (×6): qty 200

## 2013-11-22 MED ORDER — NALOXONE HCL 0.4 MG/ML IJ SOLN: 0.4000 mg | INTRAMUSCULAR | Status: DC | PRN

## 2013-11-22 MED ORDER — FENTANYL CITRATE 0.05 MG/ML IJ SOLN
INTRAMUSCULAR | Status: DC | PRN
  Administered 2013-11-22 (×3): 50 ug via INTRAVENOUS
  Administered 2013-11-22: 100 ug via INTRAVENOUS
  Administered 2013-11-22: 50 ug via INTRAVENOUS
  Administered 2013-11-22: 100 ug via INTRAVENOUS
  Administered 2013-11-22 (×4): 50 ug via INTRAVENOUS
  Administered 2013-11-22: 100 ug via INTRAVENOUS
  Administered 2013-11-22: 50 ug via INTRAVENOUS

## 2013-11-22 MED ORDER — LABETALOL HCL 5 MG/ML IV SOLN
10.0000 mg | INTRAVENOUS | Status: DC | PRN
  Administered 2013-12-03 – 2013-12-04 (×2): 10 mg via INTRAVENOUS
  Filled 2013-11-22: qty 4

## 2013-11-22 MED ORDER — DIPHENHYDRAMINE HCL 50 MG/ML IJ SOLN: 12.5000 mg | Freq: Four times a day (QID) | INTRAMUSCULAR | Status: DC | PRN

## 2013-11-22 MED ORDER — BISACODYL 10 MG RE SUPP
10.0000 mg | Freq: Every day | RECTAL | Status: DC | PRN
Start: 2013-11-22 — End: 2013-12-04
  Administered 2013-11-28 – 2013-12-03 (×3): 10 mg via RECTAL
  Filled 2013-11-22 (×3): qty 1

## 2013-11-22 MED ORDER — MAGNESIUM SULFATE 40 MG/ML IJ SOLN
2.0000 g | Freq: Every day | INTRAMUSCULAR | Status: DC | PRN
  Filled 2013-11-22: qty 50

## 2013-11-22 MED ORDER — PROTAMINE SULFATE 10 MG/ML IV SOLN
INTRAVENOUS | Status: DC | PRN
  Administered 2013-11-22 (×5): 10 mg via INTRAVENOUS

## 2013-11-22 MED ORDER — PROPOFOL 10 MG/ML IV BOLUS
INTRAVENOUS | Status: DC | PRN
  Administered 2013-11-22: 130 mg via INTRAVENOUS

## 2013-11-22 MED ORDER — ACETAMINOPHEN 650 MG RE SUPP: 325.0000 mg | RECTAL | Status: DC | PRN

## 2013-11-22 MED ORDER — DEXTROSE 5 % IV SOLN
1.5000 g | INTRAVENOUS | Status: AC
  Filled 2013-11-22: qty 1.5

## 2013-11-22 MED ORDER — INSULIN ASPART 100 UNIT/ML ~~LOC~~ SOLN
0.0000 [IU] | SUBCUTANEOUS | Status: DC
  Administered 2013-11-22: 5 [IU] via SUBCUTANEOUS

## 2013-11-22 MED ORDER — LACTATED RINGERS IV SOLN
INTRAVENOUS | Status: DC
  Administered 2013-11-22: 09:00:00 via INTRAVENOUS

## 2013-11-22 MED ORDER — OXYCODONE HCL 5 MG/5ML PO SOLN: 5.0000 mg | Freq: Once | ORAL | Status: DC | PRN

## 2013-11-22 SURGICAL SUPPLY — 95 items
BANDAGE ELASTIC 4 VELCRO ST LF (GAUZE/BANDAGES/DRESSINGS) IMPLANT
BANDAGE ESMARK 6X9 LF (GAUZE/BANDAGES/DRESSINGS) IMPLANT
BLADE SURG ROTATE 9660 (MISCELLANEOUS) ×3 IMPLANT
BNDG ESMARK 6X9 LF (GAUZE/BANDAGES/DRESSINGS)
CANISTER SUCTION 2500CC (MISCELLANEOUS) ×3 IMPLANT
CLIP TI MEDIUM 24 (CLIP) ×3 IMPLANT
CLIP TI MEDIUM 6 (CLIP) ×3 IMPLANT
CLIP TI WIDE RED SMALL 24 (CLIP) ×3 IMPLANT
COVER MAYO STAND STRL (DRAPES) ×3 IMPLANT
COVER SURGICAL LIGHT HANDLE (MISCELLANEOUS) ×3 IMPLANT
CUFF TOURNIQUET SINGLE 24IN (TOURNIQUET CUFF) IMPLANT
CUFF TOURNIQUET SINGLE 34IN LL (TOURNIQUET CUFF) IMPLANT
CUFF TOURNIQUET SINGLE 44IN (TOURNIQUET CUFF) IMPLANT
DERMABOND ADVANCED (GAUZE/BANDAGES/DRESSINGS) ×2
DERMABOND ADVANCED .7 DNX12 (GAUZE/BANDAGES/DRESSINGS) ×4 IMPLANT
DRAIN CHANNEL 15F RND FF W/TCR (WOUND CARE) IMPLANT
DRAPE WARM FLUID 44X44 (DRAPE) ×3 IMPLANT
DRAPE X-RAY CASS 24X20 (DRAPES) IMPLANT
DRSG COVADERM 4X10 (GAUZE/BANDAGES/DRESSINGS) IMPLANT
DRSG COVADERM 4X8 (GAUZE/BANDAGES/DRESSINGS) IMPLANT
ELECT BLADE 4.0 EZ CLEAN MEGAD (MISCELLANEOUS) ×3
ELECT BLADE 6.5 EXT (BLADE) ×3 IMPLANT
ELECT REM PT RETURN 9FT ADLT (ELECTROSURGICAL) ×3
ELECTRODE BLDE 4.0 EZ CLN MEGD (MISCELLANEOUS) ×2 IMPLANT
ELECTRODE REM PT RTRN 9FT ADLT (ELECTROSURGICAL) ×2 IMPLANT
EVACUATOR SILICONE 100CC (DRAIN) IMPLANT
GLOVE BIOGEL PI IND STRL 6.5 (GLOVE) ×4 IMPLANT
GLOVE BIOGEL PI IND STRL 7.0 (GLOVE) ×2 IMPLANT
GLOVE BIOGEL PI IND STRL 7.5 (GLOVE) ×8 IMPLANT
GLOVE BIOGEL PI IND STRL 8 (GLOVE) ×4 IMPLANT
GLOVE BIOGEL PI INDICATOR 6.5 (GLOVE) ×2
GLOVE BIOGEL PI INDICATOR 7.0 (GLOVE) ×1
GLOVE BIOGEL PI INDICATOR 7.5 (GLOVE) ×4
GLOVE BIOGEL PI INDICATOR 8 (GLOVE) ×2
GLOVE SS BIOGEL STRL SZ 7 (GLOVE) ×6 IMPLANT
GLOVE SUPERSENSE BIOGEL SZ 7 (GLOVE) ×3
GLOVE SURG SS PI 7.0 STRL IVOR (GLOVE) ×9 IMPLANT
GLOVE SURG SS PI 7.5 STRL IVOR (GLOVE) ×3 IMPLANT
GOWN PREVENTION PLUS XXLARGE (GOWN DISPOSABLE) ×3 IMPLANT
GOWN STRL NON-REIN LRG LVL3 (GOWN DISPOSABLE) ×12 IMPLANT
GOWN STRL REIN XL XLG (GOWN DISPOSABLE) ×15 IMPLANT
GRAFT CV STRG 30X7KNIT SFT (Vascular Products) ×2 IMPLANT
GRAFT HEMASHIELD 14X7MM (Vascular Products) ×3 IMPLANT
GRAFT HEMASHIELD 7MM (Vascular Products) ×1 IMPLANT
HEMOSTAT SNOW SURGICEL 2X4 (HEMOSTASIS) ×9 IMPLANT
INSERT FOGARTY 61MM (MISCELLANEOUS) ×3 IMPLANT
INSERT FOGARTY SM (MISCELLANEOUS) ×6 IMPLANT
KIT BASIN OR (CUSTOM PROCEDURE TRAY) ×3 IMPLANT
KIT ROOM TURNOVER OR (KITS) ×3 IMPLANT
LOOP VESSEL MINI RED (MISCELLANEOUS) ×9 IMPLANT
MARKER GRAFT CORONARY BYPASS (MISCELLANEOUS) IMPLANT
NS IRRIG 1000ML POUR BTL (IV SOLUTION) ×6 IMPLANT
PACK AORTA (CUSTOM PROCEDURE TRAY) ×3 IMPLANT
PACK PERIPHERAL VASCULAR (CUSTOM PROCEDURE TRAY) ×3 IMPLANT
PAD ARMBOARD 7.5X6 YLW CONV (MISCELLANEOUS) ×6 IMPLANT
PADDING CAST COTTON 6X4 STRL (CAST SUPPLIES) IMPLANT
PUNCH AORTIC ROTATE 5MM 8IN (MISCELLANEOUS) ×3 IMPLANT
SET COLLECT BLD 21X3/4 12 (NEEDLE) IMPLANT
SPONGE GAUZE 4X4 12PLY (GAUZE/BANDAGES/DRESSINGS) ×6 IMPLANT
SPONGE LAP 18X18 X RAY DECT (DISPOSABLE) ×12 IMPLANT
STOPCOCK 4 WAY LG BORE MALE ST (IV SETS) IMPLANT
SUT ETHIBOND 5 LR DA (SUTURE) IMPLANT
SUT ETHILON 3 0 PS 1 (SUTURE) IMPLANT
SUT PDS AB 1 TP1 54 (SUTURE) ×6 IMPLANT
SUT PROLENE 3 0 SH 48 (SUTURE) ×15 IMPLANT
SUT PROLENE 5 0 C 1 24 (SUTURE) ×15 IMPLANT
SUT PROLENE 5 0 C 1 36 (SUTURE) ×6 IMPLANT
SUT PROLENE 5 0 CC1 (SUTURE) ×9 IMPLANT
SUT PROLENE 6 0 BV (SUTURE) ×18 IMPLANT
SUT PROLENE 6 0 CC (SUTURE) ×3 IMPLANT
SUT PROLENE 7 0 BV 1 (SUTURE) IMPLANT
SUT SILK 2 0 (SUTURE) ×2
SUT SILK 2 0 SH (SUTURE) ×3 IMPLANT
SUT SILK 2 0 TIES 17X18 (SUTURE) ×1
SUT SILK 2 0SH CR/8 30 (SUTURE) ×3 IMPLANT
SUT SILK 2-0 18XBRD TIE 12 (SUTURE) ×4 IMPLANT
SUT SILK 2-0 18XBRD TIE BLK (SUTURE) ×2 IMPLANT
SUT SILK 3 0 (SUTURE) ×2
SUT SILK 3 0 TIES 17X18 (SUTURE) ×1
SUT SILK 3-0 18XBRD TIE 12 (SUTURE) ×4 IMPLANT
SUT SILK 3-0 18XBRD TIE BLK (SUTURE) ×2 IMPLANT
SUT VIC AB 2-0 CT1 27 (SUTURE) ×4
SUT VIC AB 2-0 CT1 TAPERPNT 27 (SUTURE) ×8 IMPLANT
SUT VIC AB 2-0 CTX 36 (SUTURE) ×3 IMPLANT
SUT VIC AB 3-0 SH 27 (SUTURE) ×3
SUT VIC AB 3-0 SH 27X BRD (SUTURE) ×6 IMPLANT
SUT VICRYL 4-0 PS2 18IN ABS (SUTURE) ×9 IMPLANT
TAPE CLOTH SURG 4X10 WHT LF (GAUZE/BANDAGES/DRESSINGS) ×6 IMPLANT
TOWEL BLUE STERILE X RAY DET (MISCELLANEOUS) ×6 IMPLANT
TOWEL OR 17X24 6PK STRL BLUE (TOWEL DISPOSABLE) ×6 IMPLANT
TOWEL OR 17X26 10 PK STRL BLUE (TOWEL DISPOSABLE) ×6 IMPLANT
TRAY FOLEY CATH 16FRSI W/METER (SET/KITS/TRAYS/PACK) ×3 IMPLANT
TUBING EXTENTION W/L.L. (IV SETS) IMPLANT
UNDERPAD 30X30 INCONTINENT (UNDERPADS AND DIAPERS) ×3 IMPLANT
WATER STERILE IRR 1000ML POUR (IV SOLUTION) ×6 IMPLANT

## 2013-11-22 NOTE — Op Note (Signed)
Procedure: Aortogram, mesenteric angiogram  Preoperative Diagnosis: Chronic mesenteric ischemia  Postoperative diagnosis: Same  Anesthesia: Local  Operative details: After obtaining informed consent, the patient was taken to the [place] lab. The patient was placed in supine position the Angio table. Both groins were prepped and draped in usual sterile fashion. Local anesthesia was attributed of the right common femoral artery. Ultrasound was used to identify the right common femoral artery. There was significant calcific plaque within the right common femoral artery. I was able to find a window for an access site in the common femoral artery. An introducer needle was used to cannulate the right common femoral artery. An 035 first core was then threaded up into the right iliac system. A 5 French sheath was placed over the guidewire into the right femoral artery. This was thoroughly flushed with heparinized saline. The guidewire would not easily advance across the right common iliac origin. Therefore a 5 French pigtail catheter was advanced up to the right common iliac for extra support. The guidewire then easily advanced into the abdominal aorta. A 5 French pigtail catheter was advanced up above the level of the mesenteric vessels. An AP aortogram was obtained. This showed a patent celiac artery. There was retrograde filling of the superior mesenteric artery. The left and right renal arteries are patent. The infrarenal abdominal aorta is severely diseased with multiple areas of calcific plaque in multiple areas of narrowing. The inferior mesenteric artery is large and meandering and has an origin stenosis. A lateral aortogram was also performed. This shows a 80% celiac origin stenosis. The superior mesenteric artery is occluded at its origin. This does fill retrograde from the inferior mesenteric artery. Next a selective catheterization was performed of the celiac artery. The pigtail catheter was exchanged over a  guidewire for a 5 [place] Sos catheter. There was some calcium at the origin of the vessel but the catheter did pass fairly easily. On selective injection all celiac branches are filling and there is normal anatomic configuration. The SMA is not visualized. I did make an attempt to try to selectively catheterize the inferior mesenteric artery. However due to significant fraction between the tight right iliac system and infrarenal abdominal aorta I was unable to manipulate the catheter easily. At this point it was decided to remove the catheter. This was removed over guidewire. Patient tolerated procedure well there were no complications.  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 Trula Slade later today.  Ruta Hinds, MD [facility] Office: [phone] Pager: [phone]

## 2013-11-22 NOTE — Interval H&P Note (Signed)
History and Physical Interval Note:  11/22/2013 7:44 AM  [place]  has presented today for surgery, with the diagnosis of PVD  The various methods of treatment have been discussed with the patient and family. After consideration of risks, benefits and other options for treatment, the patient has consented to  Procedure(s): VISCERAL ANGIOGRAM (N/A) as a surgical intervention .  The patient's history has been reviewed, patient examined, no change in status, stable for surgery.  I have reviewed the patient's chart and labs.  Questions were answered to the patient's satisfaction.     Hadiya Spoerl E

## 2013-11-22 NOTE — Preoperative (Signed)
Beta Blockers   Reason not to administer Beta Blockers:Not Applicable 

## 2013-11-22 NOTE — Anesthesia Preprocedure Evaluation (Signed)
Anesthesia Evaluation  Patient identified by MRN, date of birth, ID band Patient awake    Reviewed: Allergy & Precautions, H&P , NPO status , Patient's Chart, lab work & pertinent test results  Airway Mallampati: II  Neck ROM: full    Dental   Pulmonary COPDCurrent Smoker,          Cardiovascular hypertension, + CAD and + Peripheral Vascular Disease     Neuro/Psych  Headaches,    GI/Hepatic GERD-  ,  Endo/Other  diabetes, Type 2  Renal/GU      Musculoskeletal   Abdominal   Peds  Hematology   Anesthesia Other Findings   Reproductive/Obstetrics                           Anesthesia Physical Anesthesia Plan  ASA: III  Anesthesia Plan: General   Post-op Pain Management:    Induction: Intravenous  Airway Management Planned: Oral ETT  Additional Equipment: Arterial line and CVP  Intra-op Plan:   Post-operative Plan: Extubation in OR  Informed Consent: I have reviewed the patients History and Physical, chart, labs and discussed the procedure including the risks, benefits and alternatives for the proposed anesthesia with the patient or authorized representative who has indicated his/her understanding and acceptance.     Plan Discussed with: CRNA, Anesthesiologist and Surgeon  Anesthesia Plan Comments:         Anesthesia Quick Evaluation

## 2013-11-22 NOTE — Transfer of Care (Signed)
Immediate Anesthesia Transfer of Care Note  Patient: Perry Kennedy  Procedure(s) Performed: Procedure(s): AORTA BIFEMORAL BYPASS GRAFT; SUPERIOR MESENTERIC ARTERY BYPASS, INFERIOR MESENTERIC BYPASS (N/A)  Patient Location: PACU  Anesthesia Type:General  Level of Consciousness: awake, alert  and oriented  Airway & Oxygen Therapy: Patient Spontanous Breathing and Patient connected to nasal cannula oxygen  Post-op Assessment: Report given to PACU RN and Post -op Vital signs reviewed and stable  Post vital signs: Reviewed and stable  Complications: No apparent anesthesia complications

## 2013-11-22 NOTE — H&P (View-Only) (Signed)
Patient name: Perry Kennedy MRN: [id number] DOB: December 15, 1940 Sex: male   Referred by: Dr. Percival Spanish  Reason for referral:  Chief Complaint  Patient presents with  . New Evaluation    consult for surgery    HISTORY OF PRESENT ILLNESS: This is a very pleasant 73 year old gentleman who is here today for violation of lower extremity peripheral vascular disease.  The patient recently underwent angiography which revealed an occluded left iliac system and a highly diseased right iliac system.  He also has an occluded left superficial femoral artery.  He has significant limitations with his activity.  He also complains of pain in his left hip.  He states that after he she also has significant pain in his abdomen.  He will also abdominal pain when he does not deep.  The patient has significant coronary disease.  He recently underwent cardiac catheterization and was cleared for surgery. The patient suffers from hypercholesterolemia and is managed with a stent.  He is on an ACE inhibitor for his hypertension.  He has COPD and continues to smoke was no desire to quit.   Past Medical History  Diagnosis Date  . Diabetes mellitus without complication   . Hyperlipidemia   . GERD (gastroesophageal reflux disease)   . Hypertension   . COPD (chronic obstructive pulmonary disease)   . Insomnia   . Gout   . DVT (deep venous thrombosis)   . Peripheral vascular disease     Past Surgical History  Procedure Laterality Date  . Hemicolectomy      History   Social History  . Marital Status: Divorced    Spouse Name: N/A    Number of Children: 3  . Years of Education: N/A   Occupational History  . Not on file.   Social History Main Topics  . Smoking status: Current Every Day Smoker -- 2.00 packs/day for 55 years    Types: Cigarettes  . Smokeless tobacco: Not on file  . Alcohol Use: No  . Drug Use: No  . Sexual Activity: Not on file   Other Topics Concern  . Not on file   Social  History Narrative   Lives alone.     Family History  Problem Relation Age of Onset  . Heart disease Father 29    pacemaker  . Diabetes Mother     renal failure  . Heart disease Mother   . CAD Brother     died at 19  . COPD Brother   . Heart disease Brother   . Hyperlipidemia Daughter   . Diabetes Son   . Heart disease Son     before age 72  . Heart attack Son     Allergies as of 11/18/2013 - Review Complete 11/18/2013  Allergen Reaction Noted  . Morphine and related Other (See Comments) 10/23/2013    Current Outpatient Prescriptions on File Prior to Visit  Medication Sig Dispense Refill  . aspirin 325 MG tablet Take 325 mg by mouth daily.      Marland Kitchen atorvastatin (LIPITOR) 40 MG tablet Take 1 tablet (40 mg total) by mouth daily.  30 tablet  2  . cilostazol (PLETAL) 100 MG tablet Take 1 tablet (100 mg total) by mouth 2 (two) times daily.  60 tablet  2  . ciprofloxacin (CIPRO) 500 MG tablet TAKE ONE TABLET BY MOUTH TWICE DAILY  20 tablet  0  . diclofenac (VOLTAREN) 75 MG EC tablet Take 1 tablet (75 mg total) by mouth  2 (two) times daily.  60 tablet  3  . lisinopril (PRINIVIL,ZESTRIL) 20 MG tablet Take 1 tablet (20 mg total) by mouth daily.  30 tablet  2  . metroNIDAZOLE (FLAGYL) 500 MG tablet TAKE ONE TABLET BY MOUTH THREE TIMES DAILY  21 tablet  0  . omeprazole (PRILOSEC) 20 MG capsule Take 1 capsule (20 mg total) by mouth daily.  30 capsule  5  . ranitidine (ZANTAC) 150 MG tablet Take 1 tablet (150 mg total) by mouth 2 (two) times daily.  60 tablet  5   No current facility-administered medications on file prior to visit.     REVIEW OF SYSTEMS: Cardiovascular: Positive for chest pain, pain in legs were groin flap and leg swelling Pulmonary: No productive cough, asthma or wheezing. Neurologic: Positive for weakness and numbness in his leg Hematologic: No bleeding problems or clotting disorders. Musculoskeletal: No joint pain or joint swelling. Gastrointestinal: No blood in  stool or hematemesis Genitourinary: No dysuria or hematuria. Psychiatric:: No history of major depression. Integumentary: No rashes or ulcers. Constitutional: No fever or chills.  PHYSICAL EXAMINATION: General: The patient appears their stated age.  Vital signs are BP 132/89  Pulse 107  Ht 5\' 8"  (1.727 m)  Wt 141 lb 14.4 oz (64.365 kg)  BMI 21.58 kg/m2  SpO2 98% HEENT:  No gross abnormalities Pulmonary: Respirations are non-labored Abdomen: Soft and non-tender  Musculoskeletal: There are no major deformities.   Neurologic: No focal weakness or paresthesias are detected, Skin: There are no ulcer or rashes noted. Psychiatric: The patient has normal affect. Cardiovascular: There is a regular rate and rhythm without significant murmur appreciated.  No carotid bruit  Diagnostic Studies: I have reviewed his angiogram which shows severe aortoiliac occlusive disease.  I have also reviewed his CT scan from one month ago which shows significant calcification within his superior mesenteric and celiac arteries    Assessment:  Severe peripheral vascular disease with aortoiliac Plan: I discussed our options for surgical revascularization.  These include extra-anatomic vs. an aortobifemoral bypass graft.  After full discussion I feel the best option is to proceed with an aortobifemoral bypass graft.  I discussed the risks and benefits with the patient which include but are not limited to pulmonary complications, especially pneumonia given his smoking history, and death, bleeding, the need for additional operations.  I am somewhat concerned that he may also have mesenteric angina which is causing his abdominal pain with eating.  Therefore I will plan on shooting an abdominal aortogram while he is on the table and then considering whether or not to proceed with mesenteric revascularization during the time of his aortobifemoral bypass graft.  He was to this done as soon as possible.  His operation is  been scheduled for this Friday, January 2.  He is going to stop his cilostazol today.     Eldridge Abrahams, M.D. [facility] Office: [phone] Pager:  [phone]

## 2013-11-22 NOTE — Interval H&P Note (Signed)
History and Physical Interval Note:  11/22/2013 10:46 AM  Mortimer Fries L Scheck  has presented today for surgery, with the diagnosis of Peripheral Vascular Disease Aorto Iliac Occlusive Disease Abdominal Pain  The various methods of treatment have been discussed with the patient and family. After consideration of risks, benefits and other options for treatment, the patient has consented to  Procedure(s): AORTA BIFEMORAL BYPASS GRAFT; POSSIBLE SUPERIOR MESENTERIC ARTERY BYPASS (N/A) ABDOMINAL AORTOGRAM (N/A) as a surgical intervention .  The patient's history has been reviewed, patient examined, no change in status, stable for surgery.  I have reviewed the patient's chart and labs.  Questions were answered to the patient's satisfaction.     Venicia Vandall IV, [place]  I have reviewed the angio by Dr. Oneida Alar.  He has SMA occlusion and Celiac stenosis which is likely responsible for his abdominal pain.  I will plan on SMA bypass in conjunction with ABF   Wells Tres Grzywacz

## 2013-11-22 NOTE — Op Note (Signed)
Patient name: Perry Kennedy MRN: [id number] DOB: 31-Oct-1941 Sex: male  11/22/2013 Pre-operative Diagnosis: #1: Bilateral claudication #2: Chronic mesenteric ischemia Post-operative diagnosis:  Same Surgeon:  Eldridge Abrahams Assistants:  Victorino Dike, M.D.  Lennie Muckle, PA Procedure:   #1: Aortobifemoral bypass graft with 14 x 7 dacryon graft   #2: Extensive right common femoral and profunda femoral endarterectomy   #3: Aorta to superior mesenteric artery bypass graft with 7 mm dacryon   #4: Reimplantation of the inferior mesenteric artery Anesthesia:  Gen. Blood Loss:  See anesthesia record Specimens:  None  Findings:  End to end proximal anastomosis.  The right femoral anastomosis was to the distal common femoral artery and about 2 cm of the profunda femoral artery.  I had to perform an extensive endarterectomy of the distal common femoral as well as the profunda femoral artery.  The distal profunda femoral artery remained disease.  On the left side, the proximal profunda femoral artery was soft, however it became diseased distally.  The anastomosis is to the proximal profunda femoral artery for anastomosis length of about 2 cm.  The inferior mesenteric artery was reimplanted to the distal to the portion of the graft.  A 7 mm dacryon graft was placed on the superior mesenteric artery and brought down in an H. type configuration to the tube a portion of the aortobifemoral bypass graft, proximal to the inferior mesenteric artery.  Both the SMA and IMA were reimplanted at about 1:00 position on the tube portion of the graft  Indications:  The patient suffers from severe almost debilitating claudication.  He complains of significant pain in his right hip which may not be related to his vascular disease.  He also complained about having trouble eating.  Therefore I proceeded with his aortobifemoral bypass graft, I repeated his arteriogram.  This showed an occluded superior mesenteric artery,  therefore I discussed with the family proceeding with mesenteric bypass.  Procedure:  The patient was identified in the holding area and taken to [facility] [address]  The patient was then placed supine on the table. general anesthesia was administered.  The patient was prepped and draped in the usual sterile fashion.  A time out was called and antibiotics were administered.  Longitudinal incisions were made in each groin.  The common femoral arteries were exposed in usual fashion from the anal ligament down to the bifurcation.  I dissected out approximately 4 cm of the profunda femoral artery down to several distal branches as the profunda femoral arteries were severely diseased.  The common femoral and profunda femoral and superficial femoral artery on the right were nearly circumferentially calcified.  On the left the proximal profunda femoral artery was soft but became calcified distally..  I ligated the crossing iliac vein under the inguinal ligament and then placed moist gauze in the groins and turned my attention towards the abdomen.  A midline incision was made from the xiphoid to below the umbilicus.  Cautery was used to expose the fascia which was opened in the midline.  Once I sharply entered the peritoneal cavity, I opened the remaining portion of the fascia and peritoneal lining.  Despite having previous abdominal surgery he did not have any abdominal wall adhesions.  The abdomen was then inspected, there were no gross pathologies other than some mild adhesions.  A Balfour was placed followed by an omni-track retractor.  The transverse colon was reflected cephalad and the small bowel mobilized to the  patient's right.  The ligament of Treitz was taken down sharply.  With the aid of the retractors, the aorta was exposed.  I dissected out the aorta up to the renal arteries.  The patient did have an accessory left renal artery which I preserved.  The inferior mesenteric artery was large in caliber measuring  approximately 4-5 mm.  Once the aorta was adequately exposed I created a tunnel from the abdomen to the groin.  The tunnel was directly anterior to the artery and posterior to the ureters bilaterally.  An umbilical tape was passed.  Next, I dissected out the superior mesenteric artery which I identified in the base of the mesentery.  Just before its major branch it did become soft.  There was no Doppler signal proximally but there was a Doppler signal prior to the  branch.  At this point, the patient was fully heparinized.  A peripheral DeBakeys was used to occlude the aorta distal to the inferior mesenteric artery which was occluded with a Serafin clamp.  A Harken clamp was used to occlude the aorta.  I then opened the aorta with a #11 blade and extended the aortotomy longitudinally with curved Mayo scissors.  I then transected the aorta approximately 1 cm below the left accessory renal artery.  I then performed an endarterectomy of the distal abdominal aorta so that I could use a 3-0 Prolene to oversew the distal aorta.  This was done in 2 layers.  At this point I was able to remove the clamp on the distal aorta.  I selected a 14 x 7 I indicated dacryon graft.  I set up for the proximal anastomosis.  This was done in a and to end fashion using 3 3-0 Prolene back wall for some mattress sutures incorporating a felt strip.  The lateral 2 sutures were then used to continuously create the anastomosis, incorporating a felt strip.  Once the anastomosis was completed the proximal clamp was released.  The anastomosis was hemostatic.  Each limb was then brought through the respective tunnels into the groin.  Dr. [name] perform the left femoral anastomosis and I did the right.  On the right the artery was occluded with vascular clamps.  There was a relatively soft area just in the distal lateral common femoral artery.  A #11 blade was used to open this area and then I used Potts scissors to open the profunda femoral  artery.  This was heavily calcified for approximately 2-1/2 cm.  I opened the profunda until I got to the end of the calcification and found a adequate lumen.  I did a l endarterectomy of the profunda femoral and distal common femoral artery.  The graft was then cut to the appropriate length and spatulated to fit the size of the arteriotomy.  A running anastomosis was then performed using 5-0 Prolene.  Prior to completion the appropriate flushing maneuvers were performed and the anastomosis was completed.  Next on the left side, the profunda femoral artery was opened at its origin and extended distally with Potts scissors.  The artery was of very poor tissue quality.  The graft was cut to the appropriate length and a end to side anastomosis was performed using 5-0 Prolene.  After upper flushing was performed the anastomosis was completed.  Doppler was used to evaluate the signals in bilateral profunda femoral arteries this had excellent graft dependent signals.    The groins were then packed with dry gauze and attention was turned back towards  the abdomen.  I elected to do the reimplantation of the inferior mesenteric artery first.  The inferior mesenteric artery was marked with an 8 pen to maintain proper orientation.  It was then ligated at its origin.  I felt that it lay the best on the distal portion of the tube part of the graft a site was selected.  A Cooley J. clamp was then placed on the aortic graft.  A #11 blade was used to make an arteriotomy.  A #5 punch was then used.  The intramesenteric artery was then spatulated.  A hand to side anastomosis was then created with 6-0 Prolene.  Once this was done clamps were released and there was excellent flow into the inferior mesenteric artery.  Attention was then turned towards the superior mesenteric artery.  It was occluded proximally and distally.  A #11 blade was used to make an arteriotomy which was extended longitudinally with Potts scissors.  I selected  a 7 mm dacryon graft.  A end-to-side anastomosis was created with running 6-0 Prolene.  Once this was completed I found the appropriate spot on the graft the anastomosis.  I cut the graft to the appropriate length, making sure that the mesentery was under no tension.  This was a very short graft, placed in an H-type configuration.  Again a Cooley J. clamp was placed on the tube portion of the aortobifemoral graft.  11 blade was used to make an arteriotomy, followed by a #5 punch.  A end to  side anastomosis was then performed with 6-0 Prolene.  Once the anastomosis was completed it was found to be hemostatic.  There was excellent Doppler signal within the inferior mesenteric artery and superior mesenteric artery.  At this point, the heparin was reversed with 50 mg of protamine.  The patient remained rather oozy and therefore the temperature of the room was increased and we tried to warm the patient.  Once I was satisfied with hemostasis in the groins, the femoral sheaths were reapproximated with 2-0 Vicryl.  This T. she was then closed in multiple layers of 3-0 Vicryl, followed by 4 Vicryl the skin.  I then reinspected the abdomen.  I felt that it was hemostatic.  The retroperitoneum was reapproximated with running 2-0 Vicryl.  The small bowel was then placed back into the anatomic position.  A major that there were no defects in the small bowel.  The fascia was then closed with 2 running #1 PDS suture.  The subcutaneous tissue was closed with 2-0 Vicryl.  Skin was closed with 4-0 Vicryl.  Dermabond placed on all incisions.  There were no immediate postoperative complications.  Disposition:  To PACU in stable condition.   Theotis Burrow, M.D. [facility] Office: [phone] Pager:  [phone]

## 2013-11-22 NOTE — Anesthesia Postprocedure Evaluation (Signed)
Anesthesia Post Note  Patient: Perry Kennedy  Procedure(s) Performed: Procedure(s) (LRB): AORTA BIFEMORAL BYPASS GRAFT; SUPERIOR MESENTERIC ARTERY BYPASS, INFERIOR MESENTERIC BYPASS (N/A)  Anesthesia type: General  Patient location: PACU  Post pain: Pain level controlled  Post assessment: Post-op Vital signs reviewed  Last Vitals: BP 107/69  Pulse 125  Temp(Src) 36.9 C (Oral)  Resp 10  Ht 5\' 6"  (1.676 m)  Wt 142 lb (64.411 kg)  BMI 22.93 kg/m2  SpO2 95%  Post vital signs: Reviewed  Level of consciousness: sedated  Complications: No apparent anesthesia complications

## 2013-11-22 NOTE — Anesthesia Procedure Notes (Signed)
Procedure Name: Intubation Date/Time: 11/22/2013 11:36 AM Performed by: Erik Obey Pre-anesthesia Checklist: Patient identified, Patient being monitored, Timeout performed, Emergency Drugs available and Suction available Patient Re-evaluated:Patient Re-evaluated prior to inductionOxygen Delivery Method: Circle system utilized Preoxygenation: Pre-oxygenation with 100% oxygen Intubation Type: IV induction Ventilation: Mask ventilation without difficulty and Oral airway inserted - appropriate to patient size Laryngoscope Size: Mac and 3 Grade View: Grade I Tube type: Subglottic suction tube Tube size: 7.5 mm Number of attempts: 1 Airway Equipment and Method: Stylet Placement Confirmation: ETT inserted through vocal cords under direct vision,  positive ETCO2 and breath sounds checked- equal and bilateral Secured at: 21 cm Tube secured with: Tape Dental Injury: Teeth and Oropharynx as per pre-operative assessment

## 2013-11-23 ENCOUNTER — Inpatient Hospital Stay (HOSPITAL_COMMUNITY): Payer: Medicare Other

## 2013-11-23 ENCOUNTER — Encounter (HOSPITAL_COMMUNITY): Payer: Self-pay | Admitting: Internal Medicine

## 2013-11-23 DIAGNOSIS — E875 Hyperkalemia: Secondary | ICD-10-CM | POA: Diagnosis present

## 2013-11-23 DIAGNOSIS — F17200 Nicotine dependence, unspecified, uncomplicated: Secondary | ICD-10-CM

## 2013-11-23 DIAGNOSIS — E119 Type 2 diabetes mellitus without complications: Secondary | ICD-10-CM

## 2013-11-23 DIAGNOSIS — R471 Dysarthria and anarthria: Secondary | ICD-10-CM

## 2013-11-23 DIAGNOSIS — R34 Anuria and oliguria: Secondary | ICD-10-CM | POA: Diagnosis not present

## 2013-11-23 DIAGNOSIS — E8770 Fluid overload, unspecified: Secondary | ICD-10-CM | POA: Diagnosis not present

## 2013-11-23 DIAGNOSIS — E1159 Type 2 diabetes mellitus with other circulatory complications: Secondary | ICD-10-CM | POA: Diagnosis present

## 2013-11-23 DIAGNOSIS — R29810 Facial weakness: Secondary | ICD-10-CM

## 2013-11-23 DIAGNOSIS — I771 Stricture of artery: Secondary | ICD-10-CM

## 2013-11-23 LAB — COMPREHENSIVE METABOLIC PANEL
ALT: 29 U/L (ref 0–53)
AST: 18 U/L (ref 0–37)
Albumin: 2.8 g/dL — ABNORMAL LOW (ref 3.5–5.2)
Alkaline Phosphatase: 42 U/L (ref 39–117)
BUN: 15 mg/dL (ref 6–23)
CHLORIDE: 104 meq/L (ref 96–112)
CO2: 19 mEq/L (ref 19–32)
CREATININE: 1.06 mg/dL (ref 0.50–1.35)
Calcium: 7.8 mg/dL — ABNORMAL LOW (ref 8.4–10.5)
GFR calc Af Amer: 80 mL/min — ABNORMAL LOW (ref >90)
GFR, EST NON AFRICAN AMERICAN: 69 mL/min — AB (ref >90)
Glucose, Bld: 216 mg/dL — ABNORMAL HIGH (ref 70–99)
Potassium: 5.4 mEq/L — ABNORMAL HIGH (ref 3.7–5.3)
Sodium: 138 mEq/L (ref 137–147)
Total Bilirubin: 0.4 mg/dL (ref 0.3–1.2)
Total Protein: 5.1 g/dL — ABNORMAL LOW (ref 6.0–8.3)

## 2013-11-23 LAB — GLUCOSE, CAPILLARY
GLUCOSE-CAPILLARY: 106 mg/dL — AB (ref 70–99)
GLUCOSE-CAPILLARY: 112 mg/dL — AB (ref 70–99)
GLUCOSE-CAPILLARY: 116 mg/dL — AB (ref 70–99)
GLUCOSE-CAPILLARY: 119 mg/dL — AB (ref 70–99)
GLUCOSE-CAPILLARY: 138 mg/dL — AB (ref 70–99)
GLUCOSE-CAPILLARY: 153 mg/dL — AB (ref 70–99)
GLUCOSE-CAPILLARY: 192 mg/dL — AB (ref 70–99)
GLUCOSE-CAPILLARY: 211 mg/dL — AB (ref 70–99)
GLUCOSE-CAPILLARY: 222 mg/dL — AB (ref 70–99)
Glucose-Capillary: 234 mg/dL — ABNORMAL HIGH (ref 70–99)
Glucose-Capillary: 158 mg/dL — ABNORMAL HIGH (ref 70–99)
Glucose-Capillary: 173 mg/dL — ABNORMAL HIGH (ref 70–99)
Glucose-Capillary: 131 mg/dL — ABNORMAL HIGH (ref 70–99)
Glucose-Capillary: 77 mg/dL (ref 70–99)
Glucose-Capillary: 114 mg/dL — ABNORMAL HIGH (ref 70–99)
Glucose-Capillary: 76 mg/dL (ref 70–99)
Glucose-Capillary: 93 mg/dL (ref 70–99)
Glucose-Capillary: 121 mg/dL — ABNORMAL HIGH (ref 70–99)
Glucose-Capillary: 120 mg/dL — ABNORMAL HIGH (ref 70–99)
Glucose-Capillary: 113 mg/dL — ABNORMAL HIGH (ref 70–99)
Glucose-Capillary: 122 mg/dL — ABNORMAL HIGH (ref 70–99)

## 2013-11-23 LAB — BASIC METABOLIC PANEL
BUN: 18 mg/dL (ref 6–23)
CALCIUM: 7.9 mg/dL — AB (ref 8.4–10.5)
CO2: 20 meq/L (ref 19–32)
Chloride: 108 mEq/L (ref 96–112)
Creatinine, Ser: 1.13 mg/dL (ref 0.50–1.35)
GFR calc Af Amer: 74 mL/min — ABNORMAL LOW (ref >90)
GFR calc non Af Amer: 64 mL/min — ABNORMAL LOW (ref >90)
GLUCOSE: 116 mg/dL — AB (ref 70–99)
Potassium: 4.9 mEq/L (ref 3.7–5.3)
Sodium: 140 mEq/L (ref 137–147)

## 2013-11-23 LAB — CBC
HCT: 32.6 % — ABNORMAL LOW (ref 39.0–52.0)
HEMATOCRIT: 30.2 % — AB (ref 39.0–52.0)
HEMOGLOBIN: 10.6 g/dL — AB (ref 13.0–17.0)
Hemoglobin: 11.7 g/dL — ABNORMAL LOW (ref 13.0–17.0)
MCH: 30.2 pg (ref 26.0–34.0)
MCH: 30.1 pg (ref 26.0–34.0)
MCHC: 35.9 g/dL (ref 30.0–36.0)
MCHC: 35.1 g/dL (ref 30.0–36.0)
MCV: 84 fL (ref 78.0–100.0)
MCV: 85.8 fL (ref 78.0–100.0)
Platelets: 188 10*3/uL (ref 150–400)
Platelets: 169 10*3/uL (ref 150–400)
RBC: 3.88 MIL/uL — AB (ref 4.22–5.81)
RBC: 3.52 MIL/uL — AB (ref 4.22–5.81)
RDW: 14.9 % (ref 11.5–15.5)
RDW: 15.2 % (ref 11.5–15.5)
WBC: 14.4 10*3/uL — AB (ref 4.0–10.5)
WBC: 16.5 10*3/uL — ABNORMAL HIGH (ref 4.0–10.5)

## 2013-11-23 LAB — HEMOGLOBIN A1C
HEMOGLOBIN A1C: 6.9 % — AB (ref <5.7)
Mean Plasma Glucose: 151 mg/dL — ABNORMAL HIGH (ref <117)

## 2013-11-23 LAB — PHOSPHORUS: Phosphorus: 4.8 mg/dL — ABNORMAL HIGH (ref 2.3–4.6)

## 2013-11-23 LAB — AMYLASE: AMYLASE: 65 U/L (ref 0–105)

## 2013-11-23 LAB — MAGNESIUM: Magnesium: 2.7 mg/dL — ABNORMAL HIGH (ref 1.5–2.5)

## 2013-11-23 LAB — PRO B NATRIURETIC PEPTIDE: Pro B Natriuretic peptide (BNP): 237.3 pg/mL — ABNORMAL HIGH (ref 0–125)

## 2013-11-23 MED ORDER — SODIUM CHLORIDE 0.9 % IV SOLN
INTRAVENOUS | Status: DC
  Administered 2013-11-23: 2.2 [IU]/h via INTRAVENOUS
  Administered 2013-11-23: 1.8 [IU]/h via INTRAVENOUS
  Administered 2013-11-23: 1.5 [IU]/h via INTRAVENOUS
  Filled 2013-11-23: qty 1

## 2013-11-23 MED ORDER — FUROSEMIDE 10 MG/ML IJ SOLN
INTRAMUSCULAR | Status: AC
  Administered 2013-11-23: 20 mg
  Filled 2013-11-23: qty 2

## 2013-11-23 MED ORDER — ALBUTEROL SULFATE (2.5 MG/3ML) 0.083% IN NEBU
2.5000 mg | INHALATION_SOLUTION | RESPIRATORY_TRACT | Status: DC | PRN
  Administered 2013-11-23: 2.5 mg via RESPIRATORY_TRACT
  Filled 2013-11-23 (×2): qty 3

## 2013-11-23 MED ORDER — FUROSEMIDE 10 MG/ML IJ SOLN
20.0000 mg | Freq: Once | INTRAMUSCULAR | Status: AC
  Administered 2013-11-23: 20 mg via INTRAVENOUS
  Filled 2013-11-23: qty 2

## 2013-11-23 MED ORDER — MAGNESIUM SULFATE 40 MG/ML IJ SOLN
2.0000 g | Freq: Once | INTRAMUSCULAR | Status: AC
  Administered 2013-11-23: 2 g via INTRAVENOUS
  Filled 2013-11-23: qty 50

## 2013-11-23 MED ORDER — SODIUM CHLORIDE 0.9 % IV SOLN
1.0000 g | Freq: Once | INTRAVENOUS | Status: AC
  Administered 2013-11-23: 1 g via INTRAVENOUS
  Filled 2013-11-23: qty 10

## 2013-11-23 MED ORDER — ASPIRIN 300 MG RE SUPP
300.0000 mg | Freq: Every day | RECTAL | Status: DC
  Administered 2013-11-23 – 2013-11-27 (×5): 300 mg via RECTAL
  Filled 2013-11-23 (×6): qty 1

## 2013-11-23 MED ORDER — HYDROMORPHONE 0.3 MG/ML IV SOLN
INTRAVENOUS | Status: AC
  Filled 2013-11-23: qty 25

## 2013-11-23 MED ORDER — NALOXONE HCL 0.4 MG/ML IJ SOLN
0.4000 mg | INTRAMUSCULAR | Status: DC | PRN
  Administered 2013-11-23: 0.4 mg via INTRAVENOUS

## 2013-11-23 MED ORDER — FUROSEMIDE 10 MG/ML IJ SOLN
20.0000 mg | Freq: Once | INTRAMUSCULAR | Status: AC
  Administered 2013-11-23: 20 mg via INTRAVENOUS

## 2013-11-23 MED ORDER — ALBUTEROL SULFATE HFA 108 (90 BASE) MCG/ACT IN AERS
2.0000 | INHALATION_SPRAY | RESPIRATORY_TRACT | Status: DC | PRN
  Filled 2013-11-23: qty 6.7

## 2013-11-23 MED ORDER — NALOXONE HCL 0.4 MG/ML IJ SOLN
INTRAMUSCULAR | Status: AC
  Filled 2013-11-23: qty 1

## 2013-11-23 NOTE — Progress Notes (Signed)
Patient ID: Perry Kennedy, male   DOB: Jan 04, 1941, 73 y.o.   MRN: [id number] Vascular Surgery Progress Note  Subjective: One day post aortobifemoral bypass grafting, aorto SMA bypass grafting, reimplantation of inferior mesenteric artery for severe lower extremity occlusive disease and chronic mesenteric ischemia. Patient denies severe abdominal discomfort but does have nausea without vomiting. Nasogastric tube in place. States feet feel okay with no pain or numbness.  Objective:  Filed Vitals:   11/23/13 0800  BP:   Pulse:   Temp:   Resp: 19    Gen. chronically ill-appearing male in no apparent distress alert and oriented x3 with nasogastric tube and nasal oxygen in place Lungs no rhonchi or wheezing HEENT-sclera l Abdomen appropriate with no distention Both lower extremities with audible flow and posterior tibial arteries-pink and well perfused   Labs:  Recent Labs Lab 11/19/13 1401 11/22/13 1853 11/23/13 0410  CREATININE 0.61 0.70 1.06    Recent Labs Lab 11/19/13 1401  11/22/13 1719 11/22/13 1853 11/23/13 0410  NA 128*  < > 138 137 138  K 5.0  < > 4.9 5.3 5.4*  CL 92*  --   --  105 104  CO2 20  --   --  19 19  BUN 10  --   --  12 15  CREATININE 0.61  --   --  0.70 1.06  GLUCOSE 177*  < > 218* 246* 216*  CALCIUM 9.3  --   --  7.4* 7.8*  < > = values in this interval not displayed.  Recent Labs Lab 11/22/13 1608  11/22/13 1719 11/22/13 1853 11/23/13 0410  WBC 15.6*  --   --  17.4* 14.4*  HGB 10.2*  < > 10.5* 13.3 11.7*  HCT 27.4*  < > 31.0* 37.1* 32.6*  PLT 230  --   --  200 188  < > = values in this interval not displayed.  Recent Labs Lab 11/19/13 1401 11/22/13 1608 11/22/13 1853  INR 0.85 1.39 1.48    I/O last 3 completed shifts: In: 6944.4 [I.V.:4554.4; Blood:1200; NG/GT:130; IV Piggyback:1060] Out: 2430 [Urine:830; Emesis/NG output:200; Blood:1400]  Imaging: Dg Chest Portable 1 View  11/23/2013   CLINICAL DATA:  Postop.  EXAM: PORTABLE  CHEST - 1 VIEW  COMPARISON:  11/22/2014  FINDINGS: Right jugular central line and nasogastric tube are still present. Nasogastric tube is in the stomach region. Patchy densities at lung bases are most compatible with atelectasis. Lucency near the right hemidiaphragm could represent free air and consistent with recent intra-abdominal surgery. No evidence for a pneumothorax. Heart size is within normal limits.  IMPRESSION: Patchy basilar densities are most compatible with atelectasis.  Evidence for free intraperitoneal air due to recent intra-abdominal surgery.   Electronically Signed   By: Markus Daft M.D.   On: 11/23/2013 08:30   Dg Chest Portable 1 View  11/22/2013   CLINICAL DATA:  Central line placement.  EXAM: PORTABLE CHEST - 1 VIEW  COMPARISON:  11/19/2013.  FINDINGS: There is a right IJ central venous Cordis in the right internal jugular vein near the junction with the subclavian vein. No pneumothorax. The NG tube is in the stomach. There are significant chronic lung changes with probable emphysema and pulmonary scarring/ fibrosis. Stable eventration of the right hemidiaphragm. Low lung volumes with vascular crowding and atelectasis.  IMPRESSION: The right IJ Cordis is in the region of the internal jugulars subclavian junction.  The NG tube is in the stomach.  Chronic lung changes with superimposed  atelectasis due to low lung volumes.   Electronically Signed   By: Kalman Jewels M.D.   On: 11/22/2013 21:11   Dg Abd Portable 1v  11/22/2013   CLINICAL DATA:  Status post abdominal aortic surgery. Right lower quadrant abdominal pain and nausea.  EXAM: PORTABLE ABDOMEN - 1 VIEW  COMPARISON:  CT scan 10/03/2013.  FINDINGS: The NG tube tip is in the body region of the stomach. The bowel gas pattern is unremarkable. No findings for obstruction an or perforation. The soft tissue shadows are grossly maintained despite motion artifact. Severe aortic iliac calcifications.  IMPRESSION: No plain film findings for an  acute abdominal process.   Electronically Signed   By: Kalman Jewels M.D.   On: 11/22/2013 21:13    Assessment/Plan:  POD #1  LOS: 1 day  s/p Procedure(s): AORTA BIFEMORAL BYPASS GRAFT; SUPERIOR MESENTERIC ARTERY BYPASS, INFERIOR MESENTERIC BYPASS  #1 renal-urine output down with blood pressure in the 1610 systolic range. Creatinine 0.6. Urine appeared concentrated-will give fluid bolus-hematocrit 33% #2 GI-NG 200 cc overnight-abdomen not severely distended some nausea-NG irrigated and functioning well. Will keep n.p.o. and nasogastric tube in place for now #3 vascular adequate flow to both lower extremities with severe bilateral superficial femoral occlusive disease bypass and and profunda femoris arteries bilaterally and flow in the feet-posterior tibials #4 hematology-hematocrit adequate with 33% and platelets 188,000. No evidence of active bleeding  Generally patient doing well on insulin drip for hyperglycemia. We'll keep in SICU in work on pulmonary toilet and give fluid bolus. May need diuresis later today-generally doing well   Tinnie Gens, MD 11/23/2013 8:47 AM

## 2013-11-23 NOTE — Consult Note (Addendum)
PULMONARY  / CRITICAL CARE MEDICINE CONSULTATION  Name: Perry Kennedy MRN: [id number] DOB: February 06, 1941    ADMISSION DATE:  11/22/2013  CHIEF COMPLAINT:  S/p aortobifemoral bypass grafting, consult for medical management  BRIEF PATIENT DESCRIPTION: 73 M with COPD (PFTs not available), active smoking, DM, PVD, DVT (unclear if actual history, not mention in any notes I read dating back to 2010) s/p extensive aorta-bifemoral bypass graft. PCCM consulted for medical management.  SIGNIFICANT EVENTS / STUDIES:  1. K 5.3  LINES / TUBES: 1. PIV 2. A-Line  CULTURES: 1. None  ANTIBIOTICS: 1. Cipro 1/2-  HISTORY OF PRESENT ILLNESS:  Perry Kennedy is a 73 M with severe PVD s/p aortobifemoral bypass. PCCM is consulted for medical management. Unfortunately, Perry Kennedy did not want to engage during the interview and wanted to be left alone. He was unable to provide his medical history or his medications. The following is obtained from chart review.   PAST MEDICAL HISTORY :  Past Medical History  Diagnosis Date  . Hyperlipidemia   . GERD (gastroesophageal reflux disease)   . Hypertension   . COPD (chronic obstructive pulmonary disease)   . Insomnia   . Gout   . DVT (deep venous thrombosis)   . Peripheral vascular disease   . Diabetes mellitus without complication   . Headache(784.0)     migranes- not in a few years  . Coronary artery disease     Past Surgical History  Procedure Laterality Date  . Hemicolectomy      Prior to Admission medications   Medication Sig Start Date End Date Taking? Authorizing Provider  aspirin 325 MG tablet Take 325 mg by mouth daily.   Yes Historical Provider, MD  atorvastatin (LIPITOR) 40 MG tablet Take 1 tablet (40 mg total) by mouth daily. 07/23/13  Yes Lysbeth Penner, FNP  cilostazol (PLETAL) 100 MG tablet Take 1 tablet (100 mg total) by mouth 2 (two) times daily. 07/23/13  Yes Lysbeth Penner, FNP  ciprofloxacin (CIPRO) 500 MG tablet Take 500 mg by  mouth 2 (two) times daily.   Yes Historical Provider, MD  diclofenac (VOLTAREN) 75 MG EC tablet Take 1 tablet (75 mg total) by mouth 2 (two) times daily. 07/23/13  Yes Lysbeth Penner, FNP  lisinopril (PRINIVIL,ZESTRIL) 20 MG tablet Take 1 tablet (20 mg total) by mouth daily. 07/23/13  Yes Lysbeth Penner, FNP  metFORMIN (GLUCOPHAGE) 500 MG tablet Take by mouth 2 (two) times daily with a meal.   Yes Historical Provider, MD  metroNIDAZOLE (FLAGYL) 500 MG tablet Take 500 mg by mouth 2 (two) times daily.   Yes Historical Provider, MD  omeprazole (PRILOSEC) 20 MG capsule Take 1 capsule (20 mg total) by mouth daily. 07/30/13  Yes Lysbeth Penner, FNP  oxyCODONE-acetaminophen (PERCOCET) 5-325 MG per tablet Take by mouth every 4 (four) hours as needed for severe pain.   Yes Historical Provider, MD  ranitidine (ZANTAC) 150 MG tablet Take 1 tablet (150 mg total) by mouth 2 (two) times daily. 07/30/13  Yes Lysbeth Penner, FNP    Allergies  Allergen Reactions  . Morphine And Related Other (See Comments)    "goes crazy"    FAMILY HISTORY:  Family History  Problem Relation Age of Onset  . Heart disease Father 80    pacemaker  . Diabetes Mother     renal failure  . Heart disease Mother   . CAD Brother     died at 78  . COPD  Brother   . Heart disease Brother   . Hyperlipidemia Daughter   . Diabetes Son   . Heart disease Son     before age 55  . Heart attack Son     SOCIAL HISTORY:  reports that he has been smoking Cigarettes.  He has a 82.5 pack-year smoking history. He does not have any smokeless tobacco history on file. He reports that he does not drink alcohol or use illicit drugs.  REVIEW OF SYSTEMS:  Unable to obtain secondary to patient condition.  PHYSICAL EXAM  VITAL SIGNS: Temp:  [98.1 F (36.7 C)-98.5 F (36.9 C)] 98.5 F (36.9 C) (01/02 2130) Pulse Rate:  [90-132] 119 (01/03 0015) Resp:  [7-24] 10 (01/03 0015) BP: (91-191)/(63-96) 103/71 mmHg (01/03 0015) SpO2:  [95 %-99 %]  96 % (01/03 0015) Arterial Line BP: (106-188)/(58-79) 118/67 mmHg (01/03 0015) Weight:  [142 lb (64.411 kg)] 142 lb (64.411 kg) (01/02 1339)  HEMODYNAMICS:    VENTILATOR SETTINGS:    INTAKE / OUTPUT: Intake/Output     01/02 0701 - 01/03 0700   I.V. (mL/kg) 3800 (59)   Blood 1200   NG/GT 70   IV Piggyback 700   Total Intake(mL/kg) 5770 (89.6)   Urine (mL/kg/hr) 585   Blood 1400   Total Output 1985   Net +3785         PHYSICAL EXAMINATION: General:  Elderly M in NAD Neuro:  Intact HEENT:  Sclera anicteric, conjunctiva pink, MMM, OP Clear Neck:  Trachea supple and midline, (-) LAN or JVD Cardiovascular:  RRR, NS1/S2, (-) MRG Lungs:  CTAB Abdomen:  Not examined 2/2 patient preference Musculoskeletal:  (-) C/C/E Skin:  Intact  LABS:  CBC Recent Labs     11/22/13  1608  11/22/13  1629  11/22/13  1719  11/22/13  1853  WBC  15.6*   --    --   17.4*  HGB  10.2*  8.5*  10.5*  13.3  HCT  27.4*  25.0*  31.0*  37.1*  PLT  230   --    --   200    Coag's Recent Labs     11/22/13  1608  11/22/13  1853  APTT  >200*  28  INR  1.39  1.48    BMET Recent Labs     11/22/13  1532  11/22/13  1629  11/22/13  1719  11/22/13  1853  NA  135*  137  138  137  K  4.6  4.9  4.9  5.3  CL   --    --    --   105  CO2   --    --    --   19  BUN   --    --    --   12  CREATININE   --    --    --   0.70  GLUCOSE  211*   --   218*  246*    Electrolytes Recent Labs     11/22/13  1853  CALCIUM  7.4*  MG  1.1*    Sepsis Markers No results found for this basename: LACTICACIDVEN, PROCALCITON, O2SATVEN,  in the last 72 hours  ABG Recent Labs     11/22/13  1629  11/22/13  1853  PHART  7.365  7.278*  PCO2ART  41.0  44.0  PO2ART  152.0*  78.7*    Liver Enzymes No results found for this basename: AST, ALT, ALKPHOS, BILITOT,  ALBUMIN,  in the last 72 hours  Cardiac Enzymes No results found for this basename: TROPONINI, PROBNP,  in the last 72 hours  Glucose Recent  Labs     11/22/13  0816  11/22/13  1022  11/22/13  1840  11/22/13  2126  11/22/13  2337  GLUCAP  186*  166*  177*  234*  222*    Imaging Dg Chest Portable 1 View  11/22/2013   CLINICAL DATA:  Central line placement.  EXAM: PORTABLE CHEST - 1 VIEW  COMPARISON:  11/19/2013.  FINDINGS: There is a right IJ central venous Cordis in the right internal jugular vein near the junction with the subclavian vein. No pneumothorax. The NG tube is in the stomach. There are significant chronic lung changes with probable emphysema and pulmonary scarring/ fibrosis. Stable eventration of the right hemidiaphragm. Low lung volumes with vascular crowding and atelectasis.  IMPRESSION: The right IJ Cordis is in the region of the internal jugulars subclavian junction.  The NG tube is in the stomach.  Chronic lung changes with superimposed atelectasis due to low lung volumes.   Electronically Signed   By: Kalman Jewels M.D.   On: 11/22/2013 21:11   Dg Abd Portable 1v  11/22/2013   CLINICAL DATA:  Status post abdominal aortic surgery. Right lower quadrant abdominal pain and nausea.  EXAM: PORTABLE ABDOMEN - 1 VIEW  COMPARISON:  CT scan 10/03/2013.  FINDINGS: The NG tube tip is in the body region of the stomach. The bowel gas pattern is unremarkable. No findings for obstruction an or perforation. The soft tissue shadows are grossly maintained despite motion artifact. Severe aortic iliac calcifications.  IMPRESSION: No plain film findings for an acute abdominal process.   Electronically Signed   By: Kalman Jewels M.D.   On: 11/22/2013 21:13   EKG: Not obtained yet CXR: Chest X-ray from this evening was personally reviewed by me. There is empyshema with increased interstitial marking which may represent edema.   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 Volume Overload/Pulmonary Edema: This is most likely 2/2 fluids and blood given during the operation. There is no significant respiratory compromise at this point, thus gentle diuresis is appropriate.  Trial of lasix 20 mg  Check BNP  2. COPD History: The details are unknown. Perry Kennedy does not appear to be on any maintenance medications.  Supplemental O2 to maintain O2 sats between 88-92%  PRN albuterol  3. Decreased urine output: Per RN no urine output x 3 hrs.  Flush foley  Trial of lasix  Repeat BMP at 5 Am  4. DM:  Hyperglycemia protocol level 1  Hold home metformin  5. Hyperkalemia: Minimal  Check EKG  Calcium  Follow-up 5 am BMP following lasix  6. Hypomagnesemia:  replete  I have personally obtained a history, examined the patient, evaluated laboratory and imaging results, formulated the assessment and plan and placed orders.  Margarette Asal, MD [facility] Pager: [phone]  11/23/2013, 1:00 AM

## 2013-11-23 NOTE — Progress Notes (Signed)
Patient with O2 sats 89% with much encouragement for using IS after trying to sit on side of bed with PT.  Dr. Oliver Pila notified of increased wheezing with exertion and O2 sats 89%.  Order received for Albuterol Nebs.

## 2013-11-23 NOTE — Progress Notes (Signed)
MD lawson at bedside for AM rounds- made aware of pt's low UOP (~10cc/hr) and the absent doralis pedis pulse on the R. New orders received. Will monitor  Lorre Munroe

## 2013-11-23 NOTE — Consult Note (Signed)
Referring Physician: Dr. Oliver Pila    Chief Complaint: Code stroke: right face weakness and dysarthria.  HPI:                                                                                                                                         Perry Kennedy is an 73 y.o. 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 He was reportedly last known well at 530 pm today when nursing staff noticed that his speech was garbled, had right face weakness and was less responsive. He has been on a morphine pump since surgery and has been drowsy during the day but able to speak fluently. Urgent CT brain revealed no acute abnormality. Initial NIHSS   Date last known well:11/23/13  Time last known well: 530 pm tPA Given: no, S/P surgery 11/22/13 NIHSS: 5    Past Medical History  Diagnosis Date  . Hyperlipidemia   . GERD (gastroesophageal reflux disease)   . Hypertension   . COPD (chronic obstructive pulmonary disease)   . Insomnia   . Gout   . DVT (deep venous thrombosis)   . Peripheral vascular disease   . Diabetes mellitus without complication   . Headache(784.0)     migranes- not in a few years  . Coronary artery disease     Past Surgical History  Procedure Laterality Date  . Hemicolectomy      Family History  Problem Relation Age of Onset  . Heart disease Father 40    pacemaker  . Diabetes Mother     renal failure  . Heart disease Mother   . CAD Brother     died at 42  . COPD Brother   . Heart disease Brother   . Hyperlipidemia Daughter   . Diabetes Son   . Heart disease Son     before age 67  . Heart attack Son    Social History:  reports that he has been smoking Cigarettes.  He has a 82.5 pack-year smoking history. He does not have any smokeless tobacco history on file. He reports that he does not drink alcohol or  use illicit drugs.  Allergies:  Allergies  Allergen Reactions  . Morphine And Related Other (See Comments)    "goes crazy"    Medications:  I have reviewed the patient's current medications.  ROS:  Unable to obtain                                                                                                                                     History obtained from chart review   Physical exam: pleasant male in no apparent distress.Blood pressure 184/85, pulse 114, temperature 98.2 F (36.8 C), temperature source Oral, resp. rate 27, height 5' 6"  (1.676 m), weight 66 kg (145 lb 8.1 oz), SpO2 96.00%.  Head: normocephalic. Neck: supple, no bruits, no JVD. Cardiac: no murmurs. Lungs: clear. Abdomen: soft, no tender, no mass. Extremities: no edema.  Neurologic Examination:                                                                                                      Mental Status: Alert, oriented, thought content appropriate. Mild dysarthria without evidence of aphasia.  Able to follow 3 step commands without difficulty. Cranial Nerves: II: Discs flat bilaterally; Visual fields grossly normal, pupils equal, round, reactive to light and accommodation III,IV, VI: ptosis not present, extra-ocular motions intact bilaterally V,VII: smile asymmetric with right face weakness, facial light touch sensation normal bilaterally VIII: hearing normal bilaterally IX,X: gag reflex present XI: bilateral shoulder shrug XII: midline tongue extension without atrophy or fasciculations  Motor: Right : Upper extremity   5/5    Left:     Upper extremity   5/5  Lower extremity   5/5     Lower extremity   5/5 Tone and bulk:normal tone throughout; no atrophy noted Sensory: Pinprick and light touch intact throughout, bilaterally Deep Tendon Reflexes:  Right: Upper Extremity    Left: Upper extremity   biceps (C-5 to C-6) 2/4   biceps (C-5 to C-6) 2/4 tricep (C7) 2/4    triceps (C7) 2/4 Brachioradialis (C6) 2/4  Brachioradialis (C6) 2/4  Lower Extremity Lower Extremity  quadriceps (L-2 to L-4) 2/4   quadriceps (L-2 to L-4) 2/4 Achilles (S1) 2/4   Achilles (S1) 2/4  Plantars: Right: downgoing   Left: downgoing Cerebellar: normal finger-to-nose,  normal heel-to-shin test Gait:  Unable to test    Results for orders placed during the hospital encounter of 11/22/13 (from the past 48 hour(s))  POCT I-STAT 4, (NA,K, GLUC, HGB,HCT)     Status: Abnormal   Collection Time    11/22/13  8:03 AM      Result Value Range   Sodium 135 (*) 137 - 147 mEq/L  Potassium 4.6  3.7 - 5.3 mEq/L   Glucose, Bld 184 (*) 70 - 99 mg/dL   HCT 41.0  39.0 - 52.0 %   Hemoglobin 13.9  13.0 - 17.0 g/dL  GLUCOSE, CAPILLARY     Status: Abnormal   Collection Time    11/22/13  8:16 AM      Result Value Range   Glucose-Capillary 186 (*) 70 - 99 mg/dL  GLUCOSE, CAPILLARY     Status: Abnormal   Collection Time    11/22/13 10:22 AM      Result Value Range   Glucose-Capillary 166 (*) 70 - 99 mg/dL  PREPARE RBC (CROSSMATCH)     Status: None   Collection Time    11/22/13  1:13 PM      Result Value Range   Order Confirmation ORDER PROCESSED BY BLOOD BANK    POCT I-STAT 4, (NA,K, GLUC, HGB,HCT)     Status: Abnormal   Collection Time    11/22/13  1:20 PM      Result Value Range   Sodium 135 (*) 137 - 147 mEq/L   Potassium 4.4  3.7 - 5.3 mEq/L   Glucose, Bld 159 (*) 70 - 99 mg/dL   HCT 31.0 (*) 39.0 - 52.0 %   Hemoglobin 10.5 (*) 13.0 - 17.0 g/dL  POCT I-STAT 4, (NA,K, GLUC, HGB,HCT)     Status: Abnormal   Collection Time    11/22/13  3:32 PM      Result Value Range   Sodium 135 (*) 137 - 147 mEq/L   Potassium 4.6  3.7 - 5.3 mEq/L   Glucose, Bld 211 (*) 70 - 99 mg/dL   HCT 28.0 (*) 39.0 - 52.0 %   Hemoglobin 9.5 (*) 13.0 - 17.0 g/dL  APTT     Status: Abnormal   Collection  Time    11/22/13  4:08 PM      Result Value Range   aPTT >200 (*) 24 - 37 seconds   Comment:            IF BASELINE aPTT IS ELEVATED,     SUGGEST PATIENT RISK ASSESSMENT     BE USED TO DETERMINE APPROPRIATE     ANTICOAGULANT THERAPY.     REPEATED TO VERIFY     CRITICAL RESULT CALLED TO, READ BACK BY AND VERIFIED WITH:     DR [name] 1734 11/22/2013     CALLED BY WBOND  PROTIME-INR     Status: Abnormal   Collection Time    11/22/13  4:08 PM      Result Value Range   Prothrombin Time 16.7 (*) 11.6 - 15.2 seconds   INR 1.39  0.00 - 1.49  CBC     Status: Abnormal   Collection Time    11/22/13  4:08 PM      Result Value Range   WBC 15.6 (*) 4.0 - 10.5 K/uL   RBC 3.33 (*) 4.22 - 5.81 MIL/uL   Hemoglobin 10.2 (*) 13.0 - 17.0 g/dL   Comment: REPEATED TO VERIFY     DELTA CHECK NOTED     SPECIMEN CHECKED FOR CLOTS   HCT 27.4 (*) 39.0 - 52.0 %   MCV 82.3  78.0 - 100.0 fL   MCH 30.5  26.0 - 34.0 pg   MCHC 37.2 (*) 30.0 - 36.0 g/dL   Comment: RULED OUT INTERFERING SUBSTANCES   RDW 14.3  11.5 - 15.5 %   Platelets 230  150 - 400 K/uL  POCT I-STAT 7, (LYTES, BLD GAS, ICA,H+H)     Status: Abnormal   Collection Time    11/22/13  4:29 PM      Result Value Range   pH, Arterial 7.365  7.350 - 7.450   pCO2 arterial 41.0  35.0 - 45.0 mmHg   pO2, Arterial 152.0 (*) 80.0 - 100.0 mmHg   Bicarbonate 23.8  20.0 - 24.0 mEq/L   TCO2 25  0 - 100 mmol/L   O2 Saturation 99.0     Acid-base deficit 2.0  0.0 - 2.0 mmol/L   Sodium 137  137 - 147 mEq/L   Potassium 4.9  3.7 - 5.3 mEq/L   Calcium, Ion 1.13  1.13 - 1.30 mmol/L   HCT 25.0 (*) 39.0 - 52.0 %   Hemoglobin 8.5 (*) 13.0 - 17.0 g/dL   Patient temperature 35.4 C     Sample type ARTERIAL    POCT I-STAT 4, (NA,K, GLUC, HGB,HCT)     Status: Abnormal   Collection Time    11/22/13  5:19 PM      Result Value Range   Sodium 138  137 - 147 mEq/L   Potassium 4.9  3.7 - 5.3 mEq/L   Glucose, Bld 218 (*) 70 - 99 mg/dL   HCT 31.0 (*) 39.0 - 52.0 %    Hemoglobin 10.5 (*) 13.0 - 17.0 g/dL  GLUCOSE, CAPILLARY     Status: Abnormal   Collection Time    11/22/13  6:40 PM      Result Value Range   Glucose-Capillary 177 (*) 70 - 99 mg/dL  CBC     Status: Abnormal   Collection Time    11/22/13  6:53 PM      Result Value Range   WBC 17.4 (*) 4.0 - 10.5 K/uL   RBC 4.42  4.22 - 5.81 MIL/uL   Hemoglobin 13.3  13.0 - 17.0 g/dL   Comment: REPEATED TO VERIFY   HCT 37.1 (*) 39.0 - 52.0 %   MCV 83.9  78.0 - 100.0 fL   MCH 30.1  26.0 - 34.0 pg   MCHC 35.8  30.0 - 36.0 g/dL   RDW 14.6  11.5 - 15.5 %   Platelets 200  150 - 400 K/uL  BASIC METABOLIC PANEL     Status: Abnormal   Collection Time    11/22/13  6:53 PM      Result Value Range   Sodium 137  137 - 147 mEq/L   Comment: Please note change in reference range.   Potassium 5.3  3.7 - 5.3 mEq/L   Comment: Please note change in reference range.   Chloride 105  96 - 112 mEq/L   CO2 19  19 - 32 mEq/L   Glucose, Bld 246 (*) 70 - 99 mg/dL   BUN 12  6 - 23 mg/dL   Creatinine, Ser 0.70  0.50 - 1.35 mg/dL   Calcium 7.4 (*) 8.4 - 10.5 mg/dL   GFR calc non Af Amer >90  >90 mL/min   GFR calc Af Amer >90  >90 mL/min   Comment: (NOTE)     The eGFR has been calculated using the CKD EPI equation.     This calculation has not been validated in all clinical situations.     eGFR's persistently <90 mL/min signify possible Chronic Kidney     Disease.  APTT     Status: None   Collection Time    11/22/13  6:53 PM  Result Value Range   aPTT 28  24 - 37 seconds  BLOOD GAS, ARTERIAL     Status: Abnormal   Collection Time    11/22/13  6:53 PM      Result Value Range   FIO2 21.00     Delivery systems VENTILATOR     Mode ROOM AIR     pH, Arterial 7.278 (*) 7.350 - 7.450   pCO2 arterial 44.0  35.0 - 45.0 mmHg   pO2, Arterial 78.7 (*) 80.0 - 100.0 mmHg   Bicarbonate 19.9 (*) 20.0 - 24.0 mEq/L   TCO2 21.2  0 - 100 mmol/L   Acid-base deficit 5.7 (*) 0.0 - 2.0 mmol/L   O2 Saturation 94.6     Patient  temperature 98.6     Collection site RIGHT RADIAL     Drawn by COLLECTED BY NURSE     Sample type ARTERIAL     Allens test (pass/fail) PASS  PASS  HEMOGLOBIN A1C     Status: Abnormal   Collection Time    11/22/13  6:53 PM      Result Value Range   Hemoglobin A1C 6.9 (*) <5.7 %   Comment: (NOTE)                                                                               According to the ADA Clinical Practice Recommendations for 2011, when     HbA1c is used as a screening test:      >=6.5%   Diagnostic of Diabetes Mellitus               (if abnormal result is confirmed)     5.7-6.4%   Increased risk of developing Diabetes Mellitus     References:Diagnosis and Classification of Diabetes Mellitus,Diabetes     PPJK,9326,71(IWPYK 1):S62-S69 and Standards of Medical Care in             Diabetes - 2011,Diabetes Care,2011,34 (Suppl 1):S11-S61.   Mean Plasma Glucose 151 (*) <117 mg/dL   Comment: Performed at [facility]     Status: Abnormal   Collection Time    11/22/13  6:53 PM      Result Value Range   Prothrombin Time 17.5 (*) 11.6 - 15.2 seconds   INR 1.48  0.00 - 1.49  MAGNESIUM     Status: Abnormal   Collection Time    11/22/13  6:53 PM      Result Value Range   Magnesium 1.1 (*) 1.5 - 2.5 mg/dL  GLUCOSE, CAPILLARY     Status: Abnormal   Collection Time    11/22/13  9:26 PM      Result Value Range   Glucose-Capillary 234 (*) 70 - 99 mg/dL   Comment 1 Documented in Chart     Comment 2 Notify RN    GLUCOSE, CAPILLARY     Status: Abnormal   Collection Time    11/22/13 11:37 PM      Result Value Range   Glucose-Capillary 222 (*) 70 - 99 mg/dL   Comment 1 Documented in Chart     Comment 2 Notify RN    GLUCOSE, CAPILLARY  Status: Abnormal   Collection Time    11/23/13  3:51 AM      Result Value Range   Glucose-Capillary 211 (*) 70 - 99 mg/dL   Comment 1 Documented in Chart     Comment 2 Notify RN    CBC     Status: Abnormal   Collection Time     11/23/13  4:10 AM      Result Value Range   WBC 14.4 (*) 4.0 - 10.5 K/uL   RBC 3.88 (*) 4.22 - 5.81 MIL/uL   Hemoglobin 11.7 (*) 13.0 - 17.0 g/dL   HCT 32.6 (*) 39.0 - 52.0 %   MCV 84.0  78.0 - 100.0 fL   MCH 30.2  26.0 - 34.0 pg   MCHC 35.9  30.0 - 36.0 g/dL   RDW 14.9  11.5 - 15.5 %   Platelets 188  150 - 400 K/uL  COMPREHENSIVE METABOLIC PANEL     Status: Abnormal   Collection Time    11/23/13  4:10 AM      Result Value Range   Sodium 138  137 - 147 mEq/L   Comment: Please note change in reference range.   Potassium 5.4 (*) 3.7 - 5.3 mEq/L   Comment: Please note change in reference range.   Chloride 104  96 - 112 mEq/L   CO2 19  19 - 32 mEq/L   Glucose, Bld 216 (*) 70 - 99 mg/dL   BUN 15  6 - 23 mg/dL   Creatinine, Ser 1.06  0.50 - 1.35 mg/dL   Comment: DELTA CHECK NOTED   Calcium 7.8 (*) 8.4 - 10.5 mg/dL   Total Protein 5.1 (*) 6.0 - 8.3 g/dL   Albumin 2.8 (*) 3.5 - 5.2 g/dL   AST 18  0 - 37 U/L   ALT 29  0 - 53 U/L   Alkaline Phosphatase 42  39 - 117 U/L   Total Bilirubin 0.4  0.3 - 1.2 mg/dL   GFR calc non Af Amer 69 (*) >90 mL/min   GFR calc Af Amer 80 (*) >90 mL/min   Comment: (NOTE)     The eGFR has been calculated using the CKD EPI equation.     This calculation has not been validated in all clinical situations.     eGFR's persistently <90 mL/min signify possible Chronic Kidney     Disease.  MAGNESIUM     Status: Abnormal   Collection Time    11/23/13  4:10 AM      Result Value Range   Magnesium 2.7 (*) 1.5 - 2.5 mg/dL  AMYLASE     Status: None   Collection Time    11/23/13  4:10 AM      Result Value Range   Amylase 65  0 - 105 U/L  PHOSPHORUS     Status: Abnormal   Collection Time    11/23/13  4:10 AM      Result Value Range   Phosphorus 4.8 (*) 2.3 - 4.6 mg/dL  PRO B NATRIURETIC PEPTIDE     Status: Abnormal   Collection Time    11/23/13  4:10 AM      Result Value Range   Pro B Natriuretic peptide (BNP) 237.3 (*) 0 - 125 pg/mL  GLUCOSE, CAPILLARY      Status: Abnormal   Collection Time    11/23/13  5:27 AM      Result Value Range   Glucose-Capillary 192 (*) 70 - 99 mg/dL  GLUCOSE, CAPILLARY     Status: Abnormal   Collection Time    11/23/13  6:35 AM      Result Value Range   Glucose-Capillary 173 (*) 70 - 99 mg/dL  GLUCOSE, CAPILLARY     Status: Abnormal   Collection Time    11/23/13  7:27 AM      Result Value Range   Glucose-Capillary 158 (*) 70 - 99 mg/dL  GLUCOSE, CAPILLARY     Status: Abnormal   Collection Time    11/23/13  8:46 AM      Result Value Range   Glucose-Capillary 131 (*) 70 - 99 mg/dL  GLUCOSE, CAPILLARY     Status: None   Collection Time    11/23/13  9:46 AM      Result Value Range   Glucose-Capillary 77  70 - 99 mg/dL  GLUCOSE, CAPILLARY     Status: Abnormal   Collection Time    11/23/13 10:53 AM      Result Value Range   Glucose-Capillary 114 (*) 70 - 99 mg/dL  GLUCOSE, CAPILLARY     Status: Abnormal   Collection Time    11/23/13 12:19 PM      Result Value Range   Glucose-Capillary 153 (*) 70 - 99 mg/dL  GLUCOSE, CAPILLARY     Status: None   Collection Time    11/23/13  1:24 PM      Result Value Range   Glucose-Capillary 76  70 - 99 mg/dL  GLUCOSE, CAPILLARY     Status: Abnormal   Collection Time    11/23/13  1:25 PM      Result Value Range   Glucose-Capillary 116 (*) 70 - 99 mg/dL  GLUCOSE, CAPILLARY     Status: Abnormal   Collection Time    11/23/13  2:30 PM      Result Value Range   Glucose-Capillary 106 (*) 70 - 99 mg/dL   Comment 1 Documented in Chart    BASIC METABOLIC PANEL     Status: Abnormal   Collection Time    11/23/13  3:45 PM      Result Value Range   Sodium 140  137 - 147 mEq/L   Comment: Please note change in reference range.   Potassium 4.9  3.7 - 5.3 mEq/L   Comment: Please note change in reference range.   Chloride 108  96 - 112 mEq/L   CO2 20  19 - 32 mEq/L   Glucose, Bld 116 (*) 70 - 99 mg/dL   BUN 18  6 - 23 mg/dL   Creatinine, Ser 1.13  0.50 - 1.35 mg/dL    Calcium 7.9 (*) 8.4 - 10.5 mg/dL   GFR calc non Af Amer 64 (*) >90 mL/min   GFR calc Af Amer 74 (*) >90 mL/min   Comment: (NOTE)     The eGFR has been calculated using the CKD EPI equation.     This calculation has not been validated in all clinical situations.     eGFR's persistently <90 mL/min signify possible Chronic Kidney     Disease.   Dg Chest Portable 1 View  11/23/2013   CLINICAL DATA:  Postop.  EXAM: PORTABLE CHEST - 1 VIEW  COMPARISON:  11/22/2014  FINDINGS: Right jugular central line and nasogastric tube are still present. Nasogastric tube is in the stomach region. Patchy densities at lung bases are most compatible with atelectasis. Lucency near the right hemidiaphragm could represent free air and consistent with recent intra-abdominal  surgery. No evidence for a pneumothorax. Heart size is within normal limits.  IMPRESSION: Patchy basilar densities are most compatible with atelectasis.  Evidence for free intraperitoneal air due to recent intra-abdominal surgery.   Electronically Signed   By: Markus Daft M.D.   On: 11/23/2013 08:30   Dg Chest Portable 1 View  11/22/2013   CLINICAL DATA:  Central line placement.  EXAM: PORTABLE CHEST - 1 VIEW  COMPARISON:  11/19/2013.  FINDINGS: There is a right IJ central venous Cordis in the right internal jugular vein near the junction with the subclavian vein. No pneumothorax. The NG tube is in the stomach. There are significant chronic lung changes with probable emphysema and pulmonary scarring/ fibrosis. Stable eventration of the right hemidiaphragm. Low lung volumes with vascular crowding and atelectasis.  IMPRESSION: The right IJ Cordis is in the region of the internal jugulars subclavian junction.  The NG tube is in the stomach.  Chronic lung changes with superimposed atelectasis due to low lung volumes.   Electronically Signed   By: Kalman Jewels M.D.   On: 11/22/2013 21:11   Dg Abd Portable 1v  11/22/2013   CLINICAL DATA:  Status post abdominal  aortic surgery. Right lower quadrant abdominal pain and nausea.  EXAM: PORTABLE ABDOMEN - 1 VIEW  COMPARISON:  CT scan 10/03/2013.  FINDINGS: The NG tube tip is in the body region of the stomach. The bowel gas pattern is unremarkable. No findings for obstruction an or perforation. The soft tissue shadows are grossly maintained despite motion artifact. Severe aortic iliac calcifications.  IMPRESSION: No plain film findings for an acute abdominal process.   Electronically Signed   By: Kalman Jewels M.D.   On: 11/22/2013 21:13     Assessment: 73 y.o. male with acute onset dysarthria and right face weakness. NIHSS 5. CT brain revealed no acute abnormality. Suspect left brain stroke, most likely not involving a major intracranial artery as he has a low NIHSS. No a candidate for thrombolysis due to major surgery yesterday. Recommend: complete stroke work up. Rectal aspirin. Stroke team to resume care in the morning.  Stroke Risk Factors - age, HTN, hyperlipidemia, DM, CAD  Plan: 1. HgbA1c, fasting lipid panel 2. MRI, MRA  of the brain without contrast 3. Echocardiogram 4. Carotid dopplers 5. Prophylactic therapy-aspirin rectally 6. Risk factor modification 7. Telemetry monitoring 8. Frequent neuro checks 9. PT/OT SLP   Dorian Pod, MD [facility] [phone]  11/23/2013, 6:26 PM

## 2013-11-23 NOTE — Progress Notes (Signed)
[facility] Progress Note Patient Name: LAAKEA PEREIRA DOB: Apr 04, 1941 MRN: [id number]  Date of Service  11/23/2013   HPI/Events of Note   RN reports new slurred speech and facial droop  eICU Interventions   Instructed to call "Code Stroke"   Intervention Category Intermediate Interventions: Communication with other healthcare providers and/or family  Doree Fudge 11/23/2013, 6:00 PM

## 2013-11-23 NOTE — Progress Notes (Signed)
Patient to side of bed and then into chair with assist.  Patient now noted to have Rt. Facial droop and slurred speech.  Rapid Response nurse notified and came to room to assess patient as well.  Dr. Oliver Pila notified as well.  To CT for STAT CT Head and Code Stroke called.

## 2013-11-23 NOTE — Evaluation (Signed)
Physical Therapy Evaluation Patient Details Name: Perry Kennedy MRN: [id number] DOB: 1941/04/20 Today's Date: 11/23/2013 Time: 1520-1540 PT Time Calculation (min): 20 min  PT Assessment / Plan / Recommendation History of Present Illness  s/p extensive aorta-bifemoral bypass graft.  Currently has NG tube placed.  Clinical Impression  Patient presents s/p extensive aorto-bifemoral bypass graft with increased pain, decreased cardiopulmonary status and decrease mobility.  Patient will benefit from PT for progressive mobility and increased independence with mobility for ultimate discharge home.  Depending on progress, patient may benefit from Inpatient Rehab to reach maximum potential for return home.      PT Assessment  Patient needs continued PT services    Follow Up Recommendations  CIR    Does the patient have the potential to tolerate intense rehabilitation      Barriers to Discharge Decreased caregiver support      Equipment Recommendations  Rolling walker with 5" wheels;3in1 (PT)    Recommendations for Other Services Rehab consult   Frequency Min 3X/week    Precautions / Restrictions Precautions Precautions: Fall Restrictions Weight Bearing Restrictions: No   Pertinent Vitals/Pain Patient with increased pain during mobility, unable to rate.  Used PCA throughout treatment.      Mobility  Bed Mobility Bed Mobility: Supine to Sit;Sit to Supine Supine to Sit: 2: Max assist Sit to Supine: 1: +2 Total assist Sit to Supine: Patient Percentage: 50% Details for Bed Mobility Assistance: required assist lifting shoulders off bed in supine to sit and required assistance with legs for sit to supine Transfers Transfers: Sit to Stand;Stand to Sit Sit to Stand: 1: +2 Total assist Sit to Stand: Patient Percentage: 60% Stand to Sit: 1: +2 Total assist Stand to Sit: Patient Percentage: 70% Details for Transfer Assistance: +2 assist for lines/tubes and patient with posterior  lean Ambulation/Gait Ambulation/Gait Assistance: 1: +2 Total assist Ambulation/Gait: Patient Percentage: 40% Ambulation Distance (Feet): 2 Feet Assistive device: Rolling walker Ambulation/Gait Assistance Details: patient with posterior lean in standing, requiring assistance to maintain upright; required max encouragement  to ambulate.  o2 sats decreased in standing and patient returned to bed. Gait Pattern: Step-to pattern;Decreased stride length    Exercises     PT Diagnosis: Generalized weakness  PT Problem List: Decreased activity tolerance;Decreased balance;Decreased mobility;Decreased knowledge of use of DME;Cardiopulmonary status limiting activity PT Treatment Interventions: DME instruction;Gait training;Stair training;Functional mobility training;Therapeutic activities;Therapeutic exercise;Balance training;Patient/family education     PT Goals(Current goals can be found in the care plan section) Acute Rehab PT Goals Patient Stated Goal: "just rest" PT Goal Formulation: With patient Time For Goal Achievement: 12/07/13 Potential to Achieve Goals: Good  Visit Information  Last PT Received On: 11/23/13 Assistance Needed: +2 History of Present Illness: s/p extensive aorta-bifemoral bypass graft.  Currently has NG tube placed.       Prior [place] expects to be discharged to:: Private residence Living Arrangements: Alone Prior Function Level of Independence: Independent Communication Communication: No difficulties    Cognition  Cognition Arousal/Alertness: Lethargic Behavior During Therapy: Anxious Overall Cognitive Status: Within Functional Limits for tasks assessed    Extremity/Trunk Assessment Upper Extremity Assessment Upper Extremity Assessment: Generalized weakness Lower Extremity Assessment Lower Extremity Assessment: RLE deficits/detail;LLE deficits/detail RLE: Unable to fully assess due to pain LLE: Unable to fully assess due  to pain   Balance Balance Balance Assessed: No  End of Session PT - End of Session Equipment Utilized During Treatment: Gait belt Activity Tolerance: Patient limited by pain;Treatment limited secondary  to medical complications (Comment) Patient left: in bed;with call bell/phone within reach;with nursing/sitter in room Nurse Communication: Mobility status;Other (comment) (decreased o2 sats)   GP     Shanna Cisco, [place] 11/23/2013, 4:41 PM

## 2013-11-23 NOTE — Progress Notes (Signed)
PULMONARY  / CRITICAL CARE MEDICINE CONSULTATION  Name: GARDY MONTANARI MRN: [id number] DOB: 1941-08-01    ADMISSION DATE:  11/22/2013  CHIEF COMPLAINT:  S/p aortobifemoral bypass grafting, consult for medical management  BRIEF PATIENT DESCRIPTION: 34 M with COPD (PFTs not available), active smoking, DM, PVD, DVT (unclear if actual history, not mention in any notes I read dating back to 2010) s/p extensive aorta-bifemoral bypass graft. PCCM consulted for medical management.  SIGNIFICANT EVENTS / STUDIES:  1/2 s/p aorto-bifem, SMA, IMA bypasses (Dr Trula Slade)  LINES / TUBES: 1. PIV 2. A-Line  CULTURES: 1. None  ANTIBIOTICS: 1. Cipro 1/2 >>  2. Flagyl 1/2 >>   Subjective/ Interval events: Extubated post-op Up to chair Remains NPO Minimal UOP after lasix trial last pm  PHYSICAL EXAM  VITAL SIGNS: Temp:  [98.1 F (36.7 C)-99.1 F (37.3 C)] 98.5 F (36.9 C) (01/03 0725) Pulse Rate:  [102-132] 111 (01/03 0900) Resp:  [7-24] 12 (01/03 0900) BP: (86-149)/(53-96) 96/66 mmHg (01/03 0900) SpO2:  [90 %-99 %] 92 % (01/03 0900) Arterial Line BP: (86-188)/(42-79) 97/46 mmHg (01/03 0900) Weight:  [64.411 kg (142 lb)-66 kg (145 lb 8.1 oz)] 66 kg (145 lb 8.1 oz) (01/03 0545)  HEMODYNAMICS:    VENTILATOR SETTINGS:    INTAKE / OUTPUT: Intake/Output     01/02 0701 - 01/03 0700 01/03 0701 - 01/04 0700   I.V. (mL/kg) 4554.4 (69) 256.7 (3.9)   Blood 1200    NG/GT 130 30   IV Piggyback 1060    Total Intake(mL/kg) 6944.4 (105.2) 286.7 (4.3)   Urine (mL/kg/hr) 830 20 (0.1)   Emesis/NG output 200    Blood 1400    Total Output 2430 20   Net +4514.4 +266.7          PHYSICAL EXAMINATION: General:  Elderly M in NAD Neuro:  Intact HEENT:  Sclera anicteric, conjunctiva pink, MMM, OP Clear Neck:  Trachea supple and midline, (-) LAN or JVD Cardiovascular:  RRR, NS1/S2, (-) MRG Lungs:  CTAB Abdomen:  benign Musculoskeletal:  (-) C/C/E Skin:  Intact  LABS:  CBC Recent Labs   11/22/13  1608   11/22/13  1719  11/22/13  1853  11/23/13  0410  WBC  15.6*   --    --   17.4*  14.4*  HGB  10.2*   < >  10.5*  13.3  11.7*  HCT  27.4*   < >  31.0*  37.1*  32.6*  PLT  230   --    --   200  188   < > = values in this interval not displayed.    Coag's Recent Labs     11/22/13  1608  11/22/13  1853  APTT  >200*  28  INR  1.39  1.48    BMET Recent Labs     11/22/13  1719  11/22/13  1853  11/23/13  0410  NA  138  137  138  K  4.9  5.3  5.4*  CL   --   105  104  CO2   --   19  19  BUN   --   12  15  CREATININE   --   0.70  1.06  GLUCOSE  218*  246*  216*    Electrolytes Recent Labs     11/22/13  1853  11/23/13  0410  CALCIUM  7.4*  7.8*  MG  1.1*  2.7*  PHOS   --  4.8*    Sepsis Markers No results found for this basename: LACTICACIDVEN, PROCALCITON, O2SATVEN,  in the last 72 hours  ABG Recent Labs     11/22/13  1629  11/22/13  1853  PHART  7.365  7.278*  PCO2ART  41.0  44.0  PO2ART  152.0*  78.7*    Liver Enzymes Recent Labs     11/23/13  0410  AST  18  ALT  29  ALKPHOS  42  BILITOT  0.4  ALBUMIN  2.8*    Cardiac Enzymes Recent Labs     11/23/13  0410  PROBNP  237.3*    Glucose Recent Labs     11/22/13  2337  11/23/13  0351  11/23/13  0527  11/23/13  0635  11/23/13  0727  11/23/13  0846  GLUCAP  222*  211*  192*  173*  158*  131*    Imaging Dg Chest Portable 1 View  11/23/2013   CLINICAL DATA:  Postop.  EXAM: PORTABLE CHEST - 1 VIEW  COMPARISON:  11/22/2014  FINDINGS: Right jugular central line and nasogastric tube are still present. Nasogastric tube is in the stomach region. Patchy densities at lung bases are most compatible with atelectasis. Lucency near the right hemidiaphragm could represent free air and consistent with recent intra-abdominal surgery. No evidence for a pneumothorax. Heart size is within normal limits.  IMPRESSION: Patchy basilar densities are most compatible with atelectasis.  Evidence for free  intraperitoneal air due to recent intra-abdominal surgery.   Electronically Signed   By: Markus Daft M.D.   On: 11/23/2013 08:30   Dg Chest Portable 1 View  11/22/2013   CLINICAL DATA:  Central line placement.  EXAM: PORTABLE CHEST - 1 VIEW  COMPARISON:  11/19/2013.  FINDINGS: There is a right IJ central venous Cordis in the right internal jugular vein near the junction with the subclavian vein. No pneumothorax. The NG tube is in the stomach. There are significant chronic lung changes with probable emphysema and pulmonary scarring/ fibrosis. Stable eventration of the right hemidiaphragm. Low lung volumes with vascular crowding and atelectasis.  IMPRESSION: The right IJ Cordis is in the region of the internal jugulars subclavian junction.  The NG tube is in the stomach.  Chronic lung changes with superimposed atelectasis due to low lung volumes.   Electronically Signed   By: Kalman Jewels M.D.   On: 11/22/2013 21:11   Dg Abd Portable 1v  11/22/2013   CLINICAL DATA:  Status post abdominal aortic surgery. Right lower quadrant abdominal pain and nausea.  EXAM: PORTABLE ABDOMEN - 1 VIEW  COMPARISON:  CT scan 10/03/2013.  FINDINGS: The NG tube tip is in the body region of the stomach. The bowel gas pattern is unremarkable. No findings for obstruction an or perforation. The soft tissue shadows are grossly maintained despite motion artifact. Severe aortic iliac calcifications.  IMPRESSION: No plain film findings for an acute abdominal process.   Electronically Signed   By: Kalman Jewels M.D.   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 COPD History: The details are unknown. Mr. Mata does not appear to be on any maintenance medications.  Supplemental O2 to maintain O2  sats between 88-92%  PRN albuterol  2. Decreased urine output, suspect intravascularly depleted. No response to low dose lasix 1/2 pm  IVF bolus  Follow UOP with increased fluids  3. DM:  Hyperglycemia protocol level 1  Hold home metformin  3. Hyperkalemia, remains elevated am 1/3  Recheck pm 1/3 and in am   4. Hypomagnesemia:  repleted  5. Bowel angina, s/p vascular bypasses  On cipro + flagyl, ? Duration post-op  NPO for now as per Dr Stephens Shire plans  NGT in place  Consider TPN if we believe he will require NPO for long  I have personally obtained a history, examined the patient, evaluated laboratory and imaging results, formulated the assessment and plan and placed orders.  Baltazar Apo, MD, PhD 11/23/2013, 9:31 AM [facility] [phone] or if no answer [phone]

## 2013-11-23 NOTE — Progress Notes (Signed)
Dr. [name] notified of increased lethargy, increased swelling in face.  Made aware of O2 sats and increasing O2 to 50% venti mask.  Order received to stop Dilaudid PCA and to give Lasix 20mg  IV.  Will continue to monitor patient.

## 2013-11-23 NOTE — Significant Event (Signed)
Rapid Response Event Note  Overview: Time Called: 8756 Arrival Time: 4332 Event Type: Neurologic  Initial Focused Assessment: Called by primary RN to evaluate new slurred speech and facial droop.  Upon arrival to patients room Rn at bedside.  Patient sitting in chair, lethargic, able to follow simple commands, has a facial droop and slurred garbled speech.  Patient had been receiving PCA dilaudid and was lethargic, gave 0.4 mg narcan.  Patient became more alert after narcan administered, speech still slightly slurred and has facial droop.  Dr. Earnest Conroy notified at [place], instructed to call code Stroke.     Interventions:Patient transported to Ct scan on monitor. NIHSS 5.  Dr Aram Beecham present for Ct scan.    Patient transported back to room, without problems.     Event Summary:   at      at          [facility]

## 2013-11-23 NOTE — Progress Notes (Signed)
Back from CT.  Dr. [name] notified of Facial droop and slurred speech.  Dr. Armida Sans called and spoke with Dr. [name] as well and made aware of findings.  Orders received per Dr. Armida Sans

## 2013-11-24 ENCOUNTER — Inpatient Hospital Stay (HOSPITAL_COMMUNITY): Payer: Medicare Other

## 2013-11-24 DIAGNOSIS — R0989 Other specified symptoms and signs involving the circulatory and respiratory systems: Secondary | ICD-10-CM

## 2013-11-24 DIAGNOSIS — R34 Anuria and oliguria: Secondary | ICD-10-CM

## 2013-11-24 LAB — CBC
HCT: 27.7 % — ABNORMAL LOW (ref 39.0–52.0)
HEMOGLOBIN: 9.7 g/dL — AB (ref 13.0–17.0)
MCH: 29.8 pg (ref 26.0–34.0)
MCHC: 35 g/dL (ref 30.0–36.0)
MCV: 85 fL (ref 78.0–100.0)
Platelets: 165 10*3/uL (ref 150–400)
RBC: 3.26 MIL/uL — AB (ref 4.22–5.81)
RDW: 15.2 % (ref 11.5–15.5)
WBC: 13.2 10*3/uL — ABNORMAL HIGH (ref 4.0–10.5)

## 2013-11-24 LAB — GLUCOSE, CAPILLARY
GLUCOSE-CAPILLARY: 109 mg/dL — AB (ref 70–99)
GLUCOSE-CAPILLARY: 110 mg/dL — AB (ref 70–99)
GLUCOSE-CAPILLARY: 117 mg/dL — AB (ref 70–99)
GLUCOSE-CAPILLARY: 123 mg/dL — AB (ref 70–99)
GLUCOSE-CAPILLARY: 126 mg/dL — AB (ref 70–99)
GLUCOSE-CAPILLARY: 157 mg/dL — AB (ref 70–99)
Glucose-Capillary: 115 mg/dL — ABNORMAL HIGH (ref 70–99)
Glucose-Capillary: 109 mg/dL — ABNORMAL HIGH (ref 70–99)
Glucose-Capillary: 109 mg/dL — ABNORMAL HIGH (ref 70–99)
Glucose-Capillary: 108 mg/dL — ABNORMAL HIGH (ref 70–99)
Glucose-Capillary: 94 mg/dL (ref 70–99)
Glucose-Capillary: 86 mg/dL (ref 70–99)
Glucose-Capillary: 97 mg/dL (ref 70–99)

## 2013-11-24 LAB — HEMOGLOBIN A1C
Hgb A1c MFr Bld: 6.6 % — ABNORMAL HIGH (ref <5.7)
Mean Plasma Glucose: 143 mg/dL — ABNORMAL HIGH (ref <117)

## 2013-11-24 LAB — BASIC METABOLIC PANEL
BUN: 19 mg/dL (ref 6–23)
CALCIUM: 7.8 mg/dL — AB (ref 8.4–10.5)
CO2: 21 mEq/L (ref 19–32)
Chloride: 108 mEq/L (ref 96–112)
Creatinine, Ser: 1 mg/dL (ref 0.50–1.35)
GFR calc Af Amer: 85 mL/min — ABNORMAL LOW (ref >90)
GFR calc non Af Amer: 74 mL/min — ABNORMAL LOW (ref >90)
GLUCOSE: 113 mg/dL — AB (ref 70–99)
POTASSIUM: 4.5 meq/L (ref 3.7–5.3)
SODIUM: 140 meq/L (ref 137–147)

## 2013-11-24 LAB — APTT: aPTT: 32 seconds (ref 24–37)

## 2013-11-24 LAB — LIPID PANEL
CHOL/HDL RATIO: 5 ratio
Cholesterol: 85 mg/dL (ref 0–200)
HDL: 17 mg/dL — AB (ref >39)
LDL CALC: 45 mg/dL (ref 0–99)
Triglycerides: 117 mg/dL (ref <150)
VLDL: 23 mg/dL (ref 0–40)

## 2013-11-24 LAB — PROTIME-INR
INR: 1.2 (ref 0.00–1.49)
Prothrombin Time: 14.9 seconds (ref 11.6–15.2)

## 2013-11-24 LAB — AMYLASE: Amylase: 131 U/L — ABNORMAL HIGH (ref 0–105)

## 2013-11-24 MED ORDER — INSULIN ASPART 100 UNIT/ML ~~LOC~~ SOLN
1.0000 [IU] | SUBCUTANEOUS | Status: DC
  Administered 2013-11-24 – 2013-11-25 (×2): 2 [IU] via SUBCUTANEOUS
  Administered 2013-11-25: 1 [IU] via SUBCUTANEOUS
  Administered 2013-11-25: 2 [IU] via SUBCUTANEOUS
  Administered 2013-11-25: 1 [IU] via SUBCUTANEOUS
  Administered 2013-11-25: 2 [IU] via SUBCUTANEOUS
  Administered 2013-11-26: 1 [IU] via SUBCUTANEOUS
  Administered 2013-11-26: 2 [IU] via SUBCUTANEOUS
  Administered 2013-11-27: 1 [IU] via SUBCUTANEOUS
  Administered 2013-11-28: 2 [IU] via SUBCUTANEOUS
  Administered 2013-11-28 (×3): 1 [IU] via SUBCUTANEOUS
  Administered 2013-11-28: 2 [IU] via SUBCUTANEOUS
  Administered 2013-11-29: 1 [IU] via SUBCUTANEOUS
  Administered 2013-11-29 (×2): 2 [IU] via SUBCUTANEOUS
  Administered 2013-11-29: 1 [IU] via SUBCUTANEOUS
  Administered 2013-11-30: 2 [IU] via SUBCUTANEOUS
  Administered 2013-11-30: 12:00:00 via SUBCUTANEOUS
  Administered 2013-12-01 – 2013-12-02 (×4): 2 [IU] via SUBCUTANEOUS
  Administered 2013-12-02: 1 [IU] via SUBCUTANEOUS
  Administered 2013-12-02 (×2): 2 [IU] via SUBCUTANEOUS
  Administered 2013-12-03: 1 [IU] via SUBCUTANEOUS
  Administered 2013-12-03: 3 [IU] via SUBCUTANEOUS
  Administered 2013-12-03 (×2): 1 [IU] via SUBCUTANEOUS
  Administered 2013-12-03: 3 [IU] via SUBCUTANEOUS
  Administered 2013-12-04 (×3): 1 [IU] via SUBCUTANEOUS

## 2013-11-24 MED ORDER — INSULIN GLARGINE 100 UNIT/ML ~~LOC~~ SOLN
15.0000 [IU] | SUBCUTANEOUS | Status: DC
  Administered 2013-11-24 – 2013-12-04 (×11): 15 [IU] via SUBCUTANEOUS
  Filled 2013-11-24 (×14): qty 0.15

## 2013-11-24 MED ORDER — INFLUENZA VAC SPLIT QUAD 0.5 ML IM SUSP
0.5000 mL | INTRAMUSCULAR | Status: AC | PRN
Start: 2013-11-24 — End: 2013-12-04
  Administered 2013-12-04: 0.5 mL via INTRAMUSCULAR
  Filled 2013-11-24: qty 0.5

## 2013-11-24 NOTE — Progress Notes (Signed)
MD [name] made aware of pt's low UOP and pt's lack of relief from pain medicine. No new orders at this time. Will monitor  Perry Kennedy

## 2013-11-24 NOTE — Progress Notes (Signed)
Bilateral carotid artery duplex:  1-39% ICA stenosis.  Vertebral artery flow is antegrade.     

## 2013-11-24 NOTE — Progress Notes (Signed)
  Echocardiogram 2D Echocardiogram has been performed.  Perry Kennedy 11/24/2013, 5:46 PM

## 2013-11-24 NOTE — Progress Notes (Signed)
Stroke Team Progress Note  HISTORY Perry Kennedy is an 73 y.o. male with a past medical history significant for HTN, hyperlipidemia, DM, CAD, PVD, COPD, gout, s/p xtensive aorto-bifemoral bypass graft on 11/22/13, now with acute onset of the right face weakness and dysarthria.  He was reportedly last known well at 530 pm 1/03 when nursing staff noticed that his speech was garbled, had right face weakness and was less responsive.  He has been on a morphine pump since surgery and has been drowsy during the day but able to speak fluently.  Urgent CT brain revealed no acute abnormality.  Initial NIHSS 5   Patient was not a TPA candidate secondary torecent surgery.  He remained on 2S for further evaluation and treatment.  SUBJECTIVE Resting comfortably, current getting carotid doppler study completed. No acute overnight issues.   OBJECTIVE Most recent Vital Signs: Filed Vitals:   11/24/13 0600 11/24/13 0700 11/24/13 0800 11/24/13 0900  BP: 122/73 133/82 147/64 148/77  Pulse: 100 103 105 102  Temp:   99 F (37.2 C)   TempSrc:   Oral   Resp: 16 18 16 18   Height:      Weight: 143 lb 4.8 oz (65 kg)     SpO2: 97% 96% 96% 94%   CBG (last 3)   Recent Labs  11/24/13 0204 11/24/13 0255 11/24/13 0351  GLUCAP 110* 115* 109*    IV Fluid Intake:   . sodium chloride 125 mL/hr at 11/23/13 1434  . DOPamine    . insulin (NOVOLIN-R) infusion 1.5 Units/hr (11/24/13 0500)  . lactated ringers 20 mL/hr at 11/22/13 0832  . lactated ringers 50 mL/hr at 11/22/13 1119    MEDICATIONS  . aspirin  300 mg Rectal Daily  . ciprofloxacin  400 mg Intravenous Q12H  . docusate sodium  100 mg Oral Daily  . enoxaparin (LOVENOX) injection  30 mg Subcutaneous Q24H  . insulin aspart  1-3 Units Subcutaneous Q4H  . insulin glargine  15 Units Subcutaneous Q24H  . metronidazole  500 mg Intravenous Q6H  . pantoprazole  40 mg Oral Daily   PRN:  acetaminophen, acetaminophen, albuterol, alum & mag  hydroxide-simeth, bisacodyl, guaiFENesin-dextromethorphan, hydrALAZINE, HYDROmorphone (DILAUDID) injection, influenza vac split quadrivalent PF, labetalol, labetalol, magnesium sulfate 1 - 4 g bolus IVPB, metoprolol, naLOXone (NARCAN)  injection, phenol, potassium chloride, senna-docusate  Diet:  NPO  Activity:  OOB DVT Prophylaxis:  SCD  CLINICALLY SIGNIFICANT STUDIES Basic Metabolic Panel:  Recent Labs Lab 11/22/13 1853 11/23/13 0410 11/23/13 1545 11/24/13 0340  NA 137 138 140 140  K 5.3 5.4* 4.9 4.5  CL 105 104 108 108  CO2 19 19 20 21   GLUCOSE 246* 216* 116* 113*  BUN 12 15 18 19   CREATININE 0.70 1.06 1.13 1.00  CALCIUM 7.4* 7.8* 7.9* 7.8*  MG 1.1* 2.7*  --   --   PHOS  --  4.8*  --   --    Liver Function Tests:  Recent Labs Lab 11/19/13 1401 11/23/13 0410  AST 31 18  ALT 55* 29  ALKPHOS 69 42  BILITOT 0.3 0.4  PROT 7.4 5.1*  ALBUMIN 3.9 2.8*   CBC:  Recent Labs Lab 11/23/13 2000 11/24/13 0340  WBC 16.5* 13.2*  HGB 10.6* 9.7*  HCT 30.2* 27.7*  MCV 85.8 85.0  PLT 169 165   Coagulation:  Recent Labs Lab 11/19/13 1401 11/22/13 1608 11/22/13 1853 11/24/13 0802  LABPROT 11.5* 16.7* 17.5* 14.9  INR 0.85 1.39 1.48 1.20  Cardiac Enzymes: No results found for this basename: CKTOTAL, CKMB, CKMBINDEX, TROPONINI,  in the last 168 hours Urinalysis:  Recent Labs Lab 11/19/13 1400  COLORURINE YELLOW  LABSPEC 1.013  PHURINE 6.0  GLUCOSEU 500*  HGBUR NEGATIVE  BILIRUBINUR NEGATIVE  KETONESUR NEGATIVE  PROTEINUR 30*  UROBILINOGEN 0.2  NITRITE NEGATIVE  LEUKOCYTESUR NEGATIVE   Lipid Panel    Component Value Date/Time   CHOL 85 11/24/2013 0340   TRIG 117 11/24/2013 0340   HDL 17* 11/24/2013 0340   CHOLHDL 5.0 11/24/2013 0340   VLDL 23 11/24/2013 0340   LDLCALC 45 11/24/2013 0340   HgbA1C  Lab Results  Component Value Date   HGBA1C 6.6* 11/23/2013    Urine Drug Screen:   No results found for this basename: labopia, cocainscrnur, labbenz, amphetmu, thcu,  labbarb    Alcohol Level: No results found for this basename: ETH,  in the last 168 hours  Ct Head Wo Contrast  11/23/2013   CLINICAL DATA:  Code stroke. Right-sided facial droop in abnormal speech.  EXAM: CT HEAD WITHOUT CONTRAST  TECHNIQUE: Contiguous axial images were obtained from the base of the skull through the vertex without intravenous contrast.  COMPARISON:  None.  FINDINGS: No acute intracranial abnormalities identified. Specifically, no intra or extra-axial hemorrhage, hydrocephalus, mass effect, midline shift, or evidence of acute cortically based infarction. There is a 5 x 4 mm right calcification posteriorly and inferiorly in the right globe, of uncertain etiology or chronicity. Chronic appearing scattered sinus disease is noted. No air-fluid levels. Mastoid air cells are clear.  The skull is intact.  IMPRESSION: 1. No acute intracranial abnormality. This report was called to Dr. Aram Beecham at 6:34 p.m. 11/23/2013. 2. Focal calcification within the posterior inferior right globe, of uncertain etiology.   Electronically Signed   By: Curlene Dolphin M.D.   On: 11/23/2013 18:42   Dg Chest Portable 1 View  11/23/2013   CLINICAL DATA:  Postop.  EXAM: PORTABLE CHEST - 1 VIEW  COMPARISON:  11/22/2014  FINDINGS: Right jugular central line and nasogastric tube are still present. Nasogastric tube is in the stomach region. Patchy densities at lung bases are most compatible with atelectasis. Lucency near the right hemidiaphragm could represent free air and consistent with recent intra-abdominal surgery. No evidence for a pneumothorax. Heart size is within normal limits.  IMPRESSION: Patchy basilar densities are most compatible with atelectasis.  Evidence for free intraperitoneal air due to recent intra-abdominal surgery.   Electronically Signed   By: Markus Daft M.D.   On: 11/23/2013 08:30   Dg Chest Portable 1 View  11/22/2013   CLINICAL DATA:  Central line placement.  EXAM: PORTABLE CHEST - 1 VIEW   COMPARISON:  11/19/2013.  FINDINGS: There is a right IJ central venous Cordis in the right internal jugular vein near the junction with the subclavian vein. No pneumothorax. The NG tube is in the stomach. There are significant chronic lung changes with probable emphysema and pulmonary scarring/ fibrosis. Stable eventration of the right hemidiaphragm. Low lung volumes with vascular crowding and atelectasis.  IMPRESSION: The right IJ Cordis is in the region of the internal jugulars subclavian junction.  The NG tube is in the stomach.  Chronic lung changes with superimposed atelectasis due to low lung volumes.   Electronically Signed   By: Kalman Jewels M.D.   On: 11/22/2013 21:11   Dg Abd Portable 1v  11/22/2013   CLINICAL DATA:  Status post abdominal aortic surgery. Right lower quadrant abdominal pain  and nausea.  EXAM: PORTABLE ABDOMEN - 1 VIEW  COMPARISON:  CT scan 10/03/2013.  FINDINGS: The NG tube tip is in the body region of the stomach. The bowel gas pattern is unremarkable. No findings for obstruction an or perforation. The soft tissue shadows are grossly maintained despite motion artifact. Severe aortic iliac calcifications.  IMPRESSION: No plain film findings for an acute abdominal process.   Electronically Signed   By: Kalman Jewels M.D.   On: 11/22/2013 21:13    CT of the brain   1. No acute intracranial abnormality. This report was called to Dr.  Aram Beecham at 6:34 p.m. 11/23/2013.  2. Focal calcification within the posterior inferior right globe, of  uncertain etiology.   MRI of the brain    MRA of the brain    2D Echocardiogram    Carotid Doppler    CXR    Therapy Recommendations CIR  Physical Exam   Mental Status:  Alert, oriented, thought content appropriate. Mild dysarthria without evidence of aphasia. Able to follow 3 step commands without difficulty.  Cranial Nerves:  II: Pupils equal, round, reactive to light III,IV, VI: ptosis not present, extra-ocular motions intact  bilaterally  V,VII: smile asymmetric with right face weakness, facial light touch sensation normal bilaterally  VIII: hearing normal bilaterally  IX,X: gag reflex present  XI: bilateral shoulder shrug  XII: midline tongue extension without atrophy or fasciculations  Motor:  Right : Upper extremity 5/5 Left: Upper extremity 5/5  Lower extremity 5/5 Lower extremity 5/5  Tone and bulk:normal tone throughout; no atrophy noted  Sensory: Pinprick and light touch intact throughout, bilaterally  Deep Tendon Reflexes:  Right: Upper Extremity Left: Upper extremity  biceps (C-5 to C-6) 2/4 biceps (C-5 to C-6) 2/4  tricep (C7) 2/4 triceps (C7) 2/4  Brachioradialis (C6) 2/4 Brachioradialis (C6) 2/4  Lower Extremity Lower Extremity  quadriceps (L-2 to L-4) 2/4 quadriceps (L-2 to L-4) 2/4  Achilles (S1) 2/4 Achilles (S1) 2/4  Plantars:  Right: downgoing Left: downgoing  Cerebellar:  normal finger-to-nose, normal heel-to-shin test   ASSESSMENT Mr. TAICHI REPKA is a 73 y.o. male presenting with dysarthria and right facial weakness. No TPA given due to recent surgery. Head CT unremarkable, suspect left brain stroke based on symptoms. On ASA 325mg  prior to admission. Now on ASA 300mg  PR for secondary stroke prevention. Work up underway.   Hyptertension  Hyperlipidemia  DVT  DM  CAD  S/p aorto-bifemoral bypass graft on 11/22/2013  Hospital day # 2  TREATMENT/PLAN  Continue ASA 300mg  PR  Awaiting Brain MRI/A  Carotid doppler  2D echo  PT/OT/SLP  Check lipid panel and HgbA1c. On lipitor at home, resume prior to discharge  Jim Like, DO Neurology-Stroke

## 2013-11-24 NOTE — Progress Notes (Signed)
PULMONARY  / CRITICAL CARE MEDICINE CONSULTATION  Name: Perry Kennedy MRN: [id number] DOB: Nov 15, 1941    ADMISSION DATE:  11/22/2013  CHIEF COMPLAINT:  S/p aortobifemoral bypass grafting, consult for medical management  BRIEF PATIENT DESCRIPTION: 60 M with COPD (PFTs not available), active smoking, DM, PVD, DVT (unclear if actual history, not mention in any notes I read dating back to 2010) s/p extensive aorta-bifemoral bypass graft. PCCM consulted for medical management.  SIGNIFICANT EVENTS / STUDIES:  1/2 s/p aorto-bifem, SMA, IMA bypasses (Dr Trula Slade) 1/3 Code stroke at 18:00.  1/4 Brain MRI >>   LINES / TUBES: 1. PIV 2. A-Line  CULTURES: 1. None  ANTIBIOTICS: 1. Cipro 1/2 >>  2. Flagyl 1/2 >>   Subjective/ Interval events: Unfortunately new CVA last pm MRI scheduled for today  PHYSICAL EXAM  VITAL SIGNS: Temp:  [97.2 F (36.2 C)-100.9 F (38.3 C)] 97.2 F (36.2 C) (01/04 1100) Pulse Rate:  [98-114] 110 (01/04 1100) Resp:  [8-27] 18 (01/04 1100) BP: (96-184)/(51-85) 97/62 mmHg (01/04 1100) SpO2:  [80 %-99 %] 99 % (01/04 1100) Arterial Line BP: (99-204)/(49-86) 156/60 mmHg (01/04 1100) FiO2 (%):  [50 %] 50 % (01/04 1000) Weight:  [65 kg (143 lb 4.8 oz)] 65 kg (143 lb 4.8 oz) (01/04 0600)  HEMODYNAMICS:    VENTILATOR SETTINGS: Vent Mode:  [-]  FiO2 (%):  [50 %] 50 %  INTAKE / OUTPUT: Intake/Output     01/03 0701 - 01/04 0700 01/04 0701 - 01/05 0700   P.O. 75    I.V. (mL/kg) 3405.1 (52.4) 379.9 (5.8)   Blood     NG/GT 60    IV Piggyback 400 200   Total Intake(mL/kg) 3940.1 (60.6) 579.9 (8.9)   Urine (mL/kg/hr) 885 (0.6) 155 (0.5)   Emesis/NG output 400 (0.3) 200 (0.6)   Blood     Total Output 1285 355   Net +2655.1 +224.9          PHYSICAL EXAMINATION: General:  Elderly M in NAD Neuro:  Intact HEENT:  Sclera anicteric, facial droop, some dysarthria Neck:  Trachea supple and midline, (-) LAN or JVD Cardiovascular:  RRR, NS1/S2, (-) MRG Lungs:   CTAB Abdomen:  benign Musculoskeletal:  (-) C/C/E Skin:  Intact  LABS:  CBC Recent Labs     11/23/13  0410  11/23/13  2000  11/24/13  0340  WBC  14.4*  16.5*  13.2*  HGB  11.7*  10.6*  9.7*  HCT  32.6*  30.2*  27.7*  PLT  188  169  165    Coag's Recent Labs     11/22/13  1608  11/22/13  1853  11/24/13  0802  APTT  >200*  28  32  INR  1.39  1.48  1.20    BMET Recent Labs     11/23/13  0410  11/23/13  1545  11/24/13  0340  NA  138  140  140  K  5.4*  4.9  4.5  CL  104  108  108  CO2  19  20  21   BUN  15  18  19   CREATININE  1.06  1.13  1.00  GLUCOSE  216*  116*  113*    Electrolytes Recent Labs     11/22/13  1853  11/23/13  0410  11/23/13  1545  11/24/13  0340  CALCIUM  7.4*  7.8*  7.9*  7.8*  MG  1.1*  2.7*   --    --  PHOS   --   4.8*   --    --     Sepsis Markers No results found for this basename: LACTICACIDVEN, PROCALCITON, O2SATVEN,  in the last 72 hours  ABG Recent Labs     11/22/13  1629  11/22/13  1853  PHART  7.365  7.278*  PCO2ART  41.0  44.0  PO2ART  152.0*  78.7*    Liver Enzymes Recent Labs     11/23/13  0410  AST  18  ALT  29  ALKPHOS  42  BILITOT  0.4  ALBUMIN  2.8*    Cardiac Enzymes Recent Labs     11/23/13  0410  PROBNP  237.3*    Glucose Recent Labs     11/23/13  2256  11/24/13  0005  11/24/13  0050  11/24/13  0204  11/24/13  0255  11/24/13  0351  GLUCAP  122*  126*  123*  110*  115*  109*    Imaging Ct Head Wo Contrast  11/23/2013   CLINICAL DATA:  Code stroke. Right-sided facial droop in abnormal speech.  EXAM: CT HEAD WITHOUT CONTRAST  TECHNIQUE: Contiguous axial images were obtained from the base of the skull through the vertex without intravenous contrast.  COMPARISON:  None.  FINDINGS: No acute intracranial abnormalities identified. Specifically, no intra or extra-axial hemorrhage, hydrocephalus, mass effect, midline shift, or evidence of acute cortically based infarction. There is a 5 x 4 mm  right calcification posteriorly and inferiorly in the right globe, of uncertain etiology or chronicity. Chronic appearing scattered sinus disease is noted. No air-fluid levels. Mastoid air cells are clear.  The skull is intact.  IMPRESSION: 1. No acute intracranial abnormality. This report was called to Dr. Aram Beecham at 6:34 p.m. 11/23/2013. 2. Focal calcification within the posterior inferior right globe, of uncertain etiology.   Electronically Signed   By: Curlene Dolphin M.D.   On: 11/23/2013 18:42   Dg Chest Portable 1 View  11/23/2013   CLINICAL DATA:  Postop.  EXAM: PORTABLE CHEST - 1 VIEW  COMPARISON:  11/22/2014  FINDINGS: Right jugular central line and nasogastric tube are still present. Nasogastric tube is in the stomach region. Patchy densities at lung bases are most compatible with atelectasis. Lucency near the right hemidiaphragm could represent free air and consistent with recent intra-abdominal surgery. No evidence for a pneumothorax. Heart size is within normal limits.  IMPRESSION: Patchy basilar densities are most compatible with atelectasis.  Evidence for free intraperitoneal air due to recent intra-abdominal surgery.   Electronically Signed   By: Markus Daft M.D.   On: 11/23/2013 08:30   Dg Chest Portable 1 View  11/22/2013   CLINICAL DATA:  Central line placement.  EXAM: PORTABLE CHEST - 1 VIEW  COMPARISON:  11/19/2013.  FINDINGS: There is a right IJ central venous Cordis in the right internal jugular vein near the junction with the subclavian vein. No pneumothorax. The NG tube is in the stomach. There are significant chronic lung changes with probable emphysema and pulmonary scarring/ fibrosis. Stable eventration of the right hemidiaphragm. Low lung volumes with vascular crowding and atelectasis.  IMPRESSION: The right IJ Cordis is in the region of the internal jugulars subclavian junction.  The NG tube is in the stomach.  Chronic lung changes with superimposed atelectasis due to low lung  volumes.   Electronically Signed   By: Kalman Jewels M.D.   On: 11/22/2013 21:11   Dg Abd Portable 1v  11/22/2013  CLINICAL DATA:  Status post abdominal aortic surgery. Right lower quadrant abdominal pain and nausea.  EXAM: PORTABLE ABDOMEN - 1 VIEW  COMPARISON:  CT scan 10/03/2013.  FINDINGS: The NG tube tip is in the body region of the stomach. The bowel gas pattern is unremarkable. No findings for obstruction an or perforation. The soft tissue shadows are grossly maintained despite motion artifact. Severe aortic iliac calcifications.  IMPRESSION: No plain film findings for an acute abdominal process.   Electronically Signed   By: Kalman Jewels M.D.   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 COPD History: The details are unknown. Mr. Ausborn does not appear to be on any maintenance medications.  Supplemental O2 to maintain O2 sats between 88-92%  PRN albuterol  2. Decreased urine output, suspect intravascularly depleted. No response to low dose lasix 1/2 pm  IVF bolus  Follow UOP with increased fluids  3. DM:  Hyperglycemia protocol level 1  Hold home metformin  3. Hyperkalemia, resolved  Follow BMP  4. Hypomagnesemia:  repleted  5. Bowel angina, s/p vascular bypasses  On cipro + flagyl, ? Duration post-op  NPO for now  NGT in place  6. Acute CVA  ASA as ordered  MRI brain ordered and pending  I have personally obtained a history, examined the patient, evaluated laboratory and imaging results, formulated the assessment and plan and placed orders.  Baltazar Apo, MD, PhD 11/24/2013, 11:55 AM [facility] [phone] or if no answer [phone]

## 2013-11-24 NOTE — Progress Notes (Signed)
PULMONARY  / CRITICAL CARE MEDICINE CONSULTATION  Name: Perry Kennedy MRN: [id number] DOB: 1941-02-26    ADMISSION DATE:  11/22/2013  CHIEF COMPLAINT:  S/p aortobifemoral bypass grafting, consult for medical management  BRIEF PATIENT DESCRIPTION: 73 yo active smoker with COPD, DM, PVD, DVT s/p aorta-bifemoral bypass graft. PCCM consulted for medical management.  SIGNIFICANT EVENTS / STUDIES:  1/2   OR >>> S/p aorto-bifem, SMA, IMA bypasses (Dr. Trula Slade) 1/3   Head CT >>> No acute intracranial abnormality. Focal calcification within the posterior inferior right globe, of uncertain etiology. 1/4   Brain MRI >> Acute right frontal lobe infarct with a small amount of petechial hemorrhage. No evidence of major intracranial arterial occlusion or high-grade stenosis.  LINES / TUBES: Foley 1/2 >>>  CULTURES:  ANTIBIOTICS: Cipro 1/2 >>> 1/5 Flagyl 1/2 >>> 1/5  INTERVAL HISTORY:  No acute events overnight. Speech much improved.  VITAL SIGNS: Temp:  [97.2 F (36.2 C)-99.1 F (37.3 C)] 98.3 F (36.8 C) (01/04 1600) Pulse Rate:  [98-111] 109 (01/04 2000) Resp:  [13-20] 20 (01/04 2000) BP: (97-154)/(51-82) 134/62 mmHg (01/04 2000) SpO2:  [87 %-99 %] 92 % (01/04 2000) Arterial Line BP: (99-187)/(54-86) 154/56 mmHg (01/04 1200) FiO2 (%):  [50 %] 50 % (01/04 1400) Weight:  [65 kg (143 lb 4.8 oz)] 65 kg (143 lb 4.8 oz) (01/04 0600)  HEMODYNAMICS:   VENTILATOR SETTINGS: Vent Mode:  [-]  FiO2 (%):  [50 %] 50 %  INTAKE / OUTPUT: Intake/Output     01/04 0701 - 01/05 0700   P.O.    I.V. (mL/kg) 1129.9 (17.4)   NG/GT 120   IV Piggyback 500   Total Intake(mL/kg) 1749.9 (26.9)   Urine (mL/kg/hr) 580 (0.7)   Emesis/NG output 700 (0.8)   Total Output 1280   Net +469.9         PHYSICAL EXAMINATION: General:  No acute distress Neuro:  Some dysarthria, otherwise intact HEENT:  PERRL, some face asymmetry Neck:  No JVD Cardiovascular:  Regular, no murmurs Lungs:  Bilateral air  entry, no added sounds Abdomen:  Soft, non tender Musculoskeletal:  Moves all extremities, no edema Skin:  Intact  CBC  Recent Labs Lab 11/23/13 0410 11/23/13 2000 11/24/13 0340  WBC 14.4* 16.5* 13.2*  HGB 11.7* 10.6* 9.7*  HCT 32.6* 30.2* 27.7*  PLT 188 169 165   Coag's  Recent Labs Lab 11/22/13 1608 11/22/13 1853 11/24/13 0802  APTT >200* 28 32  INR 1.39 1.48 1.20   BMET  Recent Labs Lab 11/23/13 0410 11/23/13 1545 11/24/13 0340  NA 138 140 140  K 5.4* 4.9 4.5  CL 104 108 108  CO2 19 20 21   BUN 15 18 19   CREATININE 1.06 1.13 1.00  GLUCOSE 216* 116* 113*   Electrolytes  Recent Labs Lab 11/22/13 1853 11/23/13 0410 11/23/13 1545 11/24/13 0340  CALCIUM 7.4* 7.8* 7.9* 7.8*  MG 1.1* 2.7*  --   --   PHOS  --  4.8*  --   --    Sepsis Markers No results found for this basename: LATICACIDVEN, PROCALCITON, O2SATVEN,  in the last 168 hours ABG  Recent Labs Lab 11/19/13 1401 11/22/13 1629 11/22/13 1853  PHART 7.422 7.365 7.278*  PCO2ART 34.2* 41.0 44.0  PO2ART 85.4 152.0* 78.7*   Liver Enzymes  Recent Labs Lab 11/19/13 1401 11/23/13 0410  AST 31 18  ALT 55* 29  ALKPHOS 69 42  BILITOT 0.3 0.4  ALBUMIN 3.9 2.8*   Cardiac Enzymes  Recent  Labs Lab 11/23/13 0410  PROBNP 237.3*   Glucose  Recent Labs Lab 11/24/13 0455 11/24/13 0550 11/24/13 0654 11/24/13 0907 11/24/13 0949 11/24/13 1143  GLUCAP 109* 109* 108* 94 86 157*   CXR: None today  ASSESSMENT / PLAN:  COPD by history, no Rx preadmission.  -->  Goal SpO2>92 -->  Supplemental oxygen PRN -->  Albuterol PRN  DM. Hyperglycemia. -->  SSI -->  Lantus 15 -->  Hold Metformin  Anemia, likely of acute blood loss. -->  Trend CBC  Bowel angina, s/p vascular bypasses -->  Vascular following -->  Diet -->  Abx d/c'd  Acute CVA. -->  Neurology Following -->  ASA  Px. --> GI is not required --> Lovenox for VTE Px  I have personally obtained history, examined  patient, evaluated and interpreted laboratory and imaging results, reviewed medical records, formulated assessment / plan and placed orders.  Doree Fudge, MD [facility] [place] Pager: [phone]  11/24/2013, 8:39 PM

## 2013-11-24 NOTE — Progress Notes (Signed)
Patient ID: Perry Kennedy, male   DOB: Aug 31, 1941, 73 y.o.   MRN: [id number] Vascular Surgery Progress Note  Subjective: 2 days post aortobifemoral bypass grafting, SMA bypass, and reimplantation of IMA. Patient progressing slowly. Yesterday at 5:30 PM he developed right facial asymmetry and dysarthria. Seen by stroke team and neurologist who feels patient suffered small left brain CVA. CT scan of head was unremarkable with no acute stroke seen. Patient denies weakness in upper or lower extremities. Does complain of pain. PCA-dialogue lead was discontinued yesterday early afternoon and exchanged for when necessary IV Dilaudid   Objective:  Filed Vitals:   11/24/13 0800  BP: 147/64  Pulse: 105  Temp: 99 F (37.2 C)  Resp: 16    Gen. chronically ill-appearing male in no apparent distress alert and oriented x3--bilateral scleral edema-slightly diminished from yesterday Naso- tracheal tube in place and facemask oxygen in place Lungs bilateral rhonchi Abdomen not overly distended or tender Both lower extremities well perfused with positive PT Doppler flow    Labs:  Recent Labs Lab 11/23/13 0410 11/23/13 1545 11/24/13 0340  CREATININE 1.06 1.13 1.00    Recent Labs Lab 11/23/13 0410 11/23/13 1545 11/24/13 0340  NA 138 140 140  K 5.4* 4.9 4.5  CL 104 108 108  CO2 19 20 21   BUN 15 18 19   CREATININE 1.06 1.13 1.00  GLUCOSE 216* 116* 113*  CALCIUM 7.8* 7.9* 7.8*    Recent Labs Lab 11/23/13 0410 11/23/13 2000 11/24/13 0340  WBC 14.4* 16.5* 13.2*  HGB 11.7* 10.6* 9.7*  HCT 32.6* 30.2* 27.7*  PLT 188 169 165    Recent Labs Lab 11/22/13 1608 11/22/13 1853 11/24/13 0802  INR 1.39 1.48 1.20    I/O last 3 completed shifts: In: 5864.5 [P.O.:75; I.V.:4659.5; NG/GT:170; IV Piggyback:960] Out: 1870 [SWNIO:2703; Emesis/NG output:600]  Imaging: Ct Head Wo Contrast  11/23/2013   CLINICAL DATA:  Code stroke. Right-sided facial droop in abnormal speech.  EXAM: CT HEAD  WITHOUT CONTRAST  TECHNIQUE: Contiguous axial images were obtained from the base of the skull through the vertex without intravenous contrast.  COMPARISON:  None.  FINDINGS: No acute intracranial abnormalities identified. Specifically, no intra or extra-axial hemorrhage, hydrocephalus, mass effect, midline shift, or evidence of acute cortically based infarction. There is a 5 x 4 mm right calcification posteriorly and inferiorly in the right globe, of uncertain etiology or chronicity. Chronic appearing scattered sinus disease is noted. No air-fluid levels. Mastoid air cells are clear.  The skull is intact.  IMPRESSION: 1. No acute intracranial abnormality. This report was called to Dr. Aram Beecham at 6:34 p.m. 11/23/2013. 2. Focal calcification within the posterior inferior right globe, of uncertain etiology.   Electronically Signed   By: Curlene Dolphin M.D.   On: 11/23/2013 18:42   Dg Chest Portable 1 View  11/23/2013   CLINICAL DATA:  Postop.  EXAM: PORTABLE CHEST - 1 VIEW  COMPARISON:  11/22/2014  FINDINGS: Right jugular central line and nasogastric tube are still present. Nasogastric tube is in the stomach region. Patchy densities at lung bases are most compatible with atelectasis. Lucency near the right hemidiaphragm could represent free air and consistent with recent intra-abdominal surgery. No evidence for a pneumothorax. Heart size is within normal limits.  IMPRESSION: Patchy basilar densities are most compatible with atelectasis.  Evidence for free intraperitoneal air due to recent intra-abdominal surgery.   Electronically Signed   By: Markus Daft M.D.   On: 11/23/2013 08:30   Dg Chest Portable 1  View  11/22/2013   CLINICAL DATA:  Central line placement.  EXAM: PORTABLE CHEST - 1 VIEW  COMPARISON:  11/19/2013.  FINDINGS: There is a right IJ central venous Cordis in the right internal jugular vein near the junction with the subclavian vein. No pneumothorax. The NG tube is in the stomach. There are significant  chronic lung changes with probable emphysema and pulmonary scarring/ fibrosis. Stable eventration of the right hemidiaphragm. Low lung volumes with vascular crowding and atelectasis.  IMPRESSION: The right IJ Cordis is in the region of the internal jugulars subclavian junction.  The NG tube is in the stomach.  Chronic lung changes with superimposed atelectasis due to low lung volumes.   Electronically Signed   By: Kalman Jewels M.D.   On: 11/22/2013 21:11   Dg Abd Portable 1v  11/22/2013   CLINICAL DATA:  Status post abdominal aortic surgery. Right lower quadrant abdominal pain and nausea.  EXAM: PORTABLE ABDOMEN - 1 VIEW  COMPARISON:  CT scan 10/03/2013.  FINDINGS: The NG tube tip is in the body region of the stomach. The bowel gas pattern is unremarkable. No findings for obstruction an or perforation. The soft tissue shadows are grossly maintained despite motion artifact. Severe aortic iliac calcifications.  IMPRESSION: No plain film findings for an acute abdominal process.   Electronically Signed   By: Kalman Jewels M.D.   On: 11/22/2013 21:13    Assessment/Plan:  POD #2  LOS: 2 days  s/p Procedure(s): AORTA BIFEMORAL BYPASS GRAFT; SUPERIOR MESENTERIC ARTERY BYPASS, INFERIOR MESENTERIC BYPASS  #1 neurologic-appears to have suffered left brain CVA yesterday with resultant right facial weakness and dysarthria-no significant motor weakness-on ASA per neurology #2 GI-nasogastric drainage 500 cc overnight-will leave in place and keep n.p.o.-uncertain about patient's swallowing capabilities-no evidence of mesenteric ischemia #3 renal-creatinine 1.0-urine output 1200 cc past 24 hours-slight diuresis post Lasix yesterday and IV fluids currently at 10 cc per hour-will diurese further today #4 hematology-hematocrit 28% with platelet 166,000 #5 vascular-positive PT Doppler flow bilaterally with well-perfused feet  Plan continue to mobilize patient and will diurese further today-leave nasogastric in  place-continue to work on pulmonary toilet Follow neurologic status closely Appreciate CCM and neurology's assistance   Tinnie Gens, MD 11/24/2013 8:57 AM

## 2013-11-25 DIAGNOSIS — I6350 Cerebral infarction due to unspecified occlusion or stenosis of unspecified cerebral artery: Secondary | ICD-10-CM

## 2013-11-25 DIAGNOSIS — I639 Cerebral infarction, unspecified: Secondary | ICD-10-CM

## 2013-11-25 DIAGNOSIS — K551 Chronic vascular disorders of intestine: Secondary | ICD-10-CM

## 2013-11-25 LAB — BASIC METABOLIC PANEL
BUN: 16 mg/dL (ref 6–23)
CHLORIDE: 107 meq/L (ref 96–112)
CO2: 24 mEq/L (ref 19–32)
CREATININE: 0.82 mg/dL (ref 0.50–1.35)
Calcium: 8.5 mg/dL (ref 8.4–10.5)
GFR calc non Af Amer: 87 mL/min — ABNORMAL LOW (ref >90)
Glucose, Bld: 158 mg/dL — ABNORMAL HIGH (ref 70–99)
Potassium: 4.7 mEq/L (ref 3.7–5.3)
SODIUM: 144 meq/L (ref 137–147)

## 2013-11-25 LAB — CBC
HCT: 27.8 % — ABNORMAL LOW (ref 39.0–52.0)
Hemoglobin: 9.3 g/dL — ABNORMAL LOW (ref 13.0–17.0)
MCH: 29.5 pg (ref 26.0–34.0)
MCHC: 33.5 g/dL (ref 30.0–36.0)
MCV: 88.3 fL (ref 78.0–100.0)
PLATELETS: 167 10*3/uL (ref 150–400)
RBC: 3.15 MIL/uL — AB (ref 4.22–5.81)
RDW: 15.4 % (ref 11.5–15.5)
WBC: 11.2 10*3/uL — ABNORMAL HIGH (ref 4.0–10.5)

## 2013-11-25 LAB — GLUCOSE, CAPILLARY
GLUCOSE-CAPILLARY: 143 mg/dL — AB (ref 70–99)
GLUCOSE-CAPILLARY: 169 mg/dL — AB (ref 70–99)
Glucose-Capillary: 140 mg/dL — ABNORMAL HIGH (ref 70–99)
Glucose-Capillary: 151 mg/dL — ABNORMAL HIGH (ref 70–99)
Glucose-Capillary: 158 mg/dL — ABNORMAL HIGH (ref 70–99)

## 2013-11-25 MED ORDER — DOCUSATE SODIUM 50 MG/5ML PO LIQD
100.0000 mg | Freq: Every day | ORAL | Status: DC
  Administered 2013-11-25 – 2013-12-02 (×8): 100 mg via ORAL
  Filled 2013-11-25 (×9): qty 10

## 2013-11-25 MED ORDER — KETOROLAC TROMETHAMINE 30 MG/ML IJ SOLN
15.0000 mg | Freq: Four times a day (QID) | INTRAMUSCULAR | Status: AC
  Administered 2013-11-25: 15 mg via INTRAVENOUS
  Administered 2013-11-25: 15:00:00 via INTRAVENOUS
  Administered 2013-11-26 – 2013-11-28 (×9): 15 mg via INTRAVENOUS
  Filled 2013-11-25 (×13): qty 1

## 2013-11-25 MED ORDER — KETOROLAC TROMETHAMINE 30 MG/ML IJ SOLN
30.0000 mg | Freq: Four times a day (QID) | INTRAMUSCULAR | Status: DC
  Administered 2013-11-25: 30 mg via INTRAVENOUS
  Filled 2013-11-25: qty 1

## 2013-11-25 MED ORDER — KETOROLAC TROMETHAMINE 30 MG/ML IJ SOLN: 15.0000 mg | Freq: Four times a day (QID) | INTRAMUSCULAR | Status: DC

## 2013-11-25 MED FILL — Sodium Chloride Irrigation Soln 0.9%: Qty: 3000 | Status: AC

## 2013-11-25 MED FILL — Heparin Sodium (Porcine) Inj 1000 Unit/ML: INTRAMUSCULAR | Qty: 30 | Status: AC

## 2013-11-25 MED FILL — Sodium Chloride IV Soln 0.9%: INTRAVENOUS | Qty: 1000 | Status: AC

## 2013-11-25 NOTE — Evaluation (Signed)
Occupational Therapy Evaluation Patient Details Name: KRIS NO MRN: [id number] DOB: 07/29/1941 Today's Date: 11/25/2013 Time: 3500-9381 OT Time Calculation (min): 22 min  OT Assessment / Plan / Recommendation History of present illness s/p extensive aorta-bifemoral bypass graft.  Currently has NG tube placed. Post-op (and post-PT eval) Rt frontal CVA   Clinical Impression   Pt admitted with above. Will benefit from continued acute OT services to address below problem list. Recommend HHOT for d/c planning. Anticipate that pt will make good progress.    OT Assessment  Patient needs continued OT Services    Follow Up Recommendations  Home health OT;Supervision - Intermittent    Barriers to Discharge      Equipment Recommendations   (TBD)    Recommendations for Other Services    Frequency  Min 2X/week    Precautions / Restrictions Precautions Precautions: Fall   Pertinent Vitals/Pain See vitals    ADL  Grooming: Performed;Wash/dry hands;Wash/dry face;Set up Where Assessed - Grooming: Supported sitting Toilet Transfer: Pharmacologist Method: Arts development officer:  (bed<>chair) Equipment Used: Gait belt Transfers/Ambulation Related to ADLs: Min assist for SPT from bed to chair.  ADL Comments: Pt limited by pain and was very apprehensive to get up with therapy (worried that he would hurt more).  Pt agreeable to getting up out of bed.      OT Diagnosis: Generalized weakness;Acute pain  OT Problem List: Decreased strength;Decreased activity tolerance;Impaired balance (sitting and/or standing);Decreased knowledge of use of DME or AE;Pain OT Treatment Interventions: Self-care/ADL training;DME and/or AE instruction;Therapeutic activities;Patient/family education;Balance training;Energy conservation   OT Goals(Current goals can be found in the care plan section) Acute Rehab OT Goals Patient Stated Goal: return home to his  dog OT Goal Formulation: With patient Time For Goal Achievement: 12/09/13 Potential to Achieve Goals: Good  Visit Information  Last OT Received On: 11/25/13 Assistance Needed: +2 (for lines) PT/OT/SLP Co-Evaluation/Treatment: Yes Reason for Co-Treatment: For patient/therapist safety;Other (comment) (pt apprehensive re: mobility and incr pain ) PT goals addressed during session: Mobility/safety with mobility;Balance History of Present Illness: s/p extensive aorta-bifemoral bypass graft.  Currently has NG tube placed. Post-op (and post-PT eval) Rt frontal CVA       Prior Functioning     Home Living Family/patient expects to be discharged to:: Private residence Living Arrangements: Alone Available Help at Discharge: Family;Available PRN/intermittently Type of Home: Mobile home Home Access: Stairs to enter Entrance Stairs-Number of Steps: 6 Entrance Stairs-Rails: Right;Left;Can reach both Home Layout: One level Home Equipment: Wheelchair - manual (his mother's w/c) Additional Comments: 2 sons nearby Prior Function Level of Independence: Independent Communication Communication: Expressive difficulties Dominant Hand: Right         Vision/Perception     Cognition  Cognition Arousal/Alertness: Awake/alert Behavior During Therapy: WFL for tasks assessed/performed Overall Cognitive Status: Within Functional Limits for tasks assessed    Extremity/Trunk Assessment Upper Extremity Assessment Upper Extremity Assessment: Generalized weakness;LUE deficits/detail (equal bilaterally) LUE Coordination: decreased gross motor (slight deficit)     Mobility Bed Mobility Bed Mobility: Rolling Left;Left Sidelying to Sit;Sitting - Scoot to [employer] of Bed Rolling Left: 4: Min assist;With rail Left Sidelying to Sit: 4: Min assist;With rails;HOB elevated Sitting - Scoot to [employer] of Bed: 4: Min guard Details for Bed Mobility Assistance: vc for technique to minimize abd pain/strain;  encouragement and educated re: role of incr activity in stimulating his gut to work Transfers Transfers: Sit to [employer] to [employer] to Stand: 4: Min assist Stand  to Sit: 4: Min guard Details for Transfer Assistance: hands-on assist for safety and balance     Exercise     Balance Balance Balance Assessed: Yes Static Sitting Balance Static Sitting - Balance Support: No upper extremity supported;Feet supported Static Sitting - Level of Assistance: 5: Stand by assistance Static Standing Balance Static Standing - Balance Support: Right upper extremity supported Static Standing - Level of Assistance: 4: Min assist Static Standing - Comment/# of Minutes: 45 seconds   End of Session OT - End of Session Equipment Utilized During Treatment: Gait belt;Oxygen Activity Tolerance: Patient tolerated treatment well Patient left: in chair;with call bell/phone within reach Nurse Communication: Mobility status  GO    11/25/2013 Darrol Jump OTR/L Pager [phone] Office [phone]  Darrol Jump 11/25/2013, 11:53 AM

## 2013-11-25 NOTE — Clinical Documentation Improvement (Signed)
THIS DOCUMENT IS NOT A PERMANENT PART OF THE MEDICAL RECORD  Please update your documentation with the medical record to reflect your response to this query. If you need help knowing how to do this please call [phone].  11/25/13   Dear Doctor,  Per consult note 1/3:"Volume Overload/Pulmonary Edema" treated with Lasix diuresis.  In the Coding world the acuity is a significant qualifier. Please indicate in Notes and DC summary if this is Acute Pulmonary Edema or another condition. Thank you.  You may use possible, probable, or suspect with inpatient documentation. possible, probable, suspected diagnoses MUST be documented at the time of discharge  Reviewed:  no additional documentation provided  Thank You,  [place] Documentation Specialist: [place]

## 2013-11-25 NOTE — Progress Notes (Signed)
R IJ sleeve unable to draw back blood, or infuse fluid.  Catheter kinked and twisted, d/c'd, pressure held and pressure dsg applied.

## 2013-11-25 NOTE — Progress Notes (Signed)
Rehab Admissions Coordinator Note:  Patient was screened by Retta Diones for appropriateness for an Inpatient Acute Rehab Consult.  At this time, we are recommending Inpatient Rehab consult.  Jodell Cipro M 11/25/2013, 9:22 AM  I can be reached at [phone].

## 2013-11-25 NOTE — Progress Notes (Signed)
Physical Therapy Treatment Patient Details Name: Perry Kennedy MRN: [id number] DOB: Jun 23, 1941 Today's Date: 11/25/2013 Time: 5427-0623 PT Time Calculation (min): 17 min  PT Assessment / Plan / Recommendation  History of Present Illness s/p extensive aorta-bifemoral bypass graft.  Currently has NG tube placed. Post-op (and post-PT eval) Rt frontal CVA   PT Comments   Despite new diagnosis of Rt frontal CVA since PT evaluation, pt mobilizing better today. Pt reluctant to move due to fear of pain and requires max encouragement. After mobility, he admitted the pain was "not too bad." Anticipate good progress.   Follow Up Recommendations  Home health PT;Supervision - Intermittent     Does the patient have the potential to tolerate intense rehabilitation     Barriers to Discharge        Equipment Recommendations  Other (comment) (TBA with further balance assessment)    Recommendations for Other Services    Frequency Min 3X/week   Progress towards PT Goals Progress towards PT goals: Progressing toward goals (despite new diagnosis, goals still approp)  Plan Discharge plan needs to be updated    Precautions / Restrictions Precautions Precautions: Fall   Pertinent Vitals/Pain abd pain; pt unable/willing to rate    Mobility  Bed Mobility Bed Mobility: Rolling Left;Left Sidelying to Sit;Sitting - Scoot to [employer] of Bed Rolling Left: 4: Min assist;With rail Left Sidelying to Sit: 4: Min assist;With rails;HOB elevated Sitting - Scoot to [employer] of Bed: 4: Min guard Details for Bed Mobility Assistance: vc for technique to minimize abd pain/strain; encouragement and educated re: role of incr activity in stimulating his gut to work Transfers Transfers: Sit to [employer] to [employer] to Stand: 4: Min assist Stand to Sit: 4: Min guard Details for Transfer Assistance: hands-on assist for safety and balance Ambulation/Gait Ambulation/Gait Assistance: 4: Min assist Ambulation Distance (Feet):  2 Feet Assistive device: None Ambulation/Gait Assistance Details: pt refused further ambulation or standing balance activities Modified Rankin (Stroke Patients Only) Pre-Morbid Rankin Score: No symptoms Modified Rankin: Moderately severe disability    Exercises     PT Diagnosis:    PT Problem List:   PT Treatment Interventions:     PT Goals (current goals can now be found in the care plan section) Acute Rehab PT Goals Patient Stated Goal: return home to his dog  Visit Information  Last PT Received On: 11/25/13 Assistance Needed: +2 (for lines) PT/OT/SLP Co-Evaluation/Treatment: Yes Reason for Co-Treatment: For patient/therapist safety;Other (comment) (pt apprehensive re: mobility and incr pain ) PT goals addressed during session: Mobility/safety with mobility;Balance History of Present Illness: s/p extensive aorta-bifemoral bypass graft.  Currently has NG tube placed. Post-op (and post-PT eval) Rt frontal CVA    Subjective Data  Subjective: reports he just got comfortable and reluctant/anxious re: mobility Patient Stated Goal: return home to his dog   Cognition  Cognition Arousal/Alertness: Awake/alert Behavior During Therapy: WFL for tasks assessed/performed Overall Cognitive Status: Within Functional Limits for tasks assessed    Balance  Balance Balance Assessed: Yes Static Sitting Balance Static Sitting - Balance Support: No upper extremity supported;Feet supported Static Sitting - Level of Assistance: 5: Stand by assistance Static Standing Balance Static Standing - Balance Support: Right upper extremity supported Static Standing - Level of Assistance: 4: Min assist Static Standing - Comment/# of Minutes: 45 seconds  End of Session PT - End of Session Equipment Utilized During Treatment: Oxygen Activity Tolerance: Patient tolerated treatment well Patient left: in chair;with call bell/phone within reach Nurse Communication: Mobility  status   GP      Kristianne Albin 11/25/2013, 11:33 AM Pager [phone]

## 2013-11-25 NOTE — Progress Notes (Signed)
Subjective  - POD #3, s/p ABF with IMA re-implantation and SMA bypass  Extubated in the OR Found to have acute CVA on POD#1 C/o pain   Physical Exam:  Left facial droop and slurred speech abd distended and painful Ext:  Warm Non-labored breathing       Assessment/Plan:  POD #3  Acute CVA:  Appreciate neuro input.  MRI reveals acute right frontal lobe infarct with small amt of petechial hemorrhage.  Echo negative for cardiac source.  Pre-op carotid dopplers show 40-60% bilateral stenosis.  con't with ASA and medical management.  Will need ST/OT/PT consults  Acute blood loss anemia:  HCT stable, con't to follow.  May need tx if continues to fall  GI:  Keep NG in place as still with a fair amount of output (1.1L)  Pain:  Still with a sig amount of pain.  Has been confused with narcotics.  Will start Toradol and monitor renal function  ABX:  Pt came in on cipro/flagyl for possible diverticulitis.  These will be discontinued.  Prophylaxis:  con't lovenox  Abagail Limb IV, V. WELLS 11/25/2013 7:41 AM --  Filed Vitals:   11/25/13 0714  BP:   Pulse:   Temp: 98.7 F (37.1 C)  Resp:     Intake/Output Summary (Last 24 hours) at 11/25/13 0741 Last data filed at 11/25/13 0700  Gross per 24 hour  Intake 3299.9 ml  Output   2185 ml  Net 1114.9 ml     Laboratory CBC    Component Value Date/Time   WBC 13.2* 11/24/2013 0340   WBC 10.0 10/23/2013 1527   WBC 13.8* 10/03/2013 1632   HGB 9.7* 11/24/2013 0340   HGB 15.6 10/03/2013 1632   HCT 27.7* 11/24/2013 0340   HCT 47.4 10/03/2013 1632   PLT 165 11/24/2013 0340    BMET    Component Value Date/Time   NA 140 11/24/2013 0340   NA 135 10/23/2013 1527   K 4.5 11/24/2013 0340   CL 108 11/24/2013 0340   CO2 21 11/24/2013 0340   GLUCOSE 113* 11/24/2013 0340   GLUCOSE 123* 10/23/2013 1527   BUN 19 11/24/2013 0340   BUN 11 10/23/2013 1527   CREATININE 1.00 11/24/2013 0340   CALCIUM 7.8* 11/24/2013 0340   GFRNONAA 74* 11/24/2013 0340   GFRAA  85* 11/24/2013 0340    COAG Lab Results  Component Value Date   INR 1.20 11/24/2013   INR 1.48 11/22/2013   INR 1.39 11/22/2013   No results found for this basename: PTT    Antibiotics Anti-infectives   Start     Dose/Rate Route Frequency Ordered Stop   11/23/13 0000  metroNIDAZOLE (FLAGYL) IVPB 500 mg     500 mg 100 mL/hr over 60 Minutes Intravenous 4 times per day 11/22/13 2147     11/22/13 2200  ciprofloxacin (CIPRO) IVPB 400 mg     400 mg 200 mL/hr over 60 Minutes Intravenous Every 12 hours 11/22/13 1854     11/22/13 1515  cefUROXime (ZINACEF) 1.5 g in dextrose 5 % 50 mL IVPB     1.5 g 100 mL/hr over 30 Minutes Intravenous To Surgery 11/22/13 1501 11/23/13 1515   11/21/13 1439  cefUROXime (ZINACEF) 1.5 g in dextrose 5 % 50 mL IVPB  Status:  Discontinued     1.5 g 100 mL/hr over 30 Minutes Intravenous 30 min pre-op 11/21/13 1439 11/22/13 1545       V. Leia Alf, M.D. [facility] Office: [phone] Pager:  [phone]

## 2013-11-25 NOTE — Progress Notes (Signed)
UR Completed.  Ameliana Brashear Jane [phone] 11/25/2013  

## 2013-11-25 NOTE — Progress Notes (Signed)
Thank you for consult on Mr. Perry Kennedy.  Chart reviewed and patient examined. He did well with PT/OT today and Home health therapies recommended for follow up. Patient focused on going home and not interested in rehab at this time. Will follow along at a distance as anticipate patient will progress to independence level with therapies over the next few days.

## 2013-11-25 NOTE — Progress Notes (Addendum)
Stroke Team Progress Note  HISTORY Perry GandyBobby L Kennedy is an 73 y.o. male with a past medical history significant for HTN, hyperlipidemia, DM, CAD, PVD, COPD, gout, s/p extensive aorto-bifemoral bypass graft on 11/22/13, with acute onset of the left face weakness and dysarthria post-op. He was reportedly last known well at 530 pm 1/03 when nursing staff noticed that his speech was garbled, had left face weakness and was less responsive. He has been on a dilaudid PCA pump since surgery and has been drowsy during the day but able to speak fluently. Urgent CT brain revealed no acute abnormality. Initial NIHSS 5. Patient was not a TPA candidate secondary to recent surgery.  He remained on 2S for further evaluation and treatment.  SUBJECTIVE No family at bedside. Dr. Pearlean BrownieSethi spoke with son over the phone.  OBJECTIVE Most recent Vital Signs: Filed Vitals:   11/25/13 0500 11/25/13 0600 11/25/13 0700 11/25/13 0714  BP: 151/76 147/71 173/82   Pulse: 100 91 95   Temp:    98.7 F (37.1 C)  TempSrc:    Oral  Resp: 15 16 20    Height:      Weight:  66 kg (145 lb 8.1 oz)    SpO2: 96% 98% 97%    CBG (last 3)   Recent Labs  11/24/13 2355 11/25/13 0305 11/25/13 0711  GLUCAP 158* 140* 143*    IV Fluid Intake:   . sodium chloride 125 mL/hr at 11/25/13 0447  . DOPamine    . insulin (NOVOLIN-R) infusion 1.5 Units/hr (11/24/13 0500)  . lactated ringers 20 mL/hr at 11/22/13 0832  . lactated ringers 50 mL/hr at 11/22/13 1119    MEDICATIONS  . aspirin  300 mg Rectal Daily  . docusate  100 mg Oral Daily  . enoxaparin (LOVENOX) injection  30 mg Subcutaneous Q24H  . insulin aspart  1-3 Units Subcutaneous Q4H  . insulin glargine  15 Units Subcutaneous Q24H  . ketorolac  30 mg Intravenous Q6H  . pantoprazole  40 mg Oral Daily   PRN:  acetaminophen, acetaminophen, albuterol, alum & mag hydroxide-simeth, bisacodyl, guaiFENesin-dextromethorphan, hydrALAZINE, HYDROmorphone (DILAUDID) injection, influenza vac  split quadrivalent PF, labetalol, labetalol, magnesium sulfate 1 - 4 g bolus IVPB, metoprolol, naLOXone (NARCAN)  injection, phenol, potassium chloride, senna-docusate  Diet:  NPO  Activity:  OOB DVT Prophylaxis:  Lovenox 30 mg sq daily   CLINICALLY SIGNIFICANT STUDIES Basic Metabolic Panel:  Recent Labs Lab 11/22/13 1853 11/23/13 0410  11/24/13 0340 11/25/13 0737  NA 137 138  < > 140 144  K 5.3 5.4*  < > 4.5 4.7  CL 105 104  < > 108 107  CO2 19 19  < > 21 24  GLUCOSE 246* 216*  < > 113* 158*  BUN 12 15  < > 19 16  CREATININE 0.70 1.06  < > 1.00 0.82  CALCIUM 7.4* 7.8*  < > 7.8* 8.5  MG 1.1* 2.7*  --   --   --   PHOS  --  4.8*  --   --   --   < > = values in this interval not displayed. Liver Function Tests:   Recent Labs Lab 11/19/13 1401 11/23/13 0410  AST 31 18  ALT 55* 29  ALKPHOS 69 42  BILITOT 0.3 0.4  PROT 7.4 5.1*  ALBUMIN 3.9 2.8*   CBC:   Recent Labs Lab 11/24/13 0340 11/25/13 0737  WBC 13.2* 11.2*  HGB 9.7* 9.3*  HCT 27.7* 27.8*  MCV 85.0 88.3  PLT 165 167   Coagulation:   Recent Labs Lab 11/19/13 1401 11/22/13 1608 11/22/13 1853 11/24/13 0802  LABPROT 11.5* 16.7* 17.5* 14.9  INR 0.85 1.39 1.48 1.20   Cardiac Enzymes: No results found for this basename: CKTOTAL, CKMB, CKMBINDEX, TROPONINI,  in the last 168 hours Urinalysis:   Recent Labs Lab 11/19/13 1400  COLORURINE YELLOW  LABSPEC 1.013  PHURINE 6.0  GLUCOSEU 500*  HGBUR NEGATIVE  BILIRUBINUR NEGATIVE  KETONESUR NEGATIVE  PROTEINUR 30*  UROBILINOGEN 0.2  NITRITE NEGATIVE  LEUKOCYTESUR NEGATIVE   Lipid Panel    Component Value Date/Time   CHOL 85 11/24/2013 0340   TRIG 117 11/24/2013 0340   HDL 17* 11/24/2013 0340   CHOLHDL 5.0 11/24/2013 0340   VLDL 23 11/24/2013 0340   LDLCALC 45 11/24/2013 0340   HgbA1C  Lab Results  Component Value Date   HGBA1C 6.6* 11/23/2013    Urine Drug Screen:   No results found for this basename: labopia,  cocainscrnur,  labbenz,  amphetmu,   thcu,  labbarb    Alcohol Level: No results found for this basename: ETH,  in the last 168 hours   CT of the brain  11/23/2013    1. No acute intracranial abnormality. 2. Focal calcification within the posterior inferior right globe, of uncertain etiology.   MRI of the brain  11/24/2013   Acute right frontal lobe infarct with a small amount of petechial hemorrhage.   MRA of the brain  11/24/2013 No evidence of major intracranial arterial occlusion or high-grade stenosis.    2D Echocardiogram  EF 60-65% with no source of embolus.   Carotid Doppler  No evidence of hemodynamically significant internal carotid artery stenosis. Vertebral artery flow is antegrade.   CXR  11/23/2013 Patchy basilar densities are most compatible with atelectasis.  Evidence for free intraperitoneal air due to recent intra-abdominal surgery.   Therapy Recommendations CIR  Physical Exam   Mental Status:  Alert, oriented, thought content appropriate. Mild dysarthria without evidence of aphasia. Able to follow 3 step commands without difficulty.  Cranial Nerves:  II: Pupils equal, round, reactive to light III,IV, VI: ptosis not present, extra-ocular motions intact bilaterally  V,VII: smile asymmetric with left face weakness, facial light touch sensation normal bilaterally  VIII: hearing normal bilaterally  IX,X: gag reflex present  XI: bilateral shoulder shrug  XII: midline tongue extension without atrophy or fasciculations  Motor:  Right : Upper extremity 5/5 Left: Upper extremity 5/5  Lower extremity 5/5 Lower extremity 5/5  Tone and bulk:normal tone throughout; no atrophy noted  Sensory: Pinprick and light touch intact throughout, bilaterally  Deep Tendon Reflexes:  Right: Upper Extremity Left: Upper extremity  biceps (C-5 to C-6) 2/4 biceps (C-5 to C-6) 2/4  tricep (C7) 2/4 triceps (C7) 2/4  Brachioradialis (C6) 2/4 Brachioradialis (C6) 2/4  Lower Extremity Lower Extremity  quadriceps (L-2 to L-4) 2/4  quadriceps (L-2 to L-4) 2/4  Achilles (S1) 2/4 Achilles (S1) 2/4  Plantars:  Right: downgoing Left: downgoing  Cerebellar:  normal finger-to-nose, normal heel-to-shin test   ASSESSMENT Mr. Perry Kennedy is a 73 y.o. male presenting with dysarthria and left facial weakness post aorto-bifemoral bypass graft. 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 Etiology unclear. No documented atrial fibrillation post op.  On ASA 325mg  prior to admission. Now on ASA 300mg  PR for secondary stroke prevention. Work up underway.   Hyptertension Hyperlipidemia, LDL 45, on lipitor 40 mg daily, now on no statin, at goal LDL < 70  for diabetics Diabetes, HgbA1c 6.5, goal < 7.0  CAD  PVD - S/p aorto-bifemoral bypass graft on 11/22/2013  Hx DVT  Cigarette smoker  Hospital day # 3  TREATMENT/PLAN  Continue ASA 300mg  PR  Rehab consult  Resume statin prior to discharge TEE to look for embolic source once safe to remove NG (currently still to suction).  If TEE negative, a will have cardiology consult and place implantable loop recorder to evaluate for atrial fibrillation as etiology of stroke. This has been explained to patient/family by Dr. Leonie Man and they are agreeable.  Burnetta Sabin, MSN, RN, ANVP-BC, ANP-BC, [facility] Pager: [phone] 11/25/2013 10:44 AM  I have personally obtained a history, examined the patient, evaluated imaging results, and formulated the assessment and plan of care. I agree with the above.  Antony Contras, MD

## 2013-11-26 LAB — TYPE AND SCREEN
ABO/RH(D): A POS
Antibody Screen: NEGATIVE
Unit division: 0
Unit division: 0
Unit division: 0
Unit division: 0

## 2013-11-26 LAB — CBC
HEMATOCRIT: 24.9 % — AB (ref 39.0–52.0)
Hemoglobin: 8.4 g/dL — ABNORMAL LOW (ref 13.0–17.0)
MCH: 29.7 pg (ref 26.0–34.0)
MCHC: 33.7 g/dL (ref 30.0–36.0)
MCV: 88 fL (ref 78.0–100.0)
PLATELETS: 152 10*3/uL (ref 150–400)
RBC: 2.83 MIL/uL — ABNORMAL LOW (ref 4.22–5.81)
RDW: 15.3 % (ref 11.5–15.5)
WBC: 6.5 10*3/uL (ref 4.0–10.5)

## 2013-11-26 LAB — BASIC METABOLIC PANEL
BUN: 27 mg/dL — AB (ref 6–23)
CALCIUM: 8.2 mg/dL — AB (ref 8.4–10.5)
CHLORIDE: 110 meq/L (ref 96–112)
CO2: 23 mEq/L (ref 19–32)
CREATININE: 0.84 mg/dL (ref 0.50–1.35)
GFR calc Af Amer: >90 mL/min (ref >90)
GFR calc non Af Amer: 86 mL/min — ABNORMAL LOW (ref >90)
Glucose, Bld: 126 mg/dL — ABNORMAL HIGH (ref 70–99)
Potassium: 3.7 mEq/L (ref 3.7–5.3)
Sodium: 148 mEq/L — ABNORMAL HIGH (ref 137–147)

## 2013-11-26 LAB — GLUCOSE, CAPILLARY
GLUCOSE-CAPILLARY: 106 mg/dL — AB (ref 70–99)
GLUCOSE-CAPILLARY: 114 mg/dL — AB (ref 70–99)
Glucose-Capillary: 110 mg/dL — ABNORMAL HIGH (ref 70–99)
Glucose-Capillary: 127 mg/dL — ABNORMAL HIGH (ref 70–99)
Glucose-Capillary: 96 mg/dL (ref 70–99)

## 2013-11-26 MED ORDER — BISACODYL 10 MG RE SUPP
10.0000 mg | Freq: Once | RECTAL | Status: AC
  Administered 2013-11-26: 10 mg via RECTAL
  Filled 2013-11-26: qty 1

## 2013-11-26 MED ORDER — PANTOPRAZOLE SODIUM 40 MG PO PACK
40.0000 mg | PACK | Freq: Every day | ORAL | Status: DC
  Administered 2013-11-26 – 2013-12-02 (×7): 40 mg
  Filled 2013-11-26 (×9): qty 20

## 2013-11-26 MED ORDER — FUROSEMIDE 10 MG/ML IJ SOLN
20.0000 mg | Freq: Once | INTRAMUSCULAR | Status: AC
  Administered 2013-11-26: 20 mg via INTRAVENOUS
  Filled 2013-11-26: qty 2

## 2013-11-26 MED ORDER — POTASSIUM CHLORIDE 10 MEQ/100ML IV SOLN
10.0000 meq | INTRAVENOUS | Status: AC
  Administered 2013-11-26 (×2): 10 meq via INTRAVENOUS
  Filled 2013-11-26 (×2): qty 100

## 2013-11-26 NOTE — Evaluation (Signed)
Clinical/Bedside Swallow Evaluation Patient Details  Name: Perry Kennedy MRN: [id number] Date of Birth: October 19, 1941  Today's Date: 11/26/2013 Time: 2536-6440 SLP Time Calculation (min): 53 min  Past Medical History:  Past Medical History  Diagnosis Date  . Hyperlipidemia   . GERD (gastroesophageal reflux disease)   . Hypertension   . COPD (chronic obstructive pulmonary disease)   . Insomnia   . Gout   . DVT (deep venous thrombosis)   . Peripheral vascular disease   . Diabetes mellitus without complication   . Headache(784.0)     migranes- not in a few years  . Coronary artery disease    Past Surgical History:  Past Surgical History  Procedure Laterality Date  . Hemicolectomy     HPI:  Perry Kennedy is an 73 y.o. male with a past medical history significant for HTN, hyperlipidemia, DM, CAD, PVD, COPD, gout, s/p extensive aorto-bifemoral bypass graft on 11/22/13, with acute onset of the left face weakness and dysarthria post-op. MRI reveals acute right frontal lobe infarct with small amt of petechial hemorrhage.    Assessment / Plan / Recommendation Clinical Impression  Pt presents with moderate oral dysphagia secondary to decreased oral motor strength. Pt observed pocketing in left lateral sulci with mech soft solids and difficulty clearing oral residuals with multiple sips of thin liquid, eventually cleared with bite of puree. During cup sips of thin liquid, pt noted to cough when ice chips were mixed with thin liquids, pt stated he felt choked up on the ice chips. Pt judged unsafe on mixed consistencies with thin liquids. Pt observed for prolonged time with straw sips of thin liquid, no overt s/s of aspiration noted however pt impulsive with consecutive sips and cuing ineffective for safe swallowing strategies. Recommend Dys 1 (puree) diet and thin liquids with full supervision and medicine crushed in puree to reduce risk of aspiration. Education provided to pt and family (pt's  brothers) regarding aspiration risk and safe swallowing precautions.     Aspiration Risk  Moderate    Diet Recommendation Dysphagia 1 (Puree);Thin liquid   Liquid Administration via: Straw Medication Administration: Crushed with puree Supervision: Patient able to self feed;Full supervision/cueing for compensatory strategies Compensations: Slow rate;Small sips/bites;Check for pocketing;Check for anterior loss;Multiple dry swallows after each bite/sip Postural Changes and/or Swallow Maneuvers: Seated upright 90 degrees;Upright 30-60 min after meal    Other  Recommendations Oral Care Recommendations: Oral care BID   Follow Up Recommendations  Inpatient Rehab    Frequency and Duration min 2x/week  2 weeks   Pertinent Vitals/Pain No c/o pain    SLP Swallow Goals  See Care Plan   Swallow Study Prior Functional Status   WFL    General HPI: Perry Kennedy is an 73 y.o. male with a past medical history significant for HTN, hyperlipidemia, DM, CAD, PVD, COPD, gout, s/p extensive aorto-bifemoral bypass graft on 11/22/13, with acute onset of the left face weakness and dysarthria post-op. MRI reveals acute right frontal lobe infarct with small amt of petechial hemorrhage.  Type of Study: Bedside swallow evaluation Diet Prior to this Study: NPO Temperature Spikes Noted: No Respiratory Status: Room air Behavior/Cognition: Alert;Cooperative;Impulsive;Requires cueing Oral Cavity - Dentition: Missing dentition Self-Feeding Abilities: Able to feed self Patient Positioning: Upright in bed Baseline Vocal Quality: Hoarse Volitional Cough: Strong Volitional Swallow: Able to elicit    Oral/Motor/Sensory Function Overall Oral Motor/Sensory Function: Impaired Labial ROM: Reduced left Labial Symmetry: Abnormal symmetry left Labial Strength: Reduced Lingual ROM: Reduced left Lingual  Strength: Reduced Facial ROM: Reduced left Facial Symmetry: Left droop   Ice Chips     Thin Liquid Thin  Liquid: Within functional limits Presentation: Cup;Straw    Nectar Thick Nectar Thick Liquid: Within functional limits Presentation: Cup   Honey Thick     Puree Puree: Within functional limits Presentation: Self Fed;Spoon   Solid   GO    Solid: Impaired Presentation: Self Fed Oral Phase Impairments: Reduced lingual movement/coordination;Impaired anterior to posterior transit Oral Phase Functional Implications: Left lateral sulci pocketing;Oral residue Pharyngeal Phase Impairments: Suspected delayed Swallow;Decreased hyoid-laryngeal movement;Multiple swallows       Carola Frost MA CCC-SLP 11/26/2013,6:14 PM

## 2013-11-26 NOTE — Progress Notes (Signed)
Stroke Team Progress Note  HISTORY Perry Kennedy is an 73 y.o. male with a past medical history significant for HTN, hyperlipidemia, DM, CAD, PVD, COPD, gout, s/p extensive aorto-bifemoral bypass graft on 11/22/13, with acute onset of the left face weakness and dysarthria post-op. He was reportedly last known well at 530 pm 1/03 when nursing staff noticed that his speech was garbled, had left face weakness and was less responsive. He has been on a dilaudid PCA since surgery and has been drowsy during the day but able to speak fluently. Urgent CT brain revealed no acute abnormality. Initial NIHSS 5. Patient was not a TPA candidate secondary to recent surgery.  He remained on 2S for further evaluation and treatment.  SUBJECTIVE No family at bedside.  OBJECTIVE Most recent Vital Signs: Filed Vitals:   11/26/13 0500 11/26/13 0600 11/26/13 0700 11/26/13 0714  BP: 148/71 125/105 141/67   Pulse: 81 85 78   Temp:    98.1 F (36.7 C)  TempSrc:    Oral  Resp: 17 16 13    Height:      Weight:      SpO2: 97% 98% 97%    CBG (last 3)   Recent Labs  11/26/13 0012 11/26/13 0420 11/26/13 0713  GLUCAP 110* 127* 114*    IV Fluid Intake:   . sodium chloride 125 mL (11/25/13 2239)  . DOPamine    . insulin (NOVOLIN-R) infusion 1.5 Units/hr (11/24/13 0500)  . lactated ringers 20 mL/hr at 11/22/13 0832  . lactated ringers 50 mL/hr at 11/22/13 1119    MEDICATIONS  . aspirin  300 mg Rectal Daily  . bisacodyl  10 mg Rectal Once  . docusate  100 mg Oral Daily  . enoxaparin (LOVENOX) injection  30 mg Subcutaneous Q24H  . insulin aspart  1-3 Units Subcutaneous Q4H  . insulin glargine  15 Units Subcutaneous Q24H  . ketorolac  15 mg Intravenous Q6H  . pantoprazole  40 mg Oral Daily   PRN:  acetaminophen, acetaminophen, albuterol, alum & mag hydroxide-simeth, bisacodyl, guaiFENesin-dextromethorphan, hydrALAZINE, HYDROmorphone (DILAUDID) injection, influenza vac split quadrivalent PF, labetalol,  labetalol, magnesium sulfate 1 - 4 g bolus IVPB, metoprolol, naLOXone (NARCAN)  injection, phenol, potassium chloride, senna-docusate  Diet:  NPO  Activity:  OOB DVT Prophylaxis:  Lovenox 30 mg sq daily   CLINICALLY SIGNIFICANT STUDIES Basic Metabolic Panel:  Recent Labs Lab 11/22/13 1853 11/23/13 0410  11/25/13 0737 11/26/13 0355  NA 137 138  < > 144 148*  K 5.3 5.4*  < > 4.7 3.7  CL 105 104  < > 107 110  CO2 19 19  < > 24 23  GLUCOSE 246* 216*  < > 158* 126*  BUN 12 15  < > 16 27*  CREATININE 0.70 1.06  < > 0.82 0.84  CALCIUM 7.4* 7.8*  < > 8.5 8.2*  MG 1.1* 2.7*  --   --   --   PHOS  --  4.8*  --   --   --   < > = values in this interval not displayed. Liver Function Tests:   Recent Labs Lab 11/19/13 1401 11/23/13 0410  AST 31 18  ALT 55* 29  ALKPHOS 69 42  BILITOT 0.3 0.4  PROT 7.4 5.1*  ALBUMIN 3.9 2.8*   CBC:   Recent Labs Lab 11/25/13 0737 11/26/13 0355  WBC 11.2* 6.5  HGB 9.3* 8.4*  HCT 27.8* 24.9*  MCV 88.3 88.0  PLT 167 152   Coagulation:  Recent Labs Lab 11/19/13 1401 11/22/13 1608 11/22/13 1853 11/24/13 0802  LABPROT 11.5* 16.7* 17.5* 14.9  INR 0.85 1.39 1.48 1.20   Cardiac Enzymes: No results found for this basename: CKTOTAL, CKMB, CKMBINDEX, TROPONINI,  in the last 168 hours Urinalysis:   Recent Labs Lab 11/19/13 1400  COLORURINE YELLOW  LABSPEC 1.013  PHURINE 6.0  GLUCOSEU 500*  HGBUR NEGATIVE  BILIRUBINUR NEGATIVE  KETONESUR NEGATIVE  PROTEINUR 30*  UROBILINOGEN 0.2  NITRITE NEGATIVE  LEUKOCYTESUR NEGATIVE   Lipid Panel    Component Value Date/Time   CHOL 85 11/24/2013 0340   TRIG 117 11/24/2013 0340   HDL 17* 11/24/2013 0340   CHOLHDL 5.0 11/24/2013 0340   VLDL 23 11/24/2013 0340   LDLCALC 45 11/24/2013 0340   HgbA1C  Lab Results  Component Value Date   HGBA1C 6.6* 11/23/2013    Urine Drug Screen:   No results found for this basename: labopia,  cocainscrnur,  labbenz,  amphetmu,  thcu,  labbarb    Alcohol Level: No  results found for this basename: ETH,  in the last 168 hours   CT of the brain  11/23/2013    1. No acute intracranial abnormality. 2. Focal calcification within the posterior inferior right globe, of uncertain etiology.   MRI of the brain  11/24/2013   Acute right frontal lobe infarct with a small amount of petechial hemorrhage.   MRA of the brain  11/24/2013 No evidence of major intracranial arterial occlusion or high-grade stenosis.    2D Echocardiogram  EF 60-65% with no source of embolus.   Carotid Doppler  No evidence of hemodynamically significant internal carotid artery stenosis. Vertebral artery flow is antegrade.   CXR  11/23/2013 Patchy basilar densities are most compatible with atelectasis.  Evidence for free intraperitoneal air due to recent intra-abdominal surgery.   Therapy Recommendations CIR  Physical Exam   Mental Status:  Alert, oriented, thought content appropriate. Mild dysarthria without evidence of aphasia. Able to follow 3 step commands without difficulty.  Cranial Nerves:  II: Pupils equal, round, reactive to light III,IV, VI: ptosis not present, extra-ocular motions intact bilaterally  V,VII: smile asymmetric with left face weakness, facial light touch sensation normal bilaterally  VIII: hearing normal bilaterally  IX,X: gag reflex present  XI: bilateral shoulder shrug  XII: midline tongue extension without atrophy or fasciculations  Motor:  Right : Upper extremity 5/5 Left: Upper extremity 5/5  Lower extremity 5/5 Lower extremity 5/5  Tone and bulk:normal tone throughout; no atrophy noted  Sensory: Pinprick and light touch intact throughout, bilaterally  Deep Tendon Reflexes:  Right: Upper Extremity Left: Upper extremity  biceps (C-5 to C-6) 2/4 biceps (C-5 to C-6) 2/4  tricep (C7) 2/4 triceps (C7) 2/4  Brachioradialis (C6) 2/4 Brachioradialis (C6) 2/4  Lower Extremity Lower Extremity  quadriceps (L-2 to L-4) 2/4 quadriceps (L-2 to L-4) 2/4  Achilles (S1)  2/4 Achilles (S1) 2/4  Plantars:  Right: downgoing Left: downgoing  Cerebellar:  normal finger-to-nose, normal heel-to-shin test   ASSESSMENT Mr. Perry Kennedy is a 73 y.o. male presenting with dysarthria and left facial weakness post aorto-bifemoral bypass graft. 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 Etiology unclear. No documented atrial fibrillation post op.  On ASA 325mg  prior to admission. Now on ASA 300mg  PR for secondary stroke prevention. Work up underway.   Hyptertension Hyperlipidemia, LDL 45, on lipitor 40 mg daily, now on no statin, at goal LDL < 70 for diabetics Diabetes, HgbA1c 6.5, goal < 7.0  CAD  PVD - S/p aorto-bifemoral bypass graft on 11/22/2013  Hx DVT  Cigarette smoker  Hospital day # 4  TREATMENT/PLAN  Continue ASA 300mg  PR  Rehab eval in place for transfer to CIR  Resume statin prior to discharge  Wait until NG removed and tolerated, then will do TEE to look for embolic source If TEE negative, a will have cardiology consult and place implantable loop recorder to evaluate for atrial fibrillation as etiology of stroke. Timing of loop placement per cardiology.  Burnetta Sabin, MSN, RN, ANVP-BC, ANP-BC, [facility] Pager: [phone] 11/26/2013 9:21 AM  I have personally obtained a history, examined the patient, evaluated imaging results, and formulated the assessment and plan of care. I agree with the above.  Antony Contras, MD

## 2013-11-26 NOTE — Progress Notes (Addendum)
I agree with Samantha's note.  I have seen and examined the patient.  His NG was removed today after + flatus and +stool.  He continues to drink a lot of water.  He denies nausea.  He ambulated 363feet today  PE:  Abd soft and non-distended Incisions clean Feet:  Warm Neuro:  Still with left facial droop and slurred speech.  No apparent swallowing difficulties  Plan: Will be NPO after midnight for TEE. Can start clear diet after TEE.  Hypernatremia:  Will monitor for now.  Na 148  Son extensively updated at bedside

## 2013-11-26 NOTE — Progress Notes (Signed)
PULMONARY  / CRITICAL CARE MEDICINE CONSULTATION  Name: Perry Kennedy MRN: [id number] DOB: [birth year]    ADMISSION DATE:  11/22/2013  CHIEF COMPLAINT:  S/p aortobifemoral bypass grafting, consult for medical management  BRIEF PATIENT DESCRIPTION: 73 yo active smoker with COPD, DM, PVD, DVT s/p aorta-bifemoral bypass graft. PCCM consulted for medical management.  SIGNIFICANT EVENTS / STUDIES:  1/2   OR >>> S/p aorto-bifem, SMA, IMA bypasses (Dr. Trula Slade) 1/3   Head CT >>> No acute intracranial abnormality. Focal calcification within the posterior inferior right globe, of uncertain etiology. 1/4   Brain MRI >> Acute right frontal lobe infarct with a small amount of petechial hemorrhage. No evidence of major intracranial arterial occlusion or high-grade stenosis.  LINES / TUBES: Foley 1/2 >>>  CULTURES:  ANTIBIOTICS: Cipro 1/2 >>> 1/5 Flagyl 1/2 >>> 1/5  INTERVAL HISTORY:  Now off oxygen.  VITAL SIGNS: Temp:  [97.4 F (36.3 C)-98.5 F (36.9 C)] 97.4 F (36.3 C) (01/06 1138) Pulse Rate:  [78-100] 86 (01/06 1300) Resp:  [6-22] 14 (01/06 1300) BP: (125-158)/(64-108) 147/83 mmHg (01/06 1300) SpO2:  [93 %-100 %] 95 % (01/06 1300) Weight:  [67 kg (147 lb 11.3 oz)] 67 kg (147 lb 11.3 oz) (01/06 0400)  HEMODYNAMICS:   VENTILATOR SETTINGS:    INTAKE / OUTPUT: Intake/Output     01/05 0701 - 01/06 0700 01/06 0701 - 01/07 0700   I.V. (mL/kg) 3000 (44.8) 750 (11.2)   NG/GT 160    IV Piggyback     Total Intake(mL/kg) 3160 (47.2) 750 (11.2)   Urine (mL/kg/hr) 725 (0.5) 150 (0.3)   Emesis/NG output 1095 (0.7) 250 (0.5)   Total Output 1820 400   Net +1340 +350          PHYSICAL EXAMINATION: General:  No acute distress Neuro:  Some dysarthria, otherwise non focal HEENT:  PERRL Neck:  No JVD Cardiovascular:  Regular, no murmurs Lungs:  CTAB Abdomen:  Soft, non tender Musculoskeletal:  Moves all extremities, no edema Skin:  Intact  CBC  Recent Labs Lab 11/24/13 0340  11/25/13 0737 11/26/13 0355  WBC 13.2* 11.2* 6.5  HGB 9.7* 9.3* 8.4*  HCT 27.7* 27.8* 24.9*  PLT 165 167 152   Coag's  Recent Labs Lab 11/22/13 1608 11/22/13 1853 11/24/13 0802  APTT >200* 28 32  INR 1.39 1.48 1.20   BMET  Recent Labs Lab 11/24/13 0340 11/25/13 0737 11/26/13 0355  NA 140 144 148*  K 4.5 4.7 3.7  CL 108 107 110  CO2 21 24 23   BUN 19 16 27*  CREATININE 1.00 0.82 0.84  GLUCOSE 113* 158* 126*   Electrolytes  Recent Labs Lab 11/22/13 1853 11/23/13 0410  11/24/13 0340 11/25/13 0737 11/26/13 0355  CALCIUM 7.4* 7.8*  < > 7.8* 8.5 8.2*  MG 1.1* 2.7*  --   --   --   --   PHOS  --  4.8*  --   --   --   --   < > = values in this interval not displayed. Sepsis Markers No results found for this basename: LATICACIDVEN, PROCALCITON, O2SATVEN,  in the last 168 hours ABG  Recent Labs Lab 11/22/13 1629 11/22/13 1853  PHART 7.365 7.278*  PCO2ART 41.0 44.0  PO2ART 152.0* 78.7*   Liver Enzymes  Recent Labs Lab 11/23/13 0410  AST 18  ALT 29  ALKPHOS 42  BILITOT 0.4  ALBUMIN 2.8*   Cardiac Enzymes  Recent Labs Lab 11/23/13 0410  PROBNP 237.3*  Glucose  Recent Labs Lab 11/25/13 1511 11/25/13 2006 11/26/13 0012 11/26/13 0420 11/26/13 0713 11/26/13 1134  GLUCAP 151* 106* 110* 127* 114* 96   CXR: None today  ASSESSMENT / PLAN:  COPD by history, no Rx preadmission.  -->  Goal SpO2>92 -->  Supplemental oxygen PRN -->  Albuterol PRN  DM. Hyperglycemia. -->  SSI -->  Lantus 15 -->  Hold Metformin  Anemia, likely of acute blood loss. -->  Trend CBC  Bowel angina, s/p vascular bypasses -->  Vascular following -->  Diet -->  Abx d/c'd  Acute CVA. -->  Neurology Following -->  ASA  Px. --> GI is not required --> Lovenox for VTE Px  PCCM will sign off. Please re consult if necessary.  I have personally obtained history, examined patient, evaluated and interpreted laboratory and imaging results, reviewed medical  records, formulated assessment / plan and placed orders.  Doree Fudge, MD [facility] Pager: [phone]  11/26/2013, 2:39 PM

## 2013-11-26 NOTE — Progress Notes (Signed)
[facility] Progress Note  CC:  "I want this tube out  CV:   478'G-956'O systolic  HR 13'Y-865'H-QIONGEX  Gtts:  None  Filed Vitals:   11/26/13 0714  BP:   Pulse:   Temp: 98.1 F (36.7 C)  Resp:     LUNGS: Extubated CTAB chest X-ray: none today  NEURO:   Left facial droop and slurred speech continue Bilateral hand grips are equal.  Bilateral lower extremity, strength is equal.  RENAL:  Intake/Output Summary (Last 24 hours) at 11/26/13 0733 Last data filed at 11/26/13 0700  Gross per 24 hour  Intake   3160 ml  Output   1820 ml  Net   1340 ml   No data found.  BMET    Component Value Date/Time   NA 148* 11/26/2013 0355   NA 135 10/23/2013 1527   K 3.7 11/26/2013 0355   CL 110 11/26/2013 0355   CO2 23 11/26/2013 0355   GLUCOSE 126* 11/26/2013 0355   GLUCOSE 123* 10/23/2013 1527   BUN 27* 11/26/2013 0355   BUN 11 10/23/2013 1527   CREATININE 0.84 11/26/2013 0355   CALCIUM 8.2* 11/26/2013 0355   GFRNONAA 86* 11/26/2013 0355   GFRAA >90 11/26/2013 0355     HEME/ID:   afebrile . CBC    Component Value Date/Time   WBC 6.5 11/26/2013 0355   WBC 10.0 10/23/2013 1527   WBC 13.8* 10/03/2013 1632   RBC 2.83* 11/26/2013 0355   RBC 5.01 10/23/2013 1527   RBC 5.7 10/03/2013 1632   HGB 8.4* 11/26/2013 0355   HGB 15.6 10/03/2013 1632   HCT 24.9* 11/26/2013 0355   HCT 47.4 10/03/2013 1632   PLT 152 11/26/2013 0355   MCV 88.0 11/26/2013 0355   MCV 83.3 10/03/2013 1632   MCH 29.7 11/26/2013 0355   MCH 29.1 10/23/2013 1527   MCH 27.5 10/03/2013 1632   MCHC 33.7 11/26/2013 0355   MCHC 35.0 10/23/2013 1527   MCHC 33.0 10/03/2013 1632   RDW 15.3 11/26/2013 0355   RDW 15.1 10/23/2013 1527   LYMPHSABS 1.7 10/23/2013 1527   LYMPHSABS 1.3 10/08/2013 1047   MONOABS 0.4 10/08/2013 1047   EOSABS 0.3 10/23/2013 1527   EOSABS 0.3 10/08/2013 1047   BASOSABS 0.0 10/23/2013 1527   BASOSABS 0.0 10/08/2013 1047     ABDOMEN: Soft NT/ND; -BM; -flatus; +BM Well healing incision  midlight  EXTREMITIES: bialteral feet are warm;  + doppler signals bilaterally in the DP/PT Bilateral groin incisions are c/d/i   A/P: 73 y.o. male who is s/p  #1: Aortobifemoral bypass graft with 14 x 7 dacryon graft  #2: Extensive right common femoral and profunda femoral endarterectomy  #3: Aorta to superior mesenteric artery bypass graft with 7 mm dacryon  #4: Reimplantation of the inferior mesenteric artery  POD 4  -pt has had 1L out of NGT past 24 hrs-however, RN states that he has been sneaking water from the swab cup and drinking it, so it is unclear how much NGT output there really is.  He denies flatus and has not had a BM. -will give dulcolox this am. -pt receiving Toradol-BUN up slightly; creatinine is stable -acute post operative CVA-appreciate neurology input. -acute surgical blood loss anemia-continues to trend downward today.  He is hemodynamically stable.  Will recheck in the am-if continues to trend downward, will need PRBC's given. -continue to mobilize pt-per PT/OT, pt is not interested in CIR at this time and wants to go home. -continue  IS 10x/hr -he is 9L+ since admission-may need gentle dose of lasix   Leontine Locket, PA-C [facility] [phone]

## 2013-11-26 NOTE — Plan of Care (Signed)
Problem: Phase II Progression Outcomes Goal: Discharge plan established Home with family.  Questionable Inpatient rehab.

## 2013-11-26 NOTE — Progress Notes (Signed)
Perry Kennedy is upset this morning because we will not allow him to drink water. I have explained to him numerous times that he is going to have a swallow screen today, and possibly removing the NGT if the MD says we can. I had been allowing him to swab his mouth out with cold water, until I found him drinking that water from the cup and swab. I have been allowing him swabs with biotine wash and provided biotine gel for his gums. He still demands water and is upset with any explanation I have given him as to why the answer is no. He states he wants to be allowed to go home. I told him that we are trying to get him well enough to go home; he remains unappeased. Richarda Blade RN

## 2013-11-27 ENCOUNTER — Encounter (HOSPITAL_COMMUNITY): Payer: Self-pay | Admitting: Surgery

## 2013-11-27 DIAGNOSIS — I700 Atherosclerosis of aorta: Secondary | ICD-10-CM

## 2013-11-27 DIAGNOSIS — Z01810 Encounter for preprocedural cardiovascular examination: Secondary | ICD-10-CM

## 2013-11-27 LAB — BASIC METABOLIC PANEL
BUN: 27 mg/dL — AB (ref 6–23)
CHLORIDE: 109 meq/L (ref 96–112)
CO2: 27 mEq/L (ref 19–32)
Calcium: 7.9 mg/dL — ABNORMAL LOW (ref 8.4–10.5)
Creatinine, Ser: 0.83 mg/dL (ref 0.50–1.35)
GFR calc Af Amer: >90 mL/min (ref >90)
GFR calc non Af Amer: 86 mL/min — ABNORMAL LOW (ref >90)
GLUCOSE: 109 mg/dL — AB (ref 70–99)
Potassium: 3.4 mEq/L — ABNORMAL LOW (ref 3.7–5.3)
Sodium: 145 mEq/L (ref 137–147)

## 2013-11-27 LAB — GLUCOSE, CAPILLARY
GLUCOSE-CAPILLARY: 100 mg/dL — AB (ref 70–99)
GLUCOSE-CAPILLARY: 116 mg/dL — AB (ref 70–99)
GLUCOSE-CAPILLARY: 120 mg/dL — AB (ref 70–99)
GLUCOSE-CAPILLARY: 125 mg/dL — AB (ref 70–99)
Glucose-Capillary: 183 mg/dL — ABNORMAL HIGH (ref 70–99)
Glucose-Capillary: 115 mg/dL — ABNORMAL HIGH (ref 70–99)
Glucose-Capillary: 99 mg/dL (ref 70–99)
Glucose-Capillary: 92 mg/dL (ref 70–99)

## 2013-11-27 LAB — CBC
HEMATOCRIT: 24.5 % — AB (ref 39.0–52.0)
HEMOGLOBIN: 8.4 g/dL — AB (ref 13.0–17.0)
MCH: 30 pg (ref 26.0–34.0)
MCHC: 34.3 g/dL (ref 30.0–36.0)
MCV: 87.5 fL (ref 78.0–100.0)
Platelets: 143 10*3/uL — ABNORMAL LOW (ref 150–400)
RBC: 2.8 MIL/uL — ABNORMAL LOW (ref 4.22–5.81)
RDW: 15.1 % (ref 11.5–15.5)
WBC: 5.6 10*3/uL (ref 4.0–10.5)

## 2013-11-27 MED ORDER — MIDAZOLAM HCL 2 MG/2ML IJ SOLN
INTRAMUSCULAR | Status: AC
  Filled 2013-11-27: qty 2

## 2013-11-27 MED ORDER — HEPARIN BOLUS VIA INFUSION
4000.0000 [IU] | Freq: Once | INTRAVENOUS | Status: AC
  Administered 2013-11-27: 4000 [IU] via INTRAVENOUS
  Filled 2013-11-27: qty 4000

## 2013-11-27 MED ORDER — HEPARIN (PORCINE) IN NACL 100-0.45 UNIT/ML-% IJ SOLN
1350.0000 [IU]/h | INTRAMUSCULAR | Status: DC
  Administered 2013-11-27: 1100 [IU]/h via INTRAVENOUS
  Administered 2013-11-28 (×2): 1300 [IU]/h via INTRAVENOUS
  Administered 2013-11-29: 1400 [IU]/h via INTRAVENOUS
  Administered 2013-11-29: 1350 [IU]/h via INTRAVENOUS
  Filled 2013-11-27 (×5): qty 250

## 2013-11-27 MED ORDER — FENTANYL CITRATE 0.05 MG/ML IJ SOLN
INTRAMUSCULAR | Status: AC
  Administered 2013-11-27: 25 ug
  Filled 2013-11-27: qty 2

## 2013-11-27 MED ORDER — FENTANYL CITRATE 0.05 MG/ML IJ SOLN
INTRAMUSCULAR | Status: AC
  Filled 2013-11-27: qty 2

## 2013-11-27 MED ORDER — POTASSIUM CHLORIDE 10 MEQ/100ML IV SOLN
10.0000 meq | INTRAVENOUS | Status: AC
  Administered 2013-11-27 (×4): 10 meq via INTRAVENOUS
  Filled 2013-11-27 (×4): qty 100

## 2013-11-27 MED ORDER — MIDAZOLAM HCL 2 MG/2ML IJ SOLN
INTRAMUSCULAR | Status: AC
  Administered 2013-11-27: 1 mg
  Filled 2013-11-27: qty 4

## 2013-11-27 MED ORDER — SODIUM CHLORIDE 0.9 % IV SOLN: INTRAVENOUS | Status: DC

## 2013-11-27 NOTE — Progress Notes (Signed)
  Echocardiogram Echocardiogram Transesophageal has been performed.  Philipp Deputy 11/27/2013, 12:59 PM

## 2013-11-27 NOTE — Progress Notes (Signed)
Have been following patient at a distance--note that patient's balance is improving and he walked 350 feet with RW today Home health therapies continue to be recommended. Will sign off.

## 2013-11-27 NOTE — Progress Notes (Signed)
Physical Therapy Treatment Patient Details Name: Perry Kennedy MRN: [id number] DOB: [birth year] Today's Date: 11/27/2013 Time: 7482-7078 PT Time Calculation (min): 23 min  PT Assessment / Plan / Recommendation  History of Present Illness s/p extensive aorta-bifemoral bypass graft. Post-op (and post-PT eval) Rt frontal CVA   PT Comments   Pt progressing well. Remains slightly unsteady, however may be able to attempt ambulation without RW next visit. Pt hopeful not to need a device for walking on d/c.   Follow Up Recommendations  Home health PT;Supervision - Intermittent     Does the patient have the potential to tolerate intense rehabilitation     Barriers to Discharge        Equipment Recommendations  Other (comment) (TBA ?RW)    Recommendations for Other Services    Frequency Min 3X/week   Progress towards PT Goals Progress towards PT goals: Progressing toward goals  Plan Current plan remains appropriate    Precautions / Restrictions Precautions Precautions: Fall   Pertinent Vitals/Pain SaO2 96% on RA throughout; HR 74-84 Pain in Lt upper arm due to IV problems; RN notified    Mobility  Ambulation/Gait Ambulation Distance (Feet): 350 Feet General Gait Details: with RW; verbal cues for safe hand placement and safe use    Exercises     PT Diagnosis:    PT Problem List:   PT Treatment Interventions:     PT Goals (current goals can now be found in the care plan section)    Visit Information  Last PT Received On: 11/27/13 Assistance Needed: +1 History of Present Illness: s/p extensive aorta-bifemoral bypass graft. Post-op (and post-PT eval) Rt frontal CVA    Subjective Data  Subjective: reports he thinks his face is "drawing more today" Denies feeling weakness in extremities   Cognition  Cognition Arousal/Alertness: Awake/alert Behavior During Therapy: WFL for tasks assessed/performed Overall Cognitive Status: Within Functional Limits for tasks assessed     Balance  Static Standing Balance Rhomberg - Eyes Opened:  (hesitant/unable to obtain position)  End of Session PT - End of Session Equipment Utilized During Treatment: Gait belt Activity Tolerance: Patient tolerated treatment well Patient left: in bed;with call bell/phone within reach;with nursing/sitter in room Nurse Communication: Mobility status;Patient requests pain meds   GP     Perry Kennedy 11/27/2013, 10:16 AM Pager [phone]

## 2013-11-27 NOTE — Progress Notes (Signed)
ANTICOAGULATION CONSULT NOTE - Initial Consult  Pharmacy Consult for heparin Indication: DVT  Allergies  Allergen Reactions  . Morphine And Related Other (See Comments)    "goes crazy"    Patient Measurements: Height: 5\' 6"  (167.6 cm) Weight: 150 lb 2.1 oz (68.1 kg) IBW/kg (Calculated) : 63.8 Heparin Dosing Weight: 68.1 kg   Vital Signs: Temp: 98.1 F (36.7 C) (01/07 1614) Temp src: Oral (01/07 1614) BP: 138/67 mmHg (01/07 1700) Pulse Rate: 82 (01/07 1700)  Labs:  Recent Labs  11/25/13 0737 11/26/13 0355 11/27/13 0215  HGB 9.3* 8.4* 8.4*  HCT 27.8* 24.9* 24.5*  PLT 167 152 143*  CREATININE 0.82 0.84 0.83    Estimated Creatinine Clearance: 73.7 ml/min (by C-G formula based on Cr of 0.83).   Medical History: Past Medical History  Diagnosis Date  . Hyperlipidemia   . GERD (gastroesophageal reflux disease)   . Hypertension   . COPD (chronic obstructive pulmonary disease)   . Insomnia   . Gout   . DVT (deep venous thrombosis)   . Peripheral vascular disease   . Diabetes mellitus without complication   . Headache(784.0)     migranes- not in a few years  . Coronary artery disease     Medications:  Prescriptions prior to admission  Medication Sig Dispense Refill  . aspirin 325 MG tablet Take 325 mg by mouth daily.      Marland Kitchen atorvastatin (LIPITOR) 40 MG tablet Take 1 tablet (40 mg total) by mouth daily.  30 tablet  2  . cilostazol (PLETAL) 100 MG tablet Take 1 tablet (100 mg total) by mouth 2 (two) times daily.  60 tablet  2  . ciprofloxacin (CIPRO) 500 MG tablet Take 500 mg by mouth 2 (two) times daily.      . diclofenac (VOLTAREN) 75 MG EC tablet Take 1 tablet (75 mg total) by mouth 2 (two) times daily.  60 tablet  3  . lisinopril (PRINIVIL,ZESTRIL) 20 MG tablet Take 1 tablet (20 mg total) by mouth daily.  30 tablet  2  . metFORMIN (GLUCOPHAGE) 500 MG tablet Take by mouth 2 (two) times daily with a meal.      . metroNIDAZOLE (FLAGYL) 500 MG tablet Take 500 mg  by mouth 2 (two) times daily.      Marland Kitchen omeprazole (PRILOSEC) 20 MG capsule Take 1 capsule (20 mg total) by mouth daily.  30 capsule  5  . oxyCODONE-acetaminophen (PERCOCET) 5-325 MG per tablet Take by mouth every 4 (four) hours as needed for severe pain.      . ranitidine (ZANTAC) 150 MG tablet Take 1 tablet (150 mg total) by mouth 2 (two) times daily.  60 tablet  5    Assessment: Pt with h/o peripheral vascular disease, DVT, DM, now with acute left leg DVT. Anemia is noted, plts: 143, SCr stable.  Will start heparin and continue to monitor.   Goal of Therapy:  Heparin level 0.3-0.7 units/ml Monitor platelets by anticoagulation protocol: Yes   Plan: Heparin bolus 4000 units x1 Heparin drip 1100 units/hr Daily HL Daily CBC Will check HL in 6 hours  Hughes Better, PharmD, BCPS Clinical Pharmacist 11/27/2013 5:57 PM

## 2013-11-27 NOTE — Progress Notes (Addendum)
*  Preliminary Results* Bilateral lower extremity venous duplex completed. The right lower extremity is negative for deep vein thrombosis. The left lower extremity is positive for isolated, acute, nonocclusive deep vein thrombosis involving the left common femoral vein. There is no evidence of Baker's cyst bilaterally.  Due to extensive plaque morphology of the right lower extremity arteries, the distal right posterior tibial artery was interrogated and exhibited monophasic flow.  Preliminary results discussed with Dr.Early, and Silva Bandy, RN.  11/27/2013  Maudry Mayhew, RVT, RDCS, RDMS

## 2013-11-27 NOTE — Progress Notes (Signed)
[facility] Progress Note  11/27/2013 9:10 AM POD 5  Subjective:  "my arm hurts" states his feet used to be cold, but feeling warmer since surgery.  Denies N/V  Afebrile 315'Q-008'Q systolic HR 76'P-95'K regular 97% RA  Filed Vitals:   11/27/13 0800  BP: 158/76  Pulse: 75  Temp:   Resp: 12    Physical Exam: Cardiac:  regular Lungs:  Non labored Abdomen:  Soft NT/ND +flatus +BM last pm Incisions:  Bilateral groins are clean and dry; laparotomy incision is c/d/i Extremities:  + doppler signals in bilateral DP/PT; infiltrate LUE at previous IV site; no LUE swelling; palpable left radial pulse.  CBC    Component Value Date/Time   WBC 5.6 11/27/2013 0215   WBC 10.0 10/23/2013 1527   WBC 13.8* 10/03/2013 1632   RBC 2.80* 11/27/2013 0215   RBC 5.01 10/23/2013 1527   RBC 5.7 10/03/2013 1632   HGB 8.4* 11/27/2013 0215   HGB 15.6 10/03/2013 1632   HCT 24.5* 11/27/2013 0215   HCT 47.4 10/03/2013 1632   PLT 143* 11/27/2013 0215   MCV 87.5 11/27/2013 0215   MCV 83.3 10/03/2013 1632   MCH 30.0 11/27/2013 0215   MCH 29.1 10/23/2013 1527   MCH 27.5 10/03/2013 1632   MCHC 34.3 11/27/2013 0215   MCHC 35.0 10/23/2013 1527   MCHC 33.0 10/03/2013 1632   RDW 15.1 11/27/2013 0215   RDW 15.1 10/23/2013 1527   LYMPHSABS 1.7 10/23/2013 1527   LYMPHSABS 1.3 10/08/2013 1047   MONOABS 0.4 10/08/2013 1047   EOSABS 0.3 10/23/2013 1527   EOSABS 0.3 10/08/2013 1047   BASOSABS 0.0 10/23/2013 1527   BASOSABS 0.0 10/08/2013 1047    BMET    Component Value Date/Time   NA 145 11/27/2013 0215   NA 135 10/23/2013 1527   K 3.4* 11/27/2013 0215   CL 109 11/27/2013 0215   CO2 27 11/27/2013 0215   GLUCOSE 109* 11/27/2013 0215   GLUCOSE 123* 10/23/2013 1527   BUN 27* 11/27/2013 0215   BUN 11 10/23/2013 1527   CREATININE 0.83 11/27/2013 0215   CALCIUM 7.9* 11/27/2013 0215   GFRNONAA 86* 11/27/2013 0215   GFRAA >90 11/27/2013 0215    INR    Component Value Date/Time   INR 1.20 11/24/2013 0802      Intake/Output Summary (Last 24 hours) at 11/27/13 0910 Last data filed at 11/27/13 0800  Gross per 24 hour  Intake 2153.75 ml  Output   1390 ml  Net 763.75 ml     Assessment/Plan:  73 y.o. male is s/p  #1: Aortobifemoral bypass graft with 14 x 7 dacryon graft  #2: Extensive right common femoral and profunda femoral endarterectomy  #3: Aorta to superior mesenteric artery bypass graft with 7 mm dacryon  #4: Reimplantation of the inferior mesenteric artery   POD 5  -pt for TEE today-supposed to be NPO, but did have water at bedside.  Told pt not to drink anything else as he is supposed to be NPO for TEE.  Spoke with RN to let her know pt is supposed to be strict NPO. -infiltrate LUE from IV - warm compresses to LUE tid -continue to mobilize -clear diet after TEE-pt has +flatus and +BM last pm -possible transfer to stepdown or telemetry later today.   Leontine Locket, PA-C [facility] [phone] 11/27/2013 9:10 AM

## 2013-11-27 NOTE — CV Procedure (Signed)
INDICATIONS: Aortic atherosclerosis. Stroke.  PROCEDURE:   Informed consent was obtained prior to the procedure. The risks, benefits and alternatives for the procedure were discussed and the patient comprehended these risks.  Risks include, but are not limited to, cough, sore throat, vomiting, nausea, somnolence, esophageal and stomach trauma or perforation, bleeding, low blood pressure, aspiration, pneumonia, infection, trauma to the teeth and death.    After a procedural time-out, the oropharynx was anesthetized with 20% benzocaine spray. The patient was given 1 mg versed and 25 mcg fentanyl for moderate sedation.   The transesophageal probe was inserted in the esophagus and stomach without difficulty and multiple views were obtained.  The patient was kept under observation until the patient left the procedure room.  The patient left the procedure room in stable condition.   Agitated microbubble saline contrast was administered.  COMPLICATIONS:    There were no immediate complications.  FINDINGS:  Atrial septal aneurysm with large patent foramen ovale and moderate to severe shunting during quiet breathing. Severe aortic atherosclerosis involving all segments of the thoracic aorta. Normal left ventricular function . Mild aortic valve sclerosis. Normal left atrial size and LA appendage size and function.  RECOMMENDATIONS:    Evaluate for lower extremity DVT as source of paradoxical embolism/stroke.  The severe atherosclerosis in the aortic arch may also have caused the stroke. Unless DVT is identified, it will be hard to distinguish the source of stroke. Discussed with Dr. Leonie Man - LE DVT ordered at his request.  Time Spent Directly with the Patient:  45 minutes   Perry Kennedy 11/27/2013, 12:49 PM

## 2013-11-27 NOTE — Progress Notes (Signed)
Stroke Team Progress Note  HISTORY Perry Kennedy is an 73 y.o. male with a past medical history significant for HTN, hyperlipidemia, DM, CAD, PVD, COPD, gout, s/p extensive aorto-bifemoral bypass graft on 11/22/13, with acute onset of the left face weakness and dysarthria post-op. He was reportedly last known well at 530 pm 1/03 when nursing staff noticed that his speech was garbled, had left face weakness and was less responsive. He has been on a dilaudid PCA since surgery and has been drowsy during the day but able to speak fluently. Urgent CT brain revealed no acute abnormality. Initial NIHSS 5. Patient was not a TPA candidate secondary to recent surgery.  He remained on 2S for further evaluation and treatment.  SUBJECTIVE Patient awake, prepared for TEE.  OBJECTIVE Most recent Vital Signs: Filed Vitals:   11/27/13 0600 11/27/13 0700 11/27/13 0723 11/27/13 0800  BP: 154/73 135/75  158/76  Pulse: 78 75  75  Temp:   97.9 F (36.6 C)   TempSrc:   Oral   Resp: 16 14  12   Height:      Weight: 68.1 kg (150 lb 2.1 oz)     SpO2: 99% 99%  97%   CBG (last 3)   Recent Labs  11/26/13 0420 11/26/13 0713 11/26/13 1134  GLUCAP 127* 114* 96    IV Fluid Intake:   . sodium chloride 20 mL (11/26/13 1845)  . DOPamine    . insulin (NOVOLIN-R) infusion 1.5 Units/hr (11/24/13 0500)  . lactated ringers 20 mL/hr at 11/22/13 0832  . lactated ringers 50 mL/hr at 11/22/13 1119    MEDICATIONS  . aspirin  300 mg Rectal Daily  . docusate  100 mg Oral Daily  . enoxaparin (LOVENOX) injection  30 mg Subcutaneous Q24H  . insulin aspart  1-3 Units Subcutaneous Q4H  . insulin glargine  15 Units Subcutaneous Q24H  . ketorolac  15 mg Intravenous Q6H  . pantoprazole sodium  40 mg Per Tube Daily   PRN:  acetaminophen, acetaminophen, albuterol, alum & mag hydroxide-simeth, bisacodyl, guaiFENesin-dextromethorphan, hydrALAZINE, HYDROmorphone (DILAUDID) injection, influenza vac split quadrivalent PF,  labetalol, labetalol, magnesium sulfate 1 - 4 g bolus IVPB, metoprolol, naLOXone (NARCAN)  injection, phenol, potassium chloride, senna-docusate  Diet:  NPO  Activity:  OOB DVT Prophylaxis:  Lovenox 30 mg sq daily   CLINICALLY SIGNIFICANT STUDIES Basic Metabolic Panel:  Recent Labs Lab 11/22/13 1853 11/23/13 0410  11/26/13 0355 11/27/13 0215  NA 137 138  < > 148* 145  K 5.3 5.4*  < > 3.7 3.4*  CL 105 104  < > 110 109  CO2 19 19  < > 23 27  GLUCOSE 246* 216*  < > 126* 109*  BUN 12 15  < > 27* 27*  CREATININE 0.70 1.06  < > 0.84 0.83  CALCIUM 7.4* 7.8*  < > 8.2* 7.9*  MG 1.1* 2.7*  --   --   --   PHOS  --  4.8*  --   --   --   < > = values in this interval not displayed. Liver Function Tests:   Recent Labs Lab 11/23/13 0410  AST 18  ALT 29  ALKPHOS 42  BILITOT 0.4  PROT 5.1*  ALBUMIN 2.8*   CBC:   Recent Labs Lab 11/26/13 0355 11/27/13 0215  WBC 6.5 5.6  HGB 8.4* 8.4*  HCT 24.9* 24.5*  MCV 88.0 87.5  PLT 152 143*   Coagulation:   Recent Labs Lab 11/22/13 1608 11/22/13 1853  11/24/13 0802  LABPROT 16.7* 17.5* 14.9  INR 1.39 1.48 1.20   Cardiac Enzymes: No results found for this basename: CKTOTAL, CKMB, CKMBINDEX, TROPONINI,  in the last 168 hours Urinalysis:  No results found for this basename: COLORURINE, APPERANCEUR, LABSPEC, PHURINE, GLUCOSEU, HGBUR, BILIRUBINUR, KETONESUR, PROTEINUR, UROBILINOGEN, NITRITE, LEUKOCYTESUR,  in the last 168 hours Lipid Panel    Component Value Date/Time   CHOL 85 11/24/2013 0340   TRIG 117 11/24/2013 0340   HDL 17* 11/24/2013 0340   CHOLHDL 5.0 11/24/2013 0340   VLDL 23 11/24/2013 0340   LDLCALC 45 11/24/2013 0340   HgbA1C  Lab Results  Component Value Date   HGBA1C 6.6* 11/23/2013    Urine Drug Screen:   No results found for this basename: labopia,  cocainscrnur,  labbenz,  amphetmu,  thcu,  labbarb    Alcohol Level: No results found for this basename: ETH,  in the last 168 hours   CT of the brain  11/23/2013    1. No  acute intracranial abnormality. 2. Focal calcification within the posterior inferior right globe, of uncertain etiology.   MRI of the brain  11/24/2013   Acute right frontal lobe infarct with a small amount of petechial hemorrhage.   MRA of the brain  11/24/2013 No evidence of major intracranial arterial occlusion or high-grade stenosis.    2D Echocardiogram  EF 60-65% with no source of embolus.   Carotid Doppler  No evidence of hemodynamically significant internal carotid artery stenosis. Vertebral artery flow is antegrade.   TEE   CXR  11/23/2013 Patchy basilar densities are most compatible with atelectasis.  Evidence for free intraperitoneal air due to recent intra-abdominal surgery.   Therapy Recommendations CIR  Physical Exam   Mental Status:  Alert, oriented, thought content appropriate. Mild dysarthria without evidence of aphasia. Able to follow 3 step commands without difficulty.  Cranial Nerves:  II: Pupils equal, round, reactive to light III,IV, VI: ptosis not present, extra-ocular motions intact bilaterally  V,VII: smile asymmetric with left face weakness, facial light touch sensation normal bilaterally  VIII: hearing normal bilaterally  IX,X: gag reflex present  XI: bilateral shoulder shrug  XII: midline tongue extension without atrophy or fasciculations  Motor:  Right : Upper extremity 5/5 Left: Upper extremity 5/5  Lower extremity 5/5 Lower extremity 5/5  Tone and bulk:normal tone throughout; no atrophy noted  Sensory: Pinprick and light touch intact throughout, bilaterally  Deep Tendon Reflexes:  Right: Upper Extremity Left: Upper extremity  biceps (C-5 to C-6) 2/4 biceps (C-5 to C-6) 2/4  tricep (C7) 2/4 triceps (C7) 2/4  Brachioradialis (C6) 2/4 Brachioradialis (C6) 2/4  Lower Extremity Lower Extremity  quadriceps (L-2 to L-4) 2/4 quadriceps (L-2 to L-4) 2/4  Achilles (S1) 2/4 Achilles (S1) 2/4  Plantars:  Right: downgoing Left: downgoing  Cerebellar:  normal  finger-to-nose, normal heel-to-shin test   ASSESSMENT [name] is a 73 y.o. male presenting with dysarthria and left facial weakness post aorto-bifemoral bypass graft. 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 Etiology unclear. No documented atrial fibrillation post op.  On ASA 325mg  prior to admission. Now on ASA 300mg  PR for secondary stroke prevention. Work up underway.   Dysphagia. Passed swallow eval this am. Ok for clear liquids.  Hyptertension Hyperlipidemia, LDL 45, on lipitor 40 mg daily, now on no statin, at goal LDL < 70 for diabetics Diabetes, HgbA1c 6.5, goal < 7.0  CAD  PVD - S/p aorto-bifemoral bypass graft on 11/22/2013  Hx DVT  Cigarette smoker  Hospital day # 5  TREATMENT/PLAN  Continue ASA 300mg  PR. TEE to look for embolic source. Arranged with [place] for today in ICU. If positive for PFO (patent foramen ovale), check bilateral lower extremity venous dopplers to rule out DVT as possible source of stroke. Discussed with RN. If TEE negative,  will have cardiology consult and place implantable loop recorder to evaluate for atrial fibrillation as etiology of stroke. Timing of loop placement per cardiology.  Rehab eval in place for transfer to CIR  Resume statin prior to discharge  Burnetta Sabin, MSN, RN, ANVP-BC, ANP-BC, GNP-BC [facility] Pager: [phone] 11/27/2013 10:15 AM  I have personally obtained a history, examined the patient, evaluated imaging results, and formulated the assessment and plan of care. I agree with the above. Antony Contras, MD

## 2013-11-27 NOTE — Progress Notes (Signed)
Patient ID: Perry Kennedy, male   DOB: [birth year], 73 y.o.   MRN: [id number] The patient underwent TEE today which showed large patent foramen ovale. He then underwent lower extremity venous duplex to rule out DVT as a source of the potential paradoxical embolus and stroke. His duplex was positive for nonocclusive thrombus in his left common femoral vein.  On physical exam his left groin incision is healing nicely and he does not have any calf tenderness. He does have sequential compression devices intact in both lower extremities.  I discussed this with the patient and his family present. We'll begin IV heparin tonight and explained all will need for intermediate term anticoagulation

## 2013-11-28 LAB — CBC
HCT: 26.3 % — ABNORMAL LOW (ref 39.0–52.0)
HEMOGLOBIN: 9.2 g/dL — AB (ref 13.0–17.0)
MCH: 29.9 pg (ref 26.0–34.0)
MCHC: 35 g/dL (ref 30.0–36.0)
MCV: 85.4 fL (ref 78.0–100.0)
Platelets: 136 10*3/uL — ABNORMAL LOW (ref 150–400)
RBC: 3.08 MIL/uL — AB (ref 4.22–5.81)
RDW: 14.8 % (ref 11.5–15.5)
WBC: 6.5 10*3/uL (ref 4.0–10.5)

## 2013-11-28 LAB — GLUCOSE, CAPILLARY
GLUCOSE-CAPILLARY: 159 mg/dL — AB (ref 70–99)
Glucose-Capillary: 123 mg/dL — ABNORMAL HIGH (ref 70–99)
Glucose-Capillary: 111 mg/dL — ABNORMAL HIGH (ref 70–99)
Glucose-Capillary: 130 mg/dL — ABNORMAL HIGH (ref 70–99)

## 2013-11-28 LAB — HEPARIN LEVEL (UNFRACTIONATED)
HEPARIN UNFRACTIONATED: 0.19 [IU]/mL — AB (ref 0.30–0.70)
Heparin Unfractionated: 0.43 IU/mL (ref 0.30–0.70)
Heparin Unfractionated: 0.37 IU/mL (ref 0.30–0.70)

## 2013-11-28 MED ORDER — ASPIRIN 325 MG PO TABS
325.0000 mg | ORAL_TABLET | Freq: Every day | ORAL | Status: DC
  Administered 2013-11-28 – 2013-12-04 (×7): 325 mg via ORAL
  Filled 2013-11-28 (×8): qty 1

## 2013-11-28 MED ORDER — ATORVASTATIN CALCIUM 40 MG PO TABS
40.0000 mg | ORAL_TABLET | Freq: Every day | ORAL | Status: DC
  Administered 2013-11-28 – 2013-12-04 (×7): 40 mg via ORAL
  Filled 2013-11-28 (×8): qty 1

## 2013-11-28 MED ORDER — HEPARIN BOLUS VIA INFUSION
2000.0000 [IU] | Freq: Once | INTRAVENOUS | Status: AC
  Administered 2013-11-28: 2000 [IU] via INTRAVENOUS
  Filled 2013-11-28: qty 2000

## 2013-11-28 MED ORDER — OXYCODONE HCL 5 MG PO TABS
5.0000 mg | ORAL_TABLET | ORAL | Status: DC | PRN
  Administered 2013-11-28 – 2013-12-04 (×14): 5 mg via ORAL
  Filled 2013-11-28 (×14): qty 1

## 2013-11-28 NOTE — Progress Notes (Signed)
Dr Trula Slade and Chyrl Civatte called to follow up on whether to keep on heparin infusion .orders to continue infusion.

## 2013-11-28 NOTE — Progress Notes (Signed)
ANTICOAGULATION CONSULT NOTE - Follow-up Pharmacy Consult for heparin Indication: DVT  Allergies  Allergen Reactions  . Morphine And Related Other (See Comments)    "goes crazy"    Patient Measurements: Height: 5\' 6"  (167.6 cm) Weight: 149 lb 14.6 oz (68 kg) IBW/kg (Calculated) : 63.8 Heparin Dosing Weight: 68.1 kg   Vital Signs: Temp: 98.5 F (36.9 C) (01/08 1145) Temp src: Oral (01/08 1145) BP: 144/69 mmHg (01/08 1400) Pulse Rate: 98 (01/08 1500)  Labs:  Recent Labs  11/26/13 0355 11/27/13 0215 11/28/13 0128 11/28/13 0820 11/28/13 1405  HGB 8.4* 8.4*  --  9.2*  --   HCT 24.9* 24.5*  --  26.3*  --   PLT 152 143*  --  136*  --   HEPARINUNFRC  --   --  0.19* 0.43 0.37  CREATININE 0.84 0.83  --   --   --     Estimated Creatinine Clearance: 73.7 ml/min (by C-G formula based on Cr of 0.83).  Assessment: 46 yom continues on IV heparin for new LLE DVT. Of note, pt also had an acute embolic R frontal infarct on 1/3. Will reduce heparin level goal. Heparin level remains therapeutic at 0.37. H/H is low but stable and plts are low and trending down. Will need to monitor closely. Noted neurology's recommendation to start xarelto for oral anticoagulation.   Goal of Therapy:  Heparin level 0.3-0.5 units/ml Monitor platelets by anticoagulation protocol: Yes   Plan: 1. Continue heparin gtt at 1300 units/hr 2. Continue daily heparin level and CBC - watch plts closely 3. F/u possible transition to xarelto. Please order a "Xarelto per Pharmacy" consult if planning to transition to xarelto to aid with dosing  Salome Arnt, PharmD, BCPS Pager # [phone] 11/28/2013 3:47 PM

## 2013-11-28 NOTE — Progress Notes (Signed)
ANTICOAGULATION CONSULT NOTE - Follow-up Pharmacy Consult for heparin Indication: DVT  Allergies  Allergen Reactions  . Morphine And Related Other (See Comments)    "goes crazy"    Patient Measurements: Height: 5\' 6"  (167.6 cm) Weight: 149 lb 14.6 oz (68 kg) IBW/kg (Calculated) : 63.8 Heparin Dosing Weight: 68.1 kg   Vital Signs: Temp: 97.8 F (36.6 C) (01/08 0730) Temp src: Oral (01/08 0730) BP: 160/84 mmHg (01/08 0800) Pulse Rate: 86 (01/08 0800)  Labs:  Recent Labs  11/26/13 0355 11/27/13 0215 11/28/13 0128 11/28/13 0820  HGB 8.4* 8.4*  --  9.2*  HCT 24.9* 24.5*  --  26.3*  PLT 152 143*  --  136*  HEPARINUNFRC  --   --  0.19* 0.43  CREATININE 0.84 0.83  --   --     Estimated Creatinine Clearance: 73.7 ml/min (by C-G formula based on Cr of 0.83).  Assessment: 17 yom continues on IV heparin for new LLE DVT. Of note, pt also had an acute embolic R frontal infarct on 1/3. Will reduce heparin level goal. Heparin level is now therapeutic at 0.43. H/H is low but stable and plts are low and trending down. Will need to monitor closely.  Goal of Therapy:  Heparin level 0.3-0.5 units/ml Monitor platelets by anticoagulation protocol: Yes   Plan: 1. Continue heparin gtt at 1300 units/hr 2. Check a heparin level this afternoon to confirm dosing 3. Continue daily heparin level and CBC - watch plts closely 4. F/u initiation of oral anticoagulation  Salome Arnt, PharmD, BCPS Pager # [phone] 11/28/2013 9:32 AM

## 2013-11-28 NOTE — Progress Notes (Addendum)
[facility]  Subjective  - Depressed.     Objective 142/104 82 98 F (36.7 C) (Oral) 14 97%  Intake/Output Summary (Last 24 hours) at 11/28/13 0726 Last data filed at 11/28/13 0600  Gross per 24 hour  Intake   1180 ml  Output   1276 ml  Net    -96 ml   TEE FINDINGS:  Atrial septal aneurysm with large patent foramen ovale and moderate to severe shunting during quiet breathing.  Severe aortic atherosclerosis involving all segments of the thoracic aorta.  Normal left ventricular function .  Mild aortic valve sclerosis.  Normal left atrial size and LA appendage size and function  LE venous duplex The left lower extremity is positive for isolated, acute, nonocclusive deep vein thrombosis involving the left common femoral vein.   Incisions healing well, no hematoma. Upper and lower extremity grossly 5/5 equal bil. Left facial droop and tongue deviation.   Assessment/Planning: POD # #1: Aortobifemoral bypass graft with 14 x 7 dacryon graft  #2: Extensive right common femoral and profunda femoral endarterectomy  #3: Aorta to superior mesenteric artery bypass graft with 7 mm dacryon  #4: Reimplantation of the inferior mesenteric artery  Pos. BM Purade diet TEE Atrial septal aneurysm with large patent foramen ovale and moderate to severe shunting during quiet breathing.  Positive DVT Left common femoral.  Started on heparin. Patient is ambulating well with stand by assistance and rolling walker Hemoglobin 8.4 stable Asa sup changed to PO 325 asa.   Laurence Slate [facility] 11/28/2013 7:26 AM --  Laboratory Lab Results:  Recent Labs  11/26/13 0355 11/27/13 0215  WBC 6.5 5.6  HGB 8.4* 8.4*  HCT 24.9* 24.5*  PLT 152 143*   BMET  Recent Labs  11/26/13 0355 11/27/13 0215  NA 148* 145  K 3.7 3.4*  CL 110 109  CO2 23 27  GLUCOSE 126* 109*  BUN 27* 27*  CREATININE 0.84 0.83  CALCIUM 8.2* 7.9*    COAG Lab Results   Component Value Date   INR 1.20 11/24/2013   INR 1.48 11/22/2013   INR 1.39 11/22/2013   No results found for this basename: PTT      I have seen and evaluated the patient today.  He is on IV heparin for stroke.  He has been cleared by speech for a dysphagia 1 diet.  He continues to be able to ambulate.  His pain is controlled.  His incisions are healing nicely.  His feet are warm and well perfused.  Annamarie Major

## 2013-11-28 NOTE — Progress Notes (Signed)
ANTICOAGULATION CONSULT NOTE - Follow Up Consult  Pharmacy Consult for heparin Indication: DVT  Labs:  Recent Labs  11/25/13 0737 11/26/13 0355 11/27/13 0215 11/28/13 0128  HGB 9.3* 8.4* 8.4*  --   HCT 27.8* 24.9* 24.5*  --   PLT 167 152 143*  --   HEPARINUNFRC  --   --   --  0.19*  CREATININE 0.82 0.84 0.83  --     Assessment: 72yo male subtherapeutic on heparin with initial dosing for DVT.  Goal of Therapy:  Heparin level 0.3-0.7 units/ml   Plan:  Will rebolus with heparin 2000 units x1 and increase gtt by 3 units/kg/hr to 1300 units/hr and check level in 6hr.  Wynona Neat, PharmD, BCPS  11/28/2013,2:02 AM

## 2013-11-28 NOTE — Progress Notes (Signed)
Stroke Team Progress Note  HISTORY Perry Kennedy is an 73 y.o. male with a past medical history significant for HTN, hyperlipidemia, DM, CAD, PVD, COPD, gout, s/p extensive aorto-bifemoral bypass graft on 11/22/13, with acute onset of the left face weakness and dysarthria post-op. He was reportedly last known well at 530 pm 1/03 when nursing staff noticed that his speech was garbled, had left face weakness and was less responsive. He has been on a dilaudid PCA since surgery and has been drowsy during the day but able to speak fluently. Urgent CT brain revealed no acute abnormality. Initial NIHSS 5. Patient was not a TPA candidate secondary to recent surgery.  He remained on 2S for further evaluation and treatment.  SUBJECTIVE Pt. Lying in bed. Walked around unit this am per Therapist, sports.  OBJECTIVE Most recent Vital Signs: Filed Vitals:   11/28/13 0700 11/28/13 0730 11/28/13 0800 11/28/13 0900  BP: 142/104  160/84   Pulse: 82  86 86  Temp:  97.8 F (36.6 C)    TempSrc:  Oral    Resp: 14  18 18   Height:      Weight:      SpO2: 97%  91% 100%   CBG (last 3)   Recent Labs  11/28/13 0012 11/28/13 0358 11/28/13 0728  GLUCAP 130* 111* 123*    IV Fluid Intake:   . sodium chloride 20 mL (11/26/13 1845)  . sodium chloride 20 mL/hr (11/27/13 1130)  . DOPamine    . heparin 1,300 Units/hr (11/28/13 0746)  . insulin (NOVOLIN-R) infusion 1.5 Units/hr (11/24/13 0500)  . lactated ringers 20 mL/hr at 11/22/13 0832  . lactated ringers 50 mL/hr at 11/22/13 1119    MEDICATIONS  . aspirin  325 mg Oral Daily  . docusate  100 mg Oral Daily  . insulin aspart  1-3 Units Subcutaneous Q4H  . insulin glargine  15 Units Subcutaneous Q24H  . pantoprazole sodium  40 mg Per Tube Daily   PRN:  acetaminophen, acetaminophen, albuterol, alum & mag hydroxide-simeth, bisacodyl, guaiFENesin-dextromethorphan, hydrALAZINE, HYDROmorphone (DILAUDID) injection, influenza vac split quadrivalent PF, labetalol, labetalol,  magnesium sulfate 1 - 4 g bolus IVPB, metoprolol, naLOXone (NARCAN)  injection, oxyCODONE, phenol, potassium chloride, senna-docusate  Diet:  Dysphagia 1 thin liquids Activity:  OOB DVT Prophylaxis:  Lovenox 30 mg sq daily   CLINICALLY SIGNIFICANT STUDIES Basic Metabolic Panel:  Recent Labs Lab 11/22/13 1853 11/23/13 0410  11/26/13 0355 11/27/13 0215  NA 137 138  < > 148* 145  K 5.3 5.4*  < > 3.7 3.4*  CL 105 104  < > 110 109  CO2 19 19  < > 23 27  GLUCOSE 246* 216*  < > 126* 109*  BUN 12 15  < > 27* 27*  CREATININE 0.70 1.06  < > 0.84 0.83  CALCIUM 7.4* 7.8*  < > 8.2* 7.9*  MG 1.1* 2.7*  --   --   --   PHOS  --  4.8*  --   --   --   < > = values in this interval not displayed. Liver Function Tests:   Recent Labs Lab 11/23/13 0410  AST 18  ALT 29  ALKPHOS 42  BILITOT 0.4  PROT 5.1*  ALBUMIN 2.8*   CBC:   Recent Labs Lab 11/27/13 0215 11/28/13 0820  WBC 5.6 6.5  HGB 8.4* 9.2*  HCT 24.5* 26.3*  MCV 87.5 85.4  PLT 143* 136*   Coagulation:   Recent Labs Lab 11/22/13 1608 11/22/13  1853 11/24/13 0802  LABPROT 16.7* 17.5* 14.9  INR 1.39 1.48 1.20   Cardiac Enzymes: No results found for this basename: CKTOTAL, CKMB, CKMBINDEX, TROPONINI,  in the last 168 hours Urinalysis:  No results found for this basename: COLORURINE, APPERANCEUR, LABSPEC, PHURINE, GLUCOSEU, HGBUR, BILIRUBINUR, KETONESUR, PROTEINUR, UROBILINOGEN, NITRITE, LEUKOCYTESUR,  in the last 168 hours Lipid Panel    Component Value Date/Time   CHOL 85 11/24/2013 0340   TRIG 117 11/24/2013 0340   HDL 17* 11/24/2013 0340   CHOLHDL 5.0 11/24/2013 0340   VLDL 23 11/24/2013 0340   LDLCALC 45 11/24/2013 0340   HgbA1C  Lab Results  Component Value Date   HGBA1C 6.6* 11/23/2013    Urine Drug Screen:   No results found for this basename: labopia,  cocainscrnur,  labbenz,  amphetmu,  thcu,  labbarb    Alcohol Level: No results found for this basename: ETH,  in the last 168 hours   CT of the brain  11/23/2013     1. No acute intracranial abnormality. 2. Focal calcification within the posterior inferior right globe, of uncertain etiology.   MRI of the brain  11/24/2013   Acute right frontal lobe infarct with a small amount of petechial hemorrhage.   MRA of the brain  11/24/2013 No evidence of major intracranial arterial occlusion or high-grade stenosis.    2D Echocardiogram  EF 60-65% with no source of embolus.   Carotid Doppler  No evidence of hemodynamically significant internal carotid artery stenosis. Vertebral artery flow is antegrade.   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  Severe aortic atherosclerosis involving all segments of the thoracic aorta. Normal left ventricular function . Mild aortic valve sclerosis. Normal left atrial size and LA appendage size and function.  LE Venous Doppler The right lower extremity is negative for deep vein thrombosis. The left lower extremity is positive for isolated, acute, nonocclusive deep vein thrombosis involving the left common femoral vein. There is no evidence of Baker's cyst bilaterally.  CXR  11/23/2013 Patchy basilar densities are most compatible with atelectasis.  Evidence for free intraperitoneal air due to recent intra-abdominal surgery.   Therapy Recommendations CIR-> home health  Physical Exam   Mental Status:  Alert, oriented, thought content appropriate. Mild dysarthria without evidence of aphasia. Able to follow 3 step commands without difficulty.  Cranial Nerves:  II: Pupils equal, round, reactive to light III,IV, VI: ptosis not present, extra-ocular motions intact bilaterally  V,VII: smile asymmetric with left face weakness, facial light touch sensation normal bilaterally  VIII: hearing normal bilaterally  IX,X: gag reflex present  XI: bilateral shoulder shrug  XII: midline tongue extension without atrophy or fasciculations  Motor:  Right : Upper  extremity 5/5 Left: Upper extremity 5/5  Lower extremity 5/5 Lower extremity 5/5  Tone and bulk:normal tone throughout; no atrophy noted  Sensory: Pinprick and light touch intact throughout, bilaterally  Deep Tendon Reflexes:  Right: Upper Extremity Left: Upper extremity  biceps (C-5 to C-6) 2/4 biceps (C-5 to C-6) 2/4  tricep (C7) 2/4 triceps (C7) 2/4  Brachioradialis (C6) 2/4 Brachioradialis (C6) 2/4  Lower Extremity Lower Extremity  quadriceps (L-2 to L-4) 2/4 quadriceps (L-2 to L-4) 2/4  Achilles (S1) 2/4 Achilles (S1) 2/4  Plantars:  Right: downgoing Left: downgoing  Cerebellar:  normal finger-to-nose, normal heel-to-shin test   ASSESSMENT Mr. Perry Kennedy is a 73 y.o. male presenting with dysarthria and left facial weakness post  aorto-bifemoral bypass graft. 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 TEE confirmed presence of PFO; LE dopplers positive for L femoral clot. Etiology of stroke embolic secondary to DVT in presence of PFO.  On ASA 325mg  prior to admission. Placed on IV heparin with bolus and ASA 325 for secondary stroke prevention.    DVT - L common femoral vein  Dysphagia. Passed swallow eval this am. Ok for clear liquids.  Hyptertension Hyperlipidemia, LDL 45, on lipitor 40 mg daily, now on no statin, at goal LDL < 70 for diabetics Diabetes, HgbA1c 6.5, goal < 7.0  CAD  PVD - S/p aorto-bifemoral bypass graft on 11/22/2013  Hx DVT  Cigarette smoker  Hospital day # 6  TREATMENT/PLAN  Recommend change of  IV heparin to xarelto - can ask pharmacy to help with transition.   Home health or OP PT and ST  Do not recommend PFO closure  [place] for transfer to the floor from stroke standpoint  Resumed statin Ongoing risk factor control by Primary Care Physician Stroke Service will sign off. Please call should any needs arise. Follow up with Dr. Leonie Man, [facility], in 2 months.  Dr. Leonie Man discussed diagnosis, prognosis,  treatment options and  plan of care with pt, RN, and Dr. Trula Slade. He will speak further with DR. Brabham once he is out of OR.     Burnetta Sabin, MSN, RN, ANVP-BC, ANP-BC, [facility] Pager: [phone] 11/28/2013 10:20 AM  I have personally obtained a history, examined the patient, evaluated imaging results, and formulated the assessment and plan of care. I agree with the above.  Antony Contras, MD

## 2013-11-29 ENCOUNTER — Inpatient Hospital Stay (HOSPITAL_COMMUNITY): Payer: Medicare Other

## 2013-11-29 LAB — GLUCOSE, CAPILLARY
GLUCOSE-CAPILLARY: 110 mg/dL — AB (ref 70–99)
GLUCOSE-CAPILLARY: 119 mg/dL — AB (ref 70–99)
GLUCOSE-CAPILLARY: 129 mg/dL — AB (ref 70–99)
GLUCOSE-CAPILLARY: 136 mg/dL — AB (ref 70–99)
GLUCOSE-CAPILLARY: 154 mg/dL — AB (ref 70–99)
Glucose-Capillary: 182 mg/dL — ABNORMAL HIGH (ref 70–99)
Glucose-Capillary: 131 mg/dL — ABNORMAL HIGH (ref 70–99)
Glucose-Capillary: 77 mg/dL (ref 70–99)

## 2013-11-29 LAB — CBC
HCT: 24.3 % — ABNORMAL LOW (ref 39.0–52.0)
HEMOGLOBIN: 8.3 g/dL — AB (ref 13.0–17.0)
MCH: 29.4 pg (ref 26.0–34.0)
MCHC: 34.2 g/dL (ref 30.0–36.0)
MCV: 86.2 fL (ref 78.0–100.0)
Platelets: 158 10*3/uL (ref 150–400)
RBC: 2.82 MIL/uL — ABNORMAL LOW (ref 4.22–5.81)
RDW: 14.8 % (ref 11.5–15.5)
WBC: 7.4 10*3/uL (ref 4.0–10.5)

## 2013-11-29 LAB — HEPARIN LEVEL (UNFRACTIONATED)
HEPARIN UNFRACTIONATED: 0.27 [IU]/mL — AB (ref 0.30–0.70)
Heparin Unfractionated: 0.52 IU/mL (ref 0.30–0.70)

## 2013-11-29 MED ORDER — BOOST / RESOURCE BREEZE PO LIQD: 1.0000 | Freq: Two times a day (BID) | ORAL | Status: DC

## 2013-11-29 MED ORDER — FUROSEMIDE 10 MG/ML IJ SOLN
20.0000 mg | Freq: Once | INTRAMUSCULAR | Status: AC
  Administered 2013-11-29: 20 mg via INTRAVENOUS
  Filled 2013-11-29: qty 2

## 2013-11-29 MED ORDER — BOOST / RESOURCE BREEZE PO LIQD
1.0000 | Freq: Two times a day (BID) | ORAL | Status: DC
  Administered 2013-11-29 – 2013-12-04 (×8): 1 via ORAL

## 2013-11-29 MED ORDER — ATORVASTATIN CALCIUM 40 MG PO TABS: 40.0000 mg | ORAL_TABLET | Freq: Every day | ORAL | Status: DC

## 2013-11-29 MED ORDER — METOCLOPRAMIDE HCL 5 MG/ML IJ SOLN
10.0000 mg | Freq: Four times a day (QID) | INTRAMUSCULAR | Status: DC | PRN
  Filled 2013-11-29: qty 2

## 2013-11-29 NOTE — Progress Notes (Addendum)
[facility]  Subjective  - Patient has had some incontinents issues-penial swelling post surgery.  He states his belly is more painful today than yesterday.   Objective 139/71 87 98.9 F (37.2 C) (Oral) 15 95%  Intake/Output Summary (Last 24 hours) at 11/29/13 0734 Last data filed at 11/29/13 0700  Gross per 24 hour  Intake   1361 ml  Output    850 ml  Net    511 ml   Ambulated with RW to bathroom Penial swelling Incision clean and dry Abdomin increased firm ness and fewer bowel sounds with tenderness to palpation Distal DP/PT doppler signals 2+ Bilateral LE edema Speech clear, continued left sided facial droop    Assessment/Planning: #1: Aortobifemoral bypass graft with 14 x 7 dacryon graft  #2: Extensive right common femoral and profunda femoral endarterectomy  #3: Aorta to superior mesenteric artery bypass graft with 7 mm dacryon  #4: Reimplantation of the inferior mesenteric artery  MRI of the brain 11/24/2013 Acute right frontal lobe infarct with a small amount of petechial hemorrhage.  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  Severe aortic atherosclerosis involving all segments of the thoracic aorta. Normal left ventricular function . Mild aortic valve sclerosis. Normal left atrial size and LA appendage size and function. LE Venous Doppler The right lower extremity is negative for deep vein thrombosis. The left lower extremity is positive for isolated, acute, nonocclusive deep vein thrombosis involving the left common femoral vein. There is no evidence of Baker's cyst bilaterally.  Neurology plan: Recommend change of IV heparin to xarelto - can ask pharmacy to help with transition.  Stroke Service will sign off. Please call should any needs arise. Follow up with Dr. Leonie Man, [facility], in 2 months.  I will discuss above issues and when Dr. Trula Slade  wants to start Xarelto.  Abdomin issues?  Reglan PRN will be ordered.  Laurence Slate [facility] 11/29/2013 7:34 AM --  Laboratory Lab Results:  Recent Labs  11/28/13 0820 11/29/13 0420  WBC 6.5 7.4  HGB 9.2* 8.3*  HCT 26.3* 24.3*  PLT 136* 158   BMET  Recent Labs  11/27/13 0215  NA 145  K 3.4*  CL 109  CO2 27  GLUCOSE 109*  BUN 27*  CREATININE 0.83  CALCIUM 7.9*    COAG Lab Results  Component Value Date   INR 1.20 11/24/2013   INR 1.48 11/22/2013   INR 1.39 11/22/2013   No results found for this basename: PTT      I agree with the above.  I have seen and examined the patient.  He does complain of more abdominal pain today, however his abdomen remained soft and nondistended.  He continues to pass flatus and bowel movements.  He is not having any nausea or vomiting.  He did ambulate earlier today.  He continues to have edema following his operation.  This is likely secondary to poor nutrition preoperatively, in addition to IV fluids during and after surgery.  I would give him another dose of Lasix today.

## 2013-11-29 NOTE — Progress Notes (Signed)
ANTICOAGULATION CONSULT NOTE - Follow Up Consult  Pharmacy Consult for Heparin  Indication: DVT  Allergies  Allergen Reactions  . Morphine And Related Other (See Comments)    "goes crazy"    Patient Measurements: Height: 5\' 6"  (167.6 cm) Weight: 153 lb 3.5 oz (69.5 kg) IBW/kg (Calculated) : 63.8  Vital Signs: Temp: 98.4 F (36.9 C) (01/09 1115) Temp src: Oral (01/09 1115) BP: 147/77 mmHg (01/09 1400) Pulse Rate: 96 (01/09 1400)  Labs:  Recent Labs  11/27/13 0215  11/28/13 0820 11/28/13 1405 11/29/13 0420 11/29/13 1410  HGB 8.4*  --  9.2*  --  8.3*  --   HCT 24.5*  --  26.3*  --  24.3*  --   PLT 143*  --  136*  --  158  --   HEPARINUNFRC  --   < > 0.43 0.37 0.27* 0.52  CREATININE 0.83  --   --   --   --   --   < > = values in this interval not displayed.  Estimated Creatinine Clearance: 73.7 ml/min (by C-G formula based on Cr of 0.83).  Medications:  Heparin 1400 units/hr  Assessment: 73 y/o M on heparin for LLE DVT. Also with acute embolic infarct on 1/3 (requiring lower heparin level goal of 0.3-0.5). Neurology recommends change to Xarelto. HL is now 0.52 which is minimally above goal. No bleeding noted.   Goal of Therapy:  Heparin level 0.3-0.5 units/ml Monitor platelets by anticoagulation protocol: Yes   Plan:  1. Reduce heparin gtt slightly to 1350 units/hr 2. F/u AM heparin level   Salome Arnt, PharmD, BCPS Pager # [phone] 11/29/2013 2:55 PM

## 2013-11-29 NOTE — Progress Notes (Addendum)
[facility]  Patient reported being very tearful and crying uncontrollably at times since having his stroke 11/23/2013 post aorto-bifemoral bypass graft on 11/22/13.  I have called in a behavior health consult for depression.  He has also complained of a head ache for the past hour to the nursing staff.  We will order a new CT of his head since he is on heparin.  Javana Schey MAUREEN PA-C  Repeat CT scan of the head  IMPRESSION:  1. Expected evolution of subacute right frontal cortical infarction.  2. No acute intracranial findings.  No new findings. Claude Swendsen MAUREEN PA-C

## 2013-11-29 NOTE — Progress Notes (Signed)
Occupational Therapy Treatment Patient Details Name: Perry Kennedy MRN: [id number] DOB: [birth year] Today's Date: 11/29/2013 Time: 1062-6948 OT Time Calculation (min): 43 min  OT Assessment / Plan / Recommendation  History of present illness s/p extensive aorta-bifemoral bypass graft. Post-op (and post-PT eval) Rt frontal CVA   OT comments  Pt requires mod A for LB ADLs.  He fatigues quickly with grooming activities while standing at sink.  He requires increased time to process complex info, and requires cues for problem solving and safety.  Pt noted to consistently run into wall and nursing desk on the Lt (occurred x 5).  He made no attempt to maneuver away from obstacles, and rather then moving walker to Rt away from obstacle and continuing on, he rammed the walker into the obstacle repeatedly until he either cleared the obstacle, or was cued by therapist to move to his Rt.   Discussed this with both son and RN as it is a safety issue for home and pt should not drive upon discharge.  Recommend 24 hour supervision at discharge, and [place].   Follow Up Recommendations  Home health OT;Supervision - Intermittent    Barriers to Discharge       Equipment Recommendations  Tub/shower seat    Recommendations for Other Services    Frequency Min 2X/week   Progress towards OT Goals Progress towards OT goals: Progressing toward goals  Plan Discharge plan remains appropriate    Precautions / Restrictions Precautions Precautions: Fall Restrictions Weight Bearing Restrictions: No   Pertinent Vitals/Pain     ADL  Eating/Feeding: Set up Where Assessed - Eating/Feeding: Chair Grooming: Teeth care;Min guard Where Assessed - Grooming: Supported standing Lower Body Bathing: Moderate assistance Where Assessed - Lower Body Bathing: Supported sit to stand Lower Body Dressing: +1 Total assistance Where Assessed - Lower Body Dressing: Supported sit to stand Toilet Transfer: Magazine features editor  Method: Stand pivot Equipment Used: Rolling walker Transfers/Ambulation Related to ADLs: min guard assist and verbal cues for safe use.  Pt initially walking to Rt of walker.  Cues provided to stay inside of walker, and he corrected.  Pt noted to run into desk and wall on Lt. side when ambulating with OT demonstrating no awareness of deficit ADL Comments: Pt stood to urinate, but missed the toilet, and urinated on floor.  Assisted pt with clean up.  pt unable to to access feet for LB ADLs due to pain in abdomen/groin.  Pt required encouragement to brush teeth.  Min verbal cues for sequence, problem solving and safety     OT Diagnosis:    OT Problem List:   OT Treatment Interventions:     OT Goals(current goals can now be found in the care plan section) Acute Rehab OT Goals Patient Stated Goal: return home to his dog OT Goal Formulation: With patient Time For Goal Achievement: 12/09/13 Potential to Achieve Goals: Good ADL Goals Pt Will Perform Upper Body Dressing: with modified independence;sitting Pt Will Perform Lower Body Dressing: with modified independence;sit to/from stand Pt Will Transfer to Toilet: with modified independence;ambulating;regular height toilet Pt Will Perform Toileting - Clothing Manipulation and hygiene: with modified independence;sit to/from stand Pt Will Perform Tub/Shower Transfer: Tub transfer;with supervision;ambulating Additional ADL Goal #1: Pt will perform bed mobility at mod I level as precursor for EOB ADLs.  Visit Information  Last OT Received On: 11/29/13 Assistance Needed: +1 History of Present Illness: s/p extensive aorta-bifemoral bypass graft. Post-op (and post-PT eval) Rt frontal CVA  Subjective Data      Prior Functioning       Cognition  Cognition Arousal/Alertness: Awake/alert Behavior During Therapy: Flat affect Overall Cognitive Status: Impaired/Different from baseline Area of Impairment: Attention;Safety/judgement;Awareness;Problem  solving Current Attention Level: Selective Safety/Judgement: Decreased awareness of safety Awareness: Intellectual Problem Solving: Slow processing;Difficulty sequencing;Requires verbal cues General Comments: Pt is slow to process, required min verbal cues for sequencing during simple grooming tasks, Questionable Lt. inattention with ambulation     Mobility       Exercises      Balance    End of Session OT - End of Session Equipment Utilized During Treatment: Rolling walker Activity Tolerance: Patient tolerated treatment well Patient left: in chair;with call bell/phone within reach Nurse Communication: Mobility status;Other (comment) (Possible Lt inattention)  GO     Larine Fielding M 11/29/2013, 1:04 PM

## 2013-11-29 NOTE — Progress Notes (Signed)
ANTICOAGULATION CONSULT NOTE - Follow Up Consult  Pharmacy Consult for Heparin  Indication: DVT  Allergies  Allergen Reactions  . Morphine And Related Other (See Comments)    "goes crazy"    Patient Measurements: Height: 5\' 6"  (167.6 cm) Weight: 149 lb 14.6 oz (68 kg) IBW/kg (Calculated) : 63.8  Vital Signs: Temp: 98.9 F (37.2 C) (01/09 0000) Temp src: Oral (01/09 0000) BP: 149/68 mmHg (01/09 0300) Pulse Rate: 82 (01/09 0300)  Labs:  Recent Labs  11/27/13 0215  11/28/13 0820 11/28/13 1405 11/29/13 0420  HGB 8.4*  --  9.2*  --  8.3*  HCT 24.5*  --  26.3*  --  24.3*  PLT 143*  --  136*  --  158  HEPARINUNFRC  --   < > 0.43 0.37 0.27*  CREATININE 0.83  --   --   --   --   < > = values in this interval not displayed.  Estimated Creatinine Clearance: 73.7 ml/min (by C-G formula based on Cr of 0.83).   Medications:  Heparin 1300 units/hr  Assessment: 73 y/o M on heparin for LLE DVT. Also with acute embolic infarct on 1/3 (requiring lower heparin level goal of 0.3-0.5). Neurology recommends change to Xarelto. HL this AM is 0.27. Other labs as above.   Goal of Therapy:  Heparin level 0.3-0.5 units/ml Monitor platelets by anticoagulation protocol: Yes   Plan:  -Increase heparin drip to 1400 units/hr -8 hour HL at 1400 -Daily CBC/HL -F/U chang to Xarelto  Thank you for allowing me to take part in this patient's care,  Narda Bonds, PharmD Clinical Pharmacist Phone: [phone] Pager: [phone] 11/29/2013 5:07 AM

## 2013-11-29 NOTE — Progress Notes (Signed)
INITIAL NUTRITION ASSESSMENT  DOCUMENTATION CODES Per approved criteria  -Not Applicable   INTERVENTION: Resource Breeze po BID, each supplement provides 250 kcal and 9 grams of protein RD to follow for nutrition care plan  NUTRITION DIAGNOSIS: Inadequate oral intake related to dislike of diet texture as evidenced by patient report  Goal: Pt to meet >/= 90% of their estimated nutrition needs   Monitor:  PO & supplemental intake, weight, labs, I/O's  Reason for Assessment: RN consult  73 y.o. male  Admitting Dx: Arterial occlusion due to stenosis  ASSESSMENT: Patient with PMH of COPD, CAD, HTN and DM; presented for surgery with diagnosis of PVD.  Patient s/p procedure 1/2: AORTA BIFEMORAL BYPASS GRAFT SUPERIOR MESENTERIC ARTERY BYPASS INFERIOR MESENTERIC BYPASS   Patient discussed in rounds 1/8 AM; patient reports he doesn't care for the texture of his Dys 1 diet (puree); states he was eating well PTA (at his local [employer]); no recent weight loss per weight readings; reports Ensure "make me sick;" RD brought Resource Breeze supplement for patient to try -- liked OK -- RD to order.  Height: Ht Readings from Last 1 Encounters:  11/22/13 5\' 6"  (1.676 m)    Weight: Wt Readings from Last 1 Encounters:  11/29/13 153 lb 3.5 oz (69.5 kg)    Ideal Body Weight: 142 lb  % Ideal Body Weight: 108%  Wt Readings from Last 10 Encounters:  11/29/13 153 lb 3.5 oz (69.5 kg)  11/29/13 153 lb 3.5 oz (69.5 kg)  11/29/13 153 lb 3.5 oz (69.5 kg)  11/19/13 142 lb (64.411 kg)  11/18/13 141 lb 14.4 oz (64.365 kg)  10/29/13 144 lb (65.318 kg)  10/29/13 144 lb (65.318 kg)  10/23/13 146 lb (66.225 kg)  10/16/13 144 lb (65.318 kg)  10/16/13 144 lb (65.318 kg)    Usual Body Weight: 144 lb  % Usual Body Weight: 106%  BMI:  Body mass index is 24.74 kg/(m^2).  Estimated Nutritional Needs: Kcal: 1700-1900 Protein: 80-90 gm Fluid: 1.7-1.9 L  Skin: abdominal & groin incisions  Diet  Order: Dysphagia 1, thin liquids  EDUCATION NEEDS: -No education needs identified at this time   Intake/Output Summary (Last 24 hours) at 11/29/13 0853 Last data filed at 11/29/13 0700  Gross per 24 hour  Intake   1208 ml  Output    850 ml  Net    358 ml    Labs:   Recent Labs Lab 11/22/13 1853 11/23/13 0410  11/25/13 0737 11/26/13 0355 11/27/13 0215  NA 137 138  < > 144 148* 145  K 5.3 5.4*  < > 4.7 3.7 3.4*  CL 105 104  < > 107 110 109  CO2 19 19  < > 24 23 27   BUN 12 15  < > 16 27* 27*  CREATININE 0.70 1.06  < > 0.82 0.84 0.83  CALCIUM 7.4* 7.8*  < > 8.5 8.2* 7.9*  MG 1.1* 2.7*  --   --   --   --   PHOS  --  4.8*  --   --   --   --   GLUCOSE 246* 216*  < > 158* 126* 109*  < > = values in this interval not displayed.  CBG (last 3)   Recent Labs  11/29/13 0018 11/29/13 0325 11/29/13 0736  GLUCAP 119* 136* 154*    Scheduled Meds: . aspirin  325 mg Oral Daily  . atorvastatin  40 mg Oral Daily  . docusate  100 mg Oral  Daily  . feeding supplement (RESOURCE BREEZE)  1 Container Oral BID WC  . insulin aspart  1-3 Units Subcutaneous Q4H  . insulin glargine  15 Units Subcutaneous Q24H  . pantoprazole sodium  40 mg Per Tube Daily    Continuous Infusions: . sodium chloride 20 mL/hr at 11/29/13 0700  . DOPamine    . heparin 1,400 Units/hr (11/29/13 0700)  . insulin (NOVOLIN-R) infusion 1.5 Units/hr (11/24/13 0500)  . lactated ringers 20 mL/hr at 11/22/13 0832  . lactated ringers 50 mL/hr at 11/22/13 1119    Past Medical History  Diagnosis Date  . Hyperlipidemia   . GERD (gastroesophageal reflux disease)   . Hypertension   . COPD (chronic obstructive pulmonary disease)   . Insomnia   . Gout   . DVT (deep venous thrombosis)   . Peripheral vascular disease   . Diabetes mellitus without complication   . Headache(784.0)     migranes- not in a few years  . Coronary artery disease     Past Surgical History  Procedure Laterality Date  . Hemicolectomy     . Aorta - bilateral femoral artery bypass graft N/A 11/22/2013    Procedure: AORTA BIFEMORAL BYPASS GRAFT; SUPERIOR MESENTERIC ARTERY BYPASS, INFERIOR MESENTERIC BYPASS;  Surgeon: Serafina Mitchell, MD;  Location: [place];  Service: Vascular;  Laterality: N/A;    Arthur Holms, RD, LDN Pager #: [phone] After-Hours Pager #: [phone]

## 2013-11-29 NOTE — Progress Notes (Signed)
I agree with the above.  The patient has been seen and examined.  For TEE today.  We'll be able to start clear liquid diet once he returns.  Patient has been angulatory.  His incisions are healing nicely.  His abdomen is soft.

## 2013-11-29 NOTE — Progress Notes (Signed)
Pt c/o of headache and sobbing at bedside. PT had noted that pt was running into objects on left side.Gerri Lins PA for Dr Trula Slade called and informed

## 2013-11-29 NOTE — Progress Notes (Signed)
Speech Language Pathology Treatment: Dysphagia  Patient Details Name: Perry Kennedy MRN: [id number] DOB: 04/16/1941 Today's Date: 11/29/2013 Time: 4481-8563 SLP Time Calculation (min): 11 min  Assessment / Plan / Recommendation Clinical Impression  Pt with persisting left CN VII deficits; asked to return for potential for diet upgrade.  Consumed mechanical solids and mixed consistencies with adequate toleration, no s/s of aspiration, improved attention to left buccal residue; mod I cues overall.  Recommend advancing diet to mechanical soft, thin liquids, meds whole in puree.  Pt for potential D/C home soon.  Rec HH SLP eval for speech/cognition.  Swallow goals met.  SLP to sign off.   HPI HPI: Perry Kennedy is an 73 y.o. male with a past medical history significant for HTN, hyperlipidemia, DM, CAD, PVD, COPD, gout, s/p extensive aorto-bifemoral bypass graft on 11/22/13, with acute onset of the left face weakness and dysarthria post-op. MRI reveals acute right frontal lobe infarct with small amt of petechial hemorrhage.       SLP Plan       Recommendations Diet recommendations: Dysphagia 3 (mechanical soft);Thin liquid Liquids provided via: Straw Medication Administration: Whole meds with puree Supervision: Patient able to self feed Compensations: Slow rate;Small sips/bites Postural Changes and/or Swallow Maneuvers: Seated upright 90 degrees;Upright 30-60 min after meal              Oral Care Recommendations: Oral care BID Follow up Recommendations: Home health SLP    Ashaun Gaughan L. Tivis Ringer, [place] CCC/SLP Pager [phone]      Juan Quam Laurice 11/29/2013, 10:23 AM

## 2013-11-29 NOTE — Progress Notes (Signed)
PT Cancellation Note  Patient Details Name: Perry Kennedy MRN: [id number] DOB: 1941-05-11   Cancelled Treatment:    Reason Eval/Treat Not Completed: Patient at procedure or test/unavailable. Pt going for head CT. Noted pt running into objects on his Left side with OT earlier today. That is new for him.   Gini Caputo 11/29/2013, 3:58 PM Pager [phone]

## 2013-11-29 NOTE — Progress Notes (Signed)
Pt to CT per orders.

## 2013-11-30 DIAGNOSIS — F4323 Adjustment disorder with mixed anxiety and depressed mood: Secondary | ICD-10-CM

## 2013-11-30 LAB — CBC
HEMATOCRIT: 21.8 % — AB (ref 39.0–52.0)
Hemoglobin: 7.6 g/dL — ABNORMAL LOW (ref 13.0–17.0)
MCH: 29.8 pg (ref 26.0–34.0)
MCHC: 34.9 g/dL (ref 30.0–36.0)
MCV: 85.5 fL (ref 78.0–100.0)
Platelets: 191 10*3/uL (ref 150–400)
RBC: 2.55 MIL/uL — ABNORMAL LOW (ref 4.22–5.81)
RDW: 15.1 % (ref 11.5–15.5)
WBC: 6.8 10*3/uL (ref 4.0–10.5)

## 2013-11-30 LAB — GLUCOSE, CAPILLARY
GLUCOSE-CAPILLARY: 108 mg/dL — AB (ref 70–99)
GLUCOSE-CAPILLARY: 108 mg/dL — AB (ref 70–99)
GLUCOSE-CAPILLARY: 127 mg/dL — AB (ref 70–99)
Glucose-Capillary: 117 mg/dL — ABNORMAL HIGH (ref 70–99)
Glucose-Capillary: 123 mg/dL — ABNORMAL HIGH (ref 70–99)
Glucose-Capillary: 96 mg/dL (ref 70–99)

## 2013-11-30 LAB — HEPARIN LEVEL (UNFRACTIONATED)
HEPARIN UNFRACTIONATED: 0.31 [IU]/mL (ref 0.30–0.70)
Heparin Unfractionated: 0.29 IU/mL — ABNORMAL LOW (ref 0.30–0.70)

## 2013-11-30 MED ORDER — FUROSEMIDE 10 MG/ML IJ SOLN
20.0000 mg | Freq: Once | INTRAMUSCULAR | Status: AC
  Administered 2013-11-30: 20 mg via INTRAVENOUS
  Filled 2013-11-30: qty 2

## 2013-11-30 MED ORDER — HEPARIN (PORCINE) IN NACL 100-0.45 UNIT/ML-% IJ SOLN
1800.0000 [IU]/h | INTRAMUSCULAR | Status: DC
  Administered 2013-11-30 – 2013-12-01 (×2): 1400 [IU]/h via INTRAVENOUS
  Administered 2013-12-02: 1550 [IU]/h via INTRAVENOUS
  Administered 2013-12-03 – 2013-12-04 (×2): 1800 [IU]/h via INTRAVENOUS
  Filled 2013-11-30 (×8): qty 250

## 2013-11-30 NOTE — H&P (Signed)
Reason for Consult: depression and s/p stroke and vascular surgery Referring Physician: Serafina Mitchell, MD   Perry Kennedy is an 73 y.o. male.  HPI: Patient was seen and chart reviewed. This was discussed with the patient and his staff RN who is at bedside. Reportedly patient has been feeling depressed, sad and tearful since he has been hospitalized and had a vascular surgery. Patient stated he wanted to get out of his bed, walk around and want to go home as soon as possible. Patient stated that people does not understand him much. Patient has son and other family members visited him in the hospital this morning and since then his mood has brightened up.   The patient recently underwent angiography which revealed an occluded left iliac system and a highly diseased right iliac system. He also has an occluded left superficial femoral artery. He also complains of pain in his left hip. He states that after he she also has significant pain in his abdomen. He will also abdominal pain when he does not deep.  The patient has significant coronary disease. He recently underwent cardiac catheterization and was cleared for surgery. The patient suffers from hypercholesterolemia and is managed with a stent. He is on an ACE inhibitor for his hypertension. He has COPD and continues to smoke was no desire to quit.   Mental Status Examination: Patient appeared lying in his bed with head end was elevated and able to eat his lunch with the spoon. He has a decreased psychomotor activity, reportedly trying to walk in hall way when he gets the chance which indicated he was motivated to get better so He appeared  asper his stated age, casually  in a hospital gown, and poorly groomed, and  has an eye contact. Patient has  anxious  mood and his affect was  appropriate with his mood . He has  slow l rate, rhythm, and  Low volume of speech. His thought process is linear and goal directed. Patient has denied suicidal, homicidal  ideations, intentions or plans. Patient has no evidence of auditory or visual hallucinations, delusions, and paranoia. Patient has fair insight judgment and impulse control.  Past Medical History  Diagnosis Date  . Hyperlipidemia   . GERD (gastroesophageal reflux disease)   . Hypertension   . COPD (chronic obstructive pulmonary disease)   . Insomnia   . Gout   . DVT (deep venous thrombosis)   . Peripheral vascular disease   . Diabetes mellitus without complication   . Headache(784.0)     migranes- not in a few years  . Coronary artery disease     Past Surgical History  Procedure Laterality Date  . Hemicolectomy    . Aorta - bilateral femoral artery bypass graft N/A 11/22/2013    Procedure: AORTA BIFEMORAL BYPASS GRAFT; SUPERIOR MESENTERIC ARTERY BYPASS, INFERIOR MESENTERIC BYPASS;  Surgeon: Serafina Mitchell, MD;  Location: [facility] OR;  Service: Vascular;  Laterality: N/A;    Family History  Problem Relation Age of Onset  . Heart disease Father 28    pacemaker  . Diabetes Mother     renal failure  . Heart disease Mother   . CAD Brother     died at 72  . COPD Brother   . Heart disease Brother   . Hyperlipidemia Daughter   . Diabetes Son   . Heart disease Son     before age 41  . Heart attack Son     Social History:  reports that  he has been smoking Cigarettes.  He has a 82.5 pack-year smoking history. He does not have any smokeless tobacco history on file. He reports that he does not drink alcohol or use illicit drugs.  Allergies:  Allergies  Allergen Reactions  . Morphine And Related Other (See Comments)    "goes crazy"    Medications: I have reviewed the patient's current medications.  Results for orders placed during the hospital encounter of 11/22/13 (from the past 48 hour(s))  GLUCOSE, CAPILLARY     Status: Abnormal   Collection Time    11/28/13 11:44 AM      Result Value Range   Glucose-Capillary 159 (*) 70 - 99 mg/dL   Comment 1 Documented in Chart      Comment 2 Notify RN    HEPARIN LEVEL (UNFRACTIONATED)     Status: None   Collection Time    11/28/13  2:05 PM      Result Value Range   Heparin Unfractionated 0.37  0.30 - 0.70 IU/mL   Comment:            IF HEPARIN RESULTS ARE BELOW     EXPECTED VALUES, AND PATIENT     DOSAGE HAS BEEN CONFIRMED,     SUGGEST FOLLOW UP TESTING     OF ANTITHROMBIN III LEVELS.  GLUCOSE, CAPILLARY     Status: Abnormal   Collection Time    11/28/13  4:51 PM      Result Value Range   Glucose-Capillary 182 (*) 70 - 99 mg/dL   Comment 1 Notify RN     Comment 2 Documented in Chart    GLUCOSE, CAPILLARY     Status: Abnormal   Collection Time    11/28/13  7:59 PM      Result Value Range   Glucose-Capillary 129 (*) 70 - 99 mg/dL  GLUCOSE, CAPILLARY     Status: Abnormal   Collection Time    11/29/13 12:18 AM      Result Value Range   Glucose-Capillary 119 (*) 70 - 99 mg/dL  GLUCOSE, CAPILLARY     Status: Abnormal   Collection Time    11/29/13  3:25 AM      Result Value Range   Glucose-Capillary 136 (*) 70 - 99 mg/dL  HEPARIN LEVEL (UNFRACTIONATED)     Status: Abnormal   Collection Time    11/29/13  4:20 AM      Result Value Range   Heparin Unfractionated 0.27 (*) 0.30 - 0.70 IU/mL   Comment:            IF HEPARIN RESULTS ARE BELOW     EXPECTED VALUES, AND PATIENT     DOSAGE HAS BEEN CONFIRMED,     SUGGEST FOLLOW UP TESTING     OF ANTITHROMBIN III LEVELS.  CBC     Status: Abnormal   Collection Time    11/29/13  4:20 AM      Result Value Range   WBC 7.4  4.0 - 10.5 K/uL   RBC 2.82 (*) 4.22 - 5.81 MIL/uL   Hemoglobin 8.3 (*) 13.0 - 17.0 g/dL   HCT 24.3 (*) 39.0 - 52.0 %   MCV 86.2  78.0 - 100.0 fL   MCH 29.4  26.0 - 34.0 pg   MCHC 34.2  30.0 - 36.0 g/dL   RDW 14.8  11.5 - 15.5 %   Platelets 158  150 - 400 K/uL  GLUCOSE, CAPILLARY     Status: Abnormal   Collection  Time    11/29/13  7:36 AM      Result Value Range   Glucose-Capillary 154 (*) 70 - 99 mg/dL  GLUCOSE, CAPILLARY     Status:  Abnormal   Collection Time    11/29/13 11:16 AM      Result Value Range   Glucose-Capillary 131 (*) 70 - 99 mg/dL  HEPARIN LEVEL (UNFRACTIONATED)     Status: None   Collection Time    11/29/13  2:10 PM      Result Value Range   Heparin Unfractionated 0.52  0.30 - 0.70 IU/mL   Comment:            IF HEPARIN RESULTS ARE BELOW     EXPECTED VALUES, AND PATIENT     DOSAGE HAS BEEN CONFIRMED,     SUGGEST FOLLOW UP TESTING     OF ANTITHROMBIN III LEVELS.  GLUCOSE, CAPILLARY     Status: Abnormal   Collection Time    11/29/13  3:34 PM      Result Value Range   Glucose-Capillary 110 (*) 70 - 99 mg/dL  GLUCOSE, CAPILLARY     Status: None   Collection Time    11/29/13  7:31 PM      Result Value Range   Glucose-Capillary 77  70 - 99 mg/dL   Comment 1 Documented in Chart     Comment 2 Notify RN    GLUCOSE, CAPILLARY     Status: Abnormal   Collection Time    11/29/13 11:52 PM      Result Value Range   Glucose-Capillary 108 (*) 70 - 99 mg/dL   Comment 1 Documented in Chart     Comment 2 Notify RN    HEPARIN LEVEL (UNFRACTIONATED)     Status: Abnormal   Collection Time    11/30/13  2:00 AM      Result Value Range   Heparin Unfractionated 0.29 (*) 0.30 - 0.70 IU/mL   Comment:            IF HEPARIN RESULTS ARE BELOW     EXPECTED VALUES, AND PATIENT     DOSAGE HAS BEEN CONFIRMED,     SUGGEST FOLLOW UP TESTING     OF ANTITHROMBIN III LEVELS.  CBC     Status: Abnormal   Collection Time    11/30/13  2:00 AM      Result Value Range   WBC 6.8  4.0 - 10.5 K/uL   RBC 2.55 (*) 4.22 - 5.81 MIL/uL   Hemoglobin 7.6 (*) 13.0 - 17.0 g/dL   HCT 21.8 (*) 39.0 - 52.0 %   MCV 85.5  78.0 - 100.0 fL   MCH 29.8  26.0 - 34.0 pg   MCHC 34.9  30.0 - 36.0 g/dL   RDW 15.1  11.5 - 15.5 %   Platelets 191  150 - 400 K/uL  GLUCOSE, CAPILLARY     Status: Abnormal   Collection Time    11/30/13  3:36 AM      Result Value Range   Glucose-Capillary 117 (*) 70 - 99 mg/dL   Comment 1 Documented in Chart      Comment 2 Notify RN    GLUCOSE, CAPILLARY     Status: Abnormal   Collection Time    11/30/13  7:09 AM      Result Value Range   Glucose-Capillary 108 (*) 70 - 99 mg/dL   Comment 1 Notify RN      Ct Head Wo  Contrast  11/29/2013   CLINICAL DATA:  Subacute infarction.  New headache  EXAM: CT HEAD WITHOUT CONTRAST  TECHNIQUE: Contiguous axial images were obtained from the base of the skull through the vertex without intravenous contrast.  COMPARISON:  MR HEAD W/O CM dated 11/24/2013; CT HEAD W/O CM dated 11/23/2013  FINDINGS: Low-attenuation cortical region in the right frontal lobe corresponds to acute infarction on comparison MRI. There is no additional evidence of acute cortical infarction. There is no intracranial hemorrhage. No midline shift or mass effect. Basilar cisterns are patent. No hydrocephalus.  There is mild cortical atrophy. Mild periventricular subcortical white matter hypodensities. Paranasal sinuses and mastoid air cells are clear.  IMPRESSION: 1. Expected evolution of subacute right frontal cortical infarction. 2. No acute intracranial findings.   Electronically Signed   By: Suzy Bouchard M.D.   On: 11/29/2013 16:20    Positive for anxiety, bad mood, depression and sleep disturbance Blood pressure 144/69, pulse 81, temperature 99.4 F (37.4 C), temperature source Oral, resp. rate 16, height 5\' 6"  (1.676 m), weight 68.4 kg (150 lb 12.7 oz), SpO2 91.00%.   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 11/30/2013, 11:36 AM

## 2013-11-30 NOTE — Progress Notes (Signed)
NUTRITION FOLLOW UP  Intervention:   Continue Resource Breeze po BID, each supplement provides 250 kcal and 9 grams of protein Provide Snacks TID Encourage PO intake  Nutrition Dx:   Inadequate oral intake related to dislike of diet texture as evidenced by patient report; ongoing, poor appetite, refusing meals  Goal:   Pt to meet >/= 90% of their estimated nutrition needs; not met  Monitor:   PO intake; refusing meals Supplement intake; accepting Weight; 3 lb weight decrease since yesterday Labs; low hemoglobin, blood glucose 77 to 157 mg/dL I/O's: +9.1 L   Assessment:   Patient with PMH of COPD, CAD, HTN and DM; presented for surgery with diagnosis of PVD.  Patient s/p procedure 1/2:  AORTA BIFEMORAL BYPASS GRAFT  SUPERIOR MESENTERIC ARTERY BYPASS  INFERIOR MESENTERIC BYPASS    Pt assessed by RD 1/9 and added Resource Breeze BID. RD re-consulted today. Pt states he didn't eat any breakfast today or any dinner last night. He states his appetite is still poor. He doesn't really like the [employer] but, states he will drink it anyway. Encouraged PO intake and snacking; will add snacks.   Height: Ht Readings from Last 1 Encounters:  11/22/13 _0  (1.676 m)    Weight Status:   Wt Readings from Last 1 Encounters:  11/30/13 150 lb 12.7 oz (68.4 kg)    Re-estimated needs:  Kcal: 1700-1900  Protein: 80-90 gm  Fluid: 1.7-1.9 L   Skin: +1 generalized edema, +1 RLE and LLE edema, +2 facial edema, +1 perineal edema; abdominal & groin incisions   Diet Order: Dysphagia   Intake/Output Summary (Last 24 hours) at 11/30/13 1208 Last data filed at 11/30/13 1100  Gross per 24 hour  Intake 1074.35 ml  Output   2600 ml  Net -1525.65 ml    Last BM: 1/9   Labs:   Recent Labs Lab 11/25/13 0737 11/26/13 0355 11/27/13 0215  NA 144 148* 145  K 4.7 3.7 3.4*  CL 107 110 109  CO2 _1 BUN 16 27* 27*  CREATININE 0.82 0.84 0.83  CALCIUM 8.5 8.2* 7.9*  GLUCOSE  158* 126* 109*    CBG (last 3)   Recent Labs  11/29/13 2352 11/30/13 0336 11/30/13 0709  GLUCAP 108* 117* 108*    Scheduled Meds: . aspirin  325 mg Oral Daily  . atorvastatin  40 mg Oral Daily  . docusate  100 mg Oral Daily  . feeding supplement (RESOURCE BREEZE)  1 Container Oral BID WC  . insulin aspart  1-3 Units Subcutaneous Q4H  . insulin glargine  15 Units Subcutaneous Q24H  . pantoprazole sodium  40 mg Per Tube Daily    Continuous Infusions: . sodium chloride 20 mL/hr at 11/29/13 0700  . heparin 1,400 Units/hr (11/30/13 1100)  . insulin (NOVOLIN-R) infusion 1.5 Units/hr (11/24/13 0500)  . lactated ringers 20 mL/hr at 11/22/13 0832  . lactated ringers 50 mL/hr at 11/22/13 [place], LDN Inpatient Clinical Dietitian Pager: [phone] After Hours Pager: [phone]

## 2013-11-30 NOTE — Progress Notes (Signed)
ANTICOAGULATION CONSULT NOTE - Follow Up Consult  Pharmacy Consult for Heparin  Indication: DVT  Allergies  Allergen Reactions  . Morphine And Related Other (See Comments)    "goes crazy"    Patient Measurements: Height: 5\' 6"  (167.6 cm) Weight: 150 lb 12.7 oz (68.4 kg) IBW/kg (Calculated) : 63.8  Vital Signs: Temp: 98.8 F (37.1 C) (01/10 1519) Temp src: Oral (01/10 1519) BP: 134/63 mmHg (01/10 1600) Pulse Rate: 81 (01/10 1600)  Labs:  Recent Labs  11/28/13 0820  11/29/13 0420 11/29/13 1410 11/30/13 0200 11/30/13 1630  HGB 9.2*  --  8.3*  --  7.6*  --   HCT 26.3*  --  24.3*  --  21.8*  --   PLT 136*  --  158  --  191  --   HEPARINUNFRC 0.43  < > 0.27* 0.52 0.29* 0.31  < > = values in this interval not displayed.  Estimated Creatinine Clearance: 73.7 ml/min (by C-G formula based on Cr of 0.83).  Medications:  Heparin 1400 units/hr  Assessment: 73 y/o M on heparin for LLE DVT. Also with acute embolic infarct on 1/3 (requiring lower heparin level goal of 0.3-0.5). Neurology recommends change to Xarelto. HL is now 0.31 after increase to 1400 units/hr  Goal of Therapy:  Heparin level 0.3-0.5 units/ml Monitor platelets by anticoagulation protocol: Yes   Plan:  -No heparin changes -Heparin level and CBC daily -Will follow oral anticoagulation plan  Hildred Laser, Pharm D 11/30/2013 5:30 PM

## 2013-11-30 NOTE — Progress Notes (Signed)
    Subjective  - POD #8  C/o HA Leans to left with PT Repeat CT yesterday : No changes   Physical Exam:  Resting comfortably in bed Incisions ok Feet warm, doppler signals abd soft, non-tender       Assessment/Plan:  POD #8   Neuro:  Repeat head CT w/ no sig changes.  Con't heparin gtt.  Does lean to left with ambulation.  PFO on TEE, cards following  PT/OT:  Doing very well with ambulation Transfer to circle on 2000 Protein calorie malnutrition:  Encourage PO, tolerating diet  BRABHAM IV, V. WELLS 11/30/2013 7:43 AM --  Filed Vitals:   11/30/13 0711  BP:   Pulse:   Temp: 99 F (37.2 C)  Resp:     Intake/Output Summary (Last 24 hours) at 11/30/13 0743 Last data filed at 11/30/13 0700  Gross per 24 hour  Intake 1347.5 ml  Output   1600 ml  Net -252.5 ml     Laboratory CBC    Component Value Date/Time   WBC 6.8 11/30/2013 0200   WBC 10.0 10/23/2013 1527   WBC 13.8* 10/03/2013 1632   HGB 7.6* 11/30/2013 0200   HGB 15.6 10/03/2013 1632   HCT 21.8* 11/30/2013 0200   HCT 47.4 10/03/2013 1632   PLT 191 11/30/2013 0200    BMET    Component Value Date/Time   NA 145 11/27/2013 0215   NA 135 10/23/2013 1527   K 3.4* 11/27/2013 0215   CL 109 11/27/2013 0215   CO2 27 11/27/2013 0215   GLUCOSE 109* 11/27/2013 0215   GLUCOSE 123* 10/23/2013 1527   BUN 27* 11/27/2013 0215   BUN 11 10/23/2013 1527   CREATININE 0.83 11/27/2013 0215   CALCIUM 7.9* 11/27/2013 0215   GFRNONAA 86* 11/27/2013 0215   GFRAA >90 11/27/2013 0215    COAG Lab Results  Component Value Date   INR 1.20 11/24/2013   INR 1.48 11/22/2013   INR 1.39 11/22/2013   No results found for this basename: PTT    Antibiotics Anti-infectives   Start     Dose/Rate Route Frequency Ordered Stop   11/23/13 0000  metroNIDAZOLE (FLAGYL) IVPB 500 mg  Status:  Discontinued     500 mg 100 mL/hr over 60 Minutes Intravenous 4 times per day 11/22/13 2147 11/25/13 0748   11/22/13 2200  ciprofloxacin (CIPRO) IVPB 400 mg   Status:  Discontinued     400 mg 200 mL/hr over 60 Minutes Intravenous Every 12 hours 11/22/13 1854 11/25/13 0748   11/22/13 1515  cefUROXime (ZINACEF) 1.5 g in dextrose 5 % 50 mL IVPB     1.5 g 100 mL/hr over 30 Minutes Intravenous To Surgery 11/22/13 1501 11/23/13 1515   11/21/13 1439  cefUROXime (ZINACEF) 1.5 g in dextrose 5 % 50 mL IVPB  Status:  Discontinued     1.5 g 100 mL/hr over 30 Minutes Intravenous 30 min pre-op 11/21/13 1439 11/22/13 1545       V. Leia Alf, M.D. [facility] Office: [phone] Pager:  [phone]

## 2013-11-30 NOTE — Progress Notes (Signed)
ANTICOAGULATION CONSULT NOTE - Follow Up Consult  Pharmacy Consult for Heparin  Indication: DVT  Allergies  Allergen Reactions  . Morphine And Related Other (See Comments)    "goes crazy"    Patient Measurements: Height: 5\' 6"  (167.6 cm) Weight: 150 lb 12.7 oz (68.4 kg) IBW/kg (Calculated) : 63.8  Vital Signs: Temp: 99 F (37.2 C) (01/10 0711) Temp src: Oral (01/10 0711) BP: 144/67 mmHg (01/10 0600) Pulse Rate: 86 (01/10 0700)  Labs:  Recent Labs  11/28/13 0820  11/29/13 0420 11/29/13 1410 11/30/13 0200  HGB 9.2*  --  8.3*  --  7.6*  HCT 26.3*  --  24.3*  --  21.8*  PLT 136*  --  158  --  191  HEPARINUNFRC 0.43  < > 0.27* 0.52 0.29*  < > = values in this interval not displayed.  Estimated Creatinine Clearance: 73.7 ml/min (by C-G formula based on Cr of 0.83).  Medications:  Heparin 1350 units/hr  Assessment: 73 y/o M on heparin for LLE DVT. Also with acute embolic infarct on 1/3 (requiring lower heparin level goal of 0.3-0.5). Neurology recommends change to Xarelto. HL is now 0.29 which is minimally below goal. Nurse reported the heparin drip was not stopped at anytime. No bleeding noted but Hgb has trended down to 7.6.  Goal of Therapy:  Heparin level 0.3-0.5 units/ml Monitor platelets by anticoagulation protocol: Yes   Plan:  - Increase heparin drip slightly to 1400 units/hr - Follow-up heparin level at 1600 on 1/10 - Monitor for S/S of bleeding   Roderic Palau A. Pincus Badder, PharmD Clinical Pharmacist - Resident Pager: [phone] Pharmacy: [phone] 11/30/2013 7:22 AM

## 2013-11-30 NOTE — Progress Notes (Signed)
Patient's breakfast was brought to him, patient stated  "he did not want to eat anything, doesn't feel like eating"  Will provide emotional support

## 2013-12-01 LAB — BASIC METABOLIC PANEL
BUN: 10 mg/dL (ref 6–23)
CO2: 30 mEq/L (ref 19–32)
CREATININE: 0.71 mg/dL (ref 0.50–1.35)
Calcium: 8.1 mg/dL — ABNORMAL LOW (ref 8.4–10.5)
Chloride: 97 mEq/L (ref 96–112)
Glucose, Bld: 92 mg/dL (ref 70–99)
Potassium: 3.2 mEq/L — ABNORMAL LOW (ref 3.7–5.3)
Sodium: 138 mEq/L (ref 137–147)

## 2013-12-01 LAB — GLUCOSE, CAPILLARY
GLUCOSE-CAPILLARY: 101 mg/dL — AB (ref 70–99)
GLUCOSE-CAPILLARY: 164 mg/dL — AB (ref 70–99)
GLUCOSE-CAPILLARY: 69 mg/dL — AB (ref 70–99)
GLUCOSE-CAPILLARY: 90 mg/dL (ref 70–99)
Glucose-Capillary: 104 mg/dL — ABNORMAL HIGH (ref 70–99)
Glucose-Capillary: 126 mg/dL — ABNORMAL HIGH (ref 70–99)
Glucose-Capillary: 154 mg/dL — ABNORMAL HIGH (ref 70–99)
Glucose-Capillary: 118 mg/dL — ABNORMAL HIGH (ref 70–99)

## 2013-12-01 LAB — CBC
HEMATOCRIT: 20.8 % — AB (ref 39.0–52.0)
Hemoglobin: 7.3 g/dL — ABNORMAL LOW (ref 13.0–17.0)
MCH: 30.2 pg (ref 26.0–34.0)
MCHC: 35.1 g/dL (ref 30.0–36.0)
MCV: 86 fL (ref 78.0–100.0)
Platelets: 249 10*3/uL (ref 150–400)
RBC: 2.42 MIL/uL — ABNORMAL LOW (ref 4.22–5.81)
RDW: 15.4 % (ref 11.5–15.5)
WBC: 5.9 10*3/uL (ref 4.0–10.5)

## 2013-12-01 LAB — PREPARE RBC (CROSSMATCH)

## 2013-12-01 LAB — HEPARIN LEVEL (UNFRACTIONATED): Heparin Unfractionated: 0.38 IU/mL (ref 0.30–0.70)

## 2013-12-01 MED ORDER — FUROSEMIDE 10 MG/ML IJ SOLN
INTRAMUSCULAR | Status: AC
  Filled 2013-12-01: qty 4

## 2013-12-01 MED ORDER — FUROSEMIDE 10 MG/ML IJ SOLN
20.0000 mg | Freq: Once | INTRAMUSCULAR | Status: AC
  Administered 2013-12-01: 20 mg via INTRAVENOUS

## 2013-12-01 NOTE — Progress Notes (Signed)
Patient transferred to 2W21 on monitor, patient placed in bed on 2W, patient tolerated transfer, neuro status intact/unchaged, VS stable.

## 2013-12-01 NOTE — Progress Notes (Signed)
    Subjective  - POD #9  Denies pain. Tolerated diet yesterday, although he is not eating much.  He is somewhat apprehensive about dysphagia from stroke.   Physical Exam:  Abdomen: Soft nontender, nondistended Extremities: Warm and well perfused Incisions: Healing nicely.     Assessment/Plan:  POD #9  GI: Has been evaluated by speech and clear for dysphagia 1 diet.  He is somewhat reluctant to eat because of concerns of aspiration. dispo: Patient transferred to the floor today Neuro: Continues to have mild dysphagia and left-sided facial droop with slurred speech.  He is on therapeutic heparin which will be transitioned toXaralto prior to discharge. Hopefully he will be able to go to inpatient rehabilitation. Acute blood loss anemia: The patient to transfuse 2 units of blood today. Hypokalemia: Electrolyte replacement protocols are in place.  Sakiyah Shur IV, V. WELLS 12/01/2013 8:16 AM --  Filed Vitals:   12/01/13 0713  BP:   Pulse:   Temp: 98.3 F (36.8 C)  Resp:     Intake/Output Summary (Last 24 hours) at 12/01/13 0816 Last data filed at 12/01/13 0700  Gross per 24 hour  Intake    734 ml  Output   1875 ml  Net  -1141 ml     Laboratory CBC    Component Value Date/Time   WBC 5.9 12/01/2013 0400   WBC 10.0 10/23/2013 1527   WBC 13.8* 10/03/2013 1632   HGB 7.3* 12/01/2013 0400   HGB 15.6 10/03/2013 1632   HCT 20.8* 12/01/2013 0400   HCT 47.4 10/03/2013 1632   PLT 249 12/01/2013 0400    BMET    Component Value Date/Time   NA 138 12/01/2013 0400   NA 135 10/23/2013 1527   K 3.2* 12/01/2013 0400   CL 97 12/01/2013 0400   CO2 30 12/01/2013 0400   GLUCOSE 92 12/01/2013 0400   GLUCOSE 123* 10/23/2013 1527   BUN 10 12/01/2013 0400   BUN 11 10/23/2013 1527   CREATININE 0.71 12/01/2013 0400   CALCIUM 8.1* 12/01/2013 0400   GFRNONAA >90 12/01/2013 0400   GFRAA >90 12/01/2013 0400    COAG Lab Results  Component Value Date   INR 1.20 11/24/2013   INR 1.48 11/22/2013   INR 1.39 11/22/2013   No results found for this basename: PTT    Antibiotics Anti-infectives   Start     Dose/Rate Route Frequency Ordered Stop   11/23/13 0000  metroNIDAZOLE (FLAGYL) IVPB 500 mg  Status:  Discontinued     500 mg 100 mL/hr over 60 Minutes Intravenous 4 times per day 11/22/13 2147 11/25/13 0748   11/22/13 2200  ciprofloxacin (CIPRO) IVPB 400 mg  Status:  Discontinued     400 mg 200 mL/hr over 60 Minutes Intravenous Every 12 hours 11/22/13 1854 11/25/13 0748   11/22/13 1515  cefUROXime (ZINACEF) 1.5 g in dextrose 5 % 50 mL IVPB     1.5 g 100 mL/hr over 30 Minutes Intravenous To Surgery 11/22/13 1501 11/23/13 1515   11/21/13 1439  cefUROXime (ZINACEF) 1.5 g in dextrose 5 % 50 mL IVPB  Status:  Discontinued     1.5 g 100 mL/hr over 30 Minutes Intravenous 30 min pre-op 11/21/13 1439 11/22/13 1545       V. Leia Alf, M.D. [facility] Office: [phone] Pager:  [phone]

## 2013-12-01 NOTE — Progress Notes (Addendum)
Patient states that he feels like he isnt swallowing well, upon assessing patient prior to giving meds and after giving meds, patient was able to swallow on command, cough on command, after administering meds, patient did not cough, did not have trouble swallowing, however patient "feels that he isnt swallowing well enough and because of that he is scared, he does not feel like eating"  Paged SLP, spoke with SLP.  SLP consult entered, patient will be seen this afternoon.  Patient's emotional about his recovery, patient is also has periods where he is more emotional than other times.  Emotional support and comfort provided.    Meds were and have been administered following SLP recommendations, pills given with puree, thin liquids with straws, DYS 3 diet

## 2013-12-01 NOTE — Progress Notes (Signed)
ANTICOAGULATION CONSULT NOTE - Follow Up Consult  Pharmacy Consult for Heparin  Indication: DVT  Allergies  Allergen Reactions  . Morphine And Related Other (See Comments)    "goes crazy"    Patient Measurements: Height: 5\' 6"  (167.6 cm) Weight: 150 lb 12.7 oz (68.4 kg) IBW/kg (Calculated) : 63.8  Vital Signs: Temp: 98.6 F (37 C) (01/11 1104) Temp src: Oral (01/11 1104) BP: 163/72 mmHg (01/11 1013) Pulse Rate: 81 (01/11 1013)  Labs:  Recent Labs  11/29/13 0420  11/30/13 0200 11/30/13 1630 12/01/13 0400  HGB 8.3*  --  7.6*  --  7.3*  HCT 24.3*  --  21.8*  --  20.8*  PLT 158  --  191  --  249  HEPARINUNFRC 0.27*  < > 0.29* 0.31 0.38  CREATININE  --   --   --   --  0.71  < > = values in this interval not displayed.  Estimated Creatinine Clearance: 76.4 ml/min (by C-G formula based on Cr of 0.71).  Medications:  Heparin 1400 units/hr  Assessment: 73 y/o M on heparin for LLE DVT. Also with acute embolic infarct on 1/3 (requiring lower heparin level goal of 0.3-0.5). Neurology recommends change to Xarelto. HL remains therapeutic at 0.38. Hgb trended down to 7.3. Plt wnl. Plan is to transfuse 2 units PRBCs today.   Goal of Therapy:  Heparin level 0.3-0.5 units/ml Monitor platelets by anticoagulation protocol: Yes   Plan:  -No heparin changes. Continue at current insfuion rate.  -Daily heparin level and CBC daily -Will follow oral anticoagulation plan  Albertina Parr, PharmD.  Clinical Pharmacist Pager [phone]

## 2013-12-02 LAB — TYPE AND SCREEN
ABO/RH(D): A POS
Antibody Screen: NEGATIVE
UNIT DIVISION: 0
Unit division: 0

## 2013-12-02 LAB — BASIC METABOLIC PANEL
BUN: 6 mg/dL (ref 6–23)
CHLORIDE: 98 meq/L (ref 96–112)
CO2: 29 meq/L (ref 19–32)
Calcium: 8.2 mg/dL — ABNORMAL LOW (ref 8.4–10.5)
Creatinine, Ser: 0.77 mg/dL (ref 0.50–1.35)
GFR calc Af Amer: >90 mL/min (ref >90)
GFR calc non Af Amer: 89 mL/min — ABNORMAL LOW (ref >90)
Glucose, Bld: 164 mg/dL — ABNORMAL HIGH (ref 70–99)
Potassium: 3.7 mEq/L (ref 3.7–5.3)
SODIUM: 138 meq/L (ref 137–147)

## 2013-12-02 LAB — GLUCOSE, CAPILLARY
GLUCOSE-CAPILLARY: 172 mg/dL — AB (ref 70–99)
Glucose-Capillary: 172 mg/dL — ABNORMAL HIGH (ref 70–99)
Glucose-Capillary: 148 mg/dL — ABNORMAL HIGH (ref 70–99)
Glucose-Capillary: 132 mg/dL — ABNORMAL HIGH (ref 70–99)
Glucose-Capillary: 184 mg/dL — ABNORMAL HIGH (ref 70–99)

## 2013-12-02 LAB — CBC
HCT: 26.6 % — ABNORMAL LOW (ref 39.0–52.0)
Hemoglobin: 9.5 g/dL — ABNORMAL LOW (ref 13.0–17.0)
MCH: 30.2 pg (ref 26.0–34.0)
MCHC: 35.7 g/dL (ref 30.0–36.0)
MCV: 84.4 fL (ref 78.0–100.0)
PLATELETS: 269 10*3/uL (ref 150–400)
RBC: 3.15 MIL/uL — AB (ref 4.22–5.81)
RDW: 15.4 % (ref 11.5–15.5)
WBC: 5.5 10*3/uL (ref 4.0–10.5)

## 2013-12-02 LAB — HEPARIN LEVEL (UNFRACTIONATED)
Heparin Unfractionated: 0.17 IU/mL — ABNORMAL LOW (ref 0.30–0.70)
Heparin Unfractionated: 0.36 IU/mL (ref 0.30–0.70)

## 2013-12-02 MED ORDER — FUROSEMIDE 10 MG/ML IJ SOLN
100.0000 mg | Freq: Once | INTRAVENOUS | Status: AC
  Administered 2013-12-02: 100 mg via INTRAVENOUS
  Filled 2013-12-02: qty 10

## 2013-12-02 NOTE — Progress Notes (Addendum)
[facility] [place]  Subjective  - He states his feet feel better and are warmer since surgery.   Objective 160/70 78 98.8 F (37.1 C) (Oral) 18 95%  Intake/Output Summary (Last 24 hours) at 12/02/13 3875 Last data filed at 12/02/13 0400  Gross per 24 hour  Intake 1834.33 ml  Output   1900 ml  Net -65.67 ml    Doppler singles DP/PT Edema left greater than right LE Incisions clean and dry.  No hematoma or erythema. Lungs right inspiratory crackles to auscultation. Positive BS. Left facial droop  Assessment/Planning: POD #10  CIR pending eval Cont heparin plan to change to xarelto prior discharge K+ stable 2 units PRBC transfused     Laurence Slate [facility] 12/02/2013 8:12 AM --  Laboratory Lab Results:  Recent Labs  12/01/13 0400 12/02/13 0458  WBC 5.9 5.5  HGB 7.3* 9.5*  HCT 20.8* 26.6*  PLT 249 269   BMET  Recent Labs  12/01/13 0400 12/02/13 0458  NA 138 138  K 3.2* 3.7  CL 97 98  CO2 30 29  GLUCOSE 92 164*  BUN 10 6  CREATININE 0.71 0.77  CALCIUM 8.1* 8.2*    COAG Lab Results  Component Value Date   INR 1.20 11/24/2013   INR 1.48 11/22/2013   INR 1.39 11/22/2013   No results found for this basename: PTT    I agree with the above.  The patient continues to be somewhat apprehensive about being in the hospital and was to go home.  I told him I would like to have her evaluated by inpatient rehabilitation to help improve his chances of doing well at home.  Overall I think he is making good progress.  I will need to keep watching on his hematocrit as this has decreased since his transfusion.  He continues on heparin drip for his stroke and DVT.  He would need to be transitioned to oral agents prior to discharge.  Annamarie Major

## 2013-12-02 NOTE — Progress Notes (Signed)
ANTICOAGULATION CONSULT NOTE - Follow Up Consult  Pharmacy Consult for Heparin  Indication: DVT  Allergies  Allergen Reactions  . Morphine And Related Other (See Comments)    "goes crazy"    Patient Measurements: Height: 5\' 6"  (167.6 cm) Weight: 150 lb 12.7 oz (68.4 kg) IBW/kg (Calculated) : 63.8  Vital Signs: Temp: 98.8 F (37.1 C) (01/12 0435) Temp src: Oral (01/12 0435) BP: 160/70 mmHg (01/12 0451) Pulse Rate: 78 (01/12 0435)  Labs:  Recent Labs  11/30/13 0200 11/30/13 1630 12/01/13 0400 12/02/13 0458  HGB 7.6*  --  7.3*  --   HCT 21.8*  --  20.8*  --   PLT 191  --  249  --   HEPARINUNFRC 0.29* 0.31 0.38 0.17*  CREATININE  --   --  0.71  --    Estimated Creatinine Clearance: 76.4 ml/min (by C-G formula based on Cr of 0.71).  Medications:  Heparin 1400 units/hr  Assessment: 73 y/o M on heparin for LLE DVT. Noted acute embolic infarct (lower heparin goal). Will change to Xarelto prior to DC. HL is low today at 0.17. Other labs as above.   Goal of Therapy:  Heparin level 0.3-0.5 units/ml Monitor platelets by anticoagulation protocol: Yes   Plan:  -Increase heparin drip to 1550 units/hr -8 hour HL at 1400 -Daily CBC/HL -Monitor for bleeding -F/U change to Xarelto  Narda Bonds 12/02/2013,6:10 AM

## 2013-12-02 NOTE — Progress Notes (Signed)
Unable to complete evaluation today. Will f/u 1/13.  [place], [place] [phone]

## 2013-12-02 NOTE — Progress Notes (Signed)
Occupational Therapy Treatment Patient Details Name: NEERAJ HOUSAND MRN: [id number] DOB: [birth year] Today's Date: 12/02/2013 Time: 3536-1443 OT Time Calculation (min): 31 min  OT Assessment / Plan / Recommendation  History of present illness s/p extensive aorta-bifemoral bypass graft. Post-op (and post-PT eval) Rt frontal CVA   OT comments  Pt with significant improvement today cognitively and perceptually.  No evidence of Lt. Inattention today.  Pt requires min guard assist for BADLs.   Follow Up Recommendations  Home health OT;Supervision - Intermittent    Barriers to Discharge       Equipment Recommendations  Tub/shower seat    Recommendations for Other Services    Frequency Min 2X/week   Progress towards OT Goals Progress towards OT goals: Progressing toward goals  Plan Discharge plan remains appropriate    Precautions / Restrictions Precautions Precautions: Fall Restrictions Weight Bearing Restrictions: No   Pertinent Vitals/Pain     ADL  Lower Body Bathing: Min guard Where Assessed - Lower Body Bathing: Supported sit to stand Lower Body Dressing: Min guard Where Assessed - Lower Body Dressing: Supported sit to stand Toilet Transfer: Magazine features editor Method: Arts development officer: Regular height toilet Toileting - Water quality scientist and Hygiene: Min guard Where Assessed - Best boy and Hygiene: Standing Transfers/Ambulation Related to ADLs: min guard assist pushing IV pole ADL Comments: Pt with no evidence of Lt. inattention today.   Pt able to access bil. feet for LB ADLs, but causes increased discomfort/pain.  Condom catheter came off while pt ambulating.  Demonstrated good problem solving     OT Diagnosis:    OT Problem List:   OT Treatment Interventions:     OT Goals(current goals can now be found in the care plan section) Acute Rehab OT Goals Patient Stated Goal: return home to his dog OT Goal  Formulation: With patient Time For Goal Achievement: 12/09/13 Potential to Achieve Goals: Good ADL Goals Pt Will Perform Upper Body Dressing: with modified independence;sitting Pt Will Perform Lower Body Dressing: with modified independence;sit to/from stand Pt Will Transfer to Toilet: with modified independence;ambulating;regular height toilet Pt Will Perform Toileting - Clothing Manipulation and hygiene: with modified independence;sit to/from stand Pt Will Perform Tub/Shower Transfer: Tub transfer;with supervision;ambulating Additional ADL Goal #1: Pt will perform bed mobility at mod I level as precursor for EOB ADLs.  Visit Information  Last OT Received On: 12/02/13 Assistance Needed: +1 History of Present Illness: s/p extensive aorta-bifemoral bypass graft. Post-op (and post-PT eval) Rt frontal CVA    Subjective Data      Prior Functioning       Cognition  Cognition Arousal/Alertness: Awake/alert Behavior During Therapy: WFL for tasks assessed/performed Overall Cognitive Status: Within Functional Limits for tasks assessed Area of Impairment: Attention;Safety/judgement;Awareness;Problem solving Current Attention Level: Selective Safety/Judgement: Decreased awareness of safety Awareness: Intellectual Problem Solving: Slow processing;Difficulty sequencing;Requires verbal cues General Comments: son present and feels pt is approaching baseline    Mobility  Bed Mobility Overal bed mobility: Needs Assistance Bed Mobility: Supine to Sit Supine to sit: Mod assist General bed mobility comments: Pt did not follow cues to roll to S/L prior to attempting trunk elevation and transition to EOB. Pt required assist to come to full sitting, however anticipate he would not have needed as much assistance if he transitioned from supine to sidelying and then to sitting.  Transfers Overall transfer level: Needs assistance Equipment used: Rolling walker (2 wheeled) Transfers: Sit to/from  Stand Sit to Stand: [place]  transfer comment: Pt unwilling to follow cueing for safe transfer to standing, and pulled up on walker to stand. Therapist assist to keep walker from tipping as pt pulled up.     Exercises      Balance Balance Overall balance assessment: Needs assistance Sitting-balance support: Feet supported;No upper extremity supported Sitting balance-Leahy Scale: Good Standing balance support: Bilateral upper extremity supported Standing balance-Leahy Scale: Fair  End of Session OT - End of Session Activity Tolerance: Patient tolerated treatment well Patient left: in bed;with call bell/phone within reach;with family/visitor present Nurse Communication: Mobility status;Patient requests pain meds  GO     Ghazi Rumpf, Ellard Artis M 12/02/2013, 4:15 PM

## 2013-12-02 NOTE — Progress Notes (Signed)
As per our note 11/27/13, we continue to recommend home with [facility]. [phone]

## 2013-12-02 NOTE — Progress Notes (Signed)
ANTICOAGULATION CONSULT NOTE - Follow Up Consult  Pharmacy Consult for Heparin  Indication: DVT  Allergies  Allergen Reactions  . Morphine And Related Other (See Comments)    "goes crazy"    Patient Measurements: Height: 5\' 6"  (167.6 cm) Weight: 150 lb 12.7 oz (68.4 kg) IBW/kg (Calculated) : 63.8 Heparin dosing weight: 68kg  Vital Signs: Temp: 97.8 F (36.6 C) (01/12 1327) Temp src: Oral (01/12 1327) BP: 148/69 mmHg (01/12 1327) Pulse Rate: 74 (01/12 1327)  Labs:  Recent Labs  11/30/13 0200  12/01/13 0400 12/02/13 0458 12/02/13 1400  HGB 7.6*  --  7.3* 9.5*  --   HCT 21.8*  --  20.8* 26.6*  --   PLT 191  --  249 269  --   HEPARINUNFRC 0.29*  < > 0.38 0.17* 0.36  CREATININE  --   --  0.71 0.77  --   < > = values in this interval not displayed. Estimated Creatinine Clearance: 76.4 ml/min (by C-G formula based on Cr of 0.77).  Medications:  Heparin 1550 units/hr  Assessment: 73 y/o M on heparin for LLE DVT. Noted acute embolic infarct (lower heparin goal). Needs to change to oral agent (likely Xarelto per neuro recommendations) prior to DC. Heparin level was low this morning and rate was increased- now therapeutic at 0.36 units/mL. CBC stable. No bleeding noted  Goal of Therapy:  Heparin level 0.3-0.5 units/ml Monitor platelets by anticoagulation protocol: Yes   Plan:  1. Continue heparin drip at 1550 units/hr 2. Confirmatory level in 8 hours 3. Daily CBC/HL 4. Monitor for bleeding 5. F/U change to Xarelto  Alexsis Kathman D. Teniyah Seivert, PharmD, BCPS Clinical Pharmacist Pager: [phone] 12/02/2013 4:26 PM

## 2013-12-02 NOTE — Progress Notes (Signed)
Physical Therapy Treatment Patient Details Name: Perry Kennedy MRN: [id number] DOB: 1941/06/15 Today's Date: 12/02/2013 Time: 7672-0947 Perry Kennedy Time Calculation (min): 20 min  Perry Kennedy Assessment / Plan / Recommendation  History of Present Illness s/p extensive aorta-bifemoral bypass graft. Post-op (and post-Perry Kennedy eval) Rt frontal CVA   Perry Kennedy Comments   Perry Kennedy progressing towards physical therapy goals. At times, unwilling to make corrective changes for safety or ease of activity, and continues to require occasional cueing to avoid objects in the hallway. Perry Kennedy did not verbalize much during the session, and overall rehab effort was fair to good.  Follow Up Recommendations  CIR     Does the patient have the potential to tolerate intense rehabilitation     Barriers to Discharge        Equipment Recommendations  Rolling walker with 5" wheels    Recommendations for Other Services Rehab consult  Frequency Min 3X/week   Progress towards Perry Kennedy Goals Progress towards Perry Kennedy goals: Progressing toward goals  Plan Discharge plan needs to be updated    Precautions / Restrictions Precautions Precautions: Fall Restrictions Weight Bearing Restrictions: No   Pertinent Vitals/Pain When asked about pain, Perry Kennedy points to right side of neck and ear. When asked to elaborate, Perry Kennedy changed the subject.     Mobility  Bed Mobility Overal bed mobility: Needs Assistance Bed Mobility: Supine to Sit Supine to sit: Mod assist General bed mobility comments: Perry Kennedy did not follow cues to roll to S/L prior to attempting trunk elevation and transition to EOB. Perry Kennedy required assist to come to full sitting, however anticipate he would not have needed as much assistance if he transitioned from supine to sidelying and then to sitting.  Transfers Overall transfer level: Needs assistance Equipment used: Rolling walker (2 wheeled) Transfers: Sit to/from Stand Sit to Stand: Min assist General transfer comment: Perry Kennedy unwilling to follow cueing for safe  transfer to standing, and pulled up on walker to stand. Therapist assist to keep walker from tipping as Perry Kennedy pulled up.  Ambulation/Gait Ambulation/Gait assistance: Min assist Ambulation Distance (Feet): 300 Feet Assistive device: Rolling walker (2 wheeled) Gait Pattern/deviations: Step-through pattern;Decreased stride length;Drifts right/left Gait velocity: Decreased Gait velocity interpretation: Below normal speed for age/gender General Gait Details: Perry Kennedy requires frequent cueing for safe walker placement close to body, and occasional cueing to avoid running into objects in hallway.  Modified Rankin (Stroke Patients Only) Pre-Morbid Rankin Score: No symptoms Modified Rankin: Moderate disability    Exercises     Perry Kennedy Diagnosis:    Perry Kennedy Problem List:   Perry Kennedy Treatment Interventions:     Perry Kennedy Goals (current goals can now be found in the care plan section) Acute Rehab Perry Kennedy Goals Patient Stated Goal: return home to his dog Perry Kennedy Goal Formulation: With patient Time For Goal Achievement: 12/07/13 Potential to Achieve Goals: Good  Visit Information  Last Perry Kennedy Received On: 12/02/13 Assistance Needed: +1 History of Present Illness: s/p extensive aorta-bifemoral bypass graft. Post-op (and post-Perry Kennedy eval) Rt frontal CVA    Subjective Data  Subjective: Perry Kennedy did not verbalize much during session but was overall willing to mobilize limited amounts with therapy.  Patient Stated Goal: return home to his dog   Cognition  Cognition Arousal/Alertness: Awake/alert Behavior During Therapy: Flat affect Overall Cognitive Status: Impaired/Different from baseline Area of Impairment: Attention;Safety/judgement;Awareness;Problem solving Current Attention Level: Selective Safety/Judgement: Decreased awareness of safety Awareness: Intellectual Problem Solving: Slow processing;Difficulty sequencing;Requires verbal cues    Balance  Balance Overall balance assessment: Needs assistance Sitting-balance support: Feet  supported;No upper extremity supported Sitting balance-Leahy Scale: Good Standing balance support: Bilateral upper extremity supported Standing balance-Leahy Scale: Fair  End of Session Perry Kennedy - End of Session Equipment Utilized During Treatment: Gait belt Activity Tolerance: Patient tolerated treatment well Patient left: in chair;with call bell/phone within reach;with family/visitor present Nurse Communication: Mobility status   GP     Jolyn Lent 12/02/2013, 1:33 PM  Jolyn Lent, [place], DPT [phone]

## 2013-12-02 NOTE — Progress Notes (Addendum)
I attempted to talk to patient about rehab again. He was noted to be depressed, did not make eye contact  and non committal about rehab. Currently focused on abdominal distension, malaise and constipation.  Will follow up in am to see if patient more receptive.

## 2013-12-03 LAB — BASIC METABOLIC PANEL
BUN: 4 mg/dL — AB (ref 6–23)
CO2: 29 meq/L (ref 19–32)
Calcium: 8.4 mg/dL (ref 8.4–10.5)
Chloride: 100 mEq/L (ref 96–112)
Creatinine, Ser: 0.79 mg/dL (ref 0.50–1.35)
GFR calc Af Amer: >90 mL/min (ref >90)
GFR, EST NON AFRICAN AMERICAN: 88 mL/min — AB (ref >90)
Glucose, Bld: 154 mg/dL — ABNORMAL HIGH (ref 70–99)
Potassium: 3.9 mEq/L (ref 3.7–5.3)
Sodium: 140 mEq/L (ref 137–147)

## 2013-12-03 LAB — HEPARIN LEVEL (UNFRACTIONATED)
HEPARIN UNFRACTIONATED: 0.15 [IU]/mL — AB (ref 0.30–0.70)
HEPARIN UNFRACTIONATED: 0.28 [IU]/mL — AB (ref 0.30–0.70)
HEPARIN UNFRACTIONATED: 0.45 [IU]/mL (ref 0.30–0.70)

## 2013-12-03 LAB — GLUCOSE, CAPILLARY
GLUCOSE-CAPILLARY: 219 mg/dL — AB (ref 70–99)
Glucose-Capillary: 141 mg/dL — ABNORMAL HIGH (ref 70–99)
Glucose-Capillary: 141 mg/dL — ABNORMAL HIGH (ref 70–99)
Glucose-Capillary: 143 mg/dL — ABNORMAL HIGH (ref 70–99)
Glucose-Capillary: 134 mg/dL — ABNORMAL HIGH (ref 70–99)
Glucose-Capillary: 203 mg/dL — ABNORMAL HIGH (ref 70–99)

## 2013-12-03 LAB — CBC
HEMATOCRIT: 30.2 % — AB (ref 39.0–52.0)
HEMOGLOBIN: 10.4 g/dL — AB (ref 13.0–17.0)
MCH: 29.5 pg (ref 26.0–34.0)
MCHC: 34.4 g/dL (ref 30.0–36.0)
MCV: 85.8 fL (ref 78.0–100.0)
Platelets: 331 10*3/uL (ref 150–400)
RBC: 3.52 MIL/uL — ABNORMAL LOW (ref 4.22–5.81)
RDW: 15.9 % — ABNORMAL HIGH (ref 11.5–15.5)
WBC: 6.3 10*3/uL (ref 4.0–10.5)

## 2013-12-03 MED ORDER — DOCUSATE SODIUM 100 MG PO CAPS
100.0000 mg | ORAL_CAPSULE | Freq: Every day | ORAL | Status: DC
  Administered 2013-12-03 – 2013-12-04 (×2): 100 mg via ORAL
  Filled 2013-12-03 (×3): qty 1

## 2013-12-03 MED ORDER — PANTOPRAZOLE SODIUM 40 MG PO TBEC
40.0000 mg | DELAYED_RELEASE_TABLET | Freq: Every day | ORAL | Status: DC
  Administered 2013-12-03 – 2013-12-04 (×2): 40 mg via ORAL
  Filled 2013-12-03 (×2): qty 1

## 2013-12-03 NOTE — Progress Notes (Addendum)
[facility]   Subjective  - He is very unhappy and wants to go home.  He feels trapped here.   Objective 164/84 71 98.9 F (37.2 C) (Oral) 18 94%  Intake/Output Summary (Last 24 hours) at 12/03/13 0939 Last data filed at 12/03/13 0745  Gross per 24 hour  Intake 828.69 ml  Output   2800 ml  Net -1971.31 ml   Doppler singles DP/PT  Less edema left greater than right LE  Incisions clean and dry healing well. Positive BS.  Left facial droop     Assessment/Planning: POD #11 Aortobifemoral bypass graft with 14 x 7 dacryon graft  #2: Extensive right common femoral and profunda femoral endarterectomy  #3: Aorta to superior mesenteric artery bypass graft with 7 mm dacryon  #4: Reimplantation of the inferior mesenteric artery  CIR will try to talk to him again. 2 units PRBC transfused HGB stable K+ stable Patient will be referred to the outpatient psychiatric services when he was medically stable Continue heparin until D/C    Theda Sers [facility] 12/03/2013 9:39 AM --  Laboratory Lab Results:  Recent Labs  12/02/13 0458 12/03/13 0755  WBC 5.5 6.3  HGB 9.5* 10.4*  HCT 26.6* 30.2*  PLT 269 331   BMET  Recent Labs  12/02/13 0458 12/03/13 0755  NA 138 140  K 3.7 3.9  CL 98 100  CO2 29 29  GLUCOSE 164* 154*  BUN 6 4*  CREATININE 0.77 0.79  CALCIUM 8.2* 8.4    COAG Lab Results  Component Value Date   INR 1.20 11/24/2013   INR 1.48 11/22/2013   INR 1.39 11/22/2013   No results found for this basename: PTT      I agree with the above Emphasized importance of CIR Had stool this am Incisions all ok  Annamarie Major

## 2013-12-03 NOTE — Evaluation (Signed)
Clinical/Bedside Swallow Evaluation Patient Details  Name: [name] MRN: [id number] Date of Birth: November 08, 1941  Today's Date: 12/03/2013 Time: 0865-7846 SLP Time Calculation (min): 12 min  Past Medical History:  Past Medical History  Diagnosis Date  . Hyperlipidemia   . GERD (gastroesophageal reflux disease)   . Hypertension   . COPD (chronic obstructive pulmonary disease)   . Insomnia   . Gout   . DVT (deep venous thrombosis)   . Peripheral vascular disease   . Diabetes mellitus without complication   . Headache(784.0)     migranes- not in a few years  . Coronary artery disease    Past Surgical History:  Past Surgical History  Procedure Laterality Date  . Hemicolectomy    . Aorta - bilateral femoral artery bypass graft N/A 11/22/2013    Procedure: AORTA BIFEMORAL BYPASS GRAFT; SUPERIOR MESENTERIC ARTERY BYPASS, INFERIOR MESENTERIC BYPASS;  Surgeon: Serafina Mitchell, MD;  Location: [facility];  Service: Vascular;  Laterality: N/A;   HPI:  Perry Kennedy is an 73 y.o. male with a past medical history significant for HTN, hyperlipidemia, DM, CAD, PVD, COPD, gout, s/p extensive aorto-bifemoral bypass graft on 11/22/13, with acute onset of the left face weakness and dysarthria post-op. MRI reveals acute right frontal lobe infarct with small amt of petechial hemorrhage. Evaluated by SLP and placed on a dysphagia 1 diet with thin liquids 1/6, advanced by SLP on 1/9 to mechanical soft diet, thin liquids and all further SLP needs deferred to next venue of care (none for dysphagia). New consult received. Per MD notes patient "swallowing well, upon assessing patient prior to giving meds and after giving meds, patient was able to swallow on command, cough on command, after administering meds, patient did not cough, did not have trouble swallowing, however patient "feels that he isnt swallowing well enough and because of that he is scared, he does not feel like eating"."   Assessment / Plan /  Recommendation Clinical Impression  Pt presents with similar swallowing characteristics as noted during 1/9 SLP visit.  Has persisting mild deficits in mastication with mild buccal residue on left, to which pt is attentive.  However, mastication is functional; swallow response is timely; there are no s/s of aspiration present.  Pt states that some days "his swallowing is better than others" and that today "is a good day."  He verbalizes his anticipation of D/C, wanting to eat familiar foods, and craving a cigarette.  States that he worries that he may "choke."  We discussed the improvements in his swallow overall and the protective nature of his cough.    Recommend continuing current dysphagia 3 diet (secondary to dentition) and thin liquids.   No further acute SLP needs are identified.   Pt agrees       Diet Recommendation Dysphagia 3 (Mechanical Soft);Thin liquid   Liquid Administration via: Straw;Cup Medication Administration: Whole meds with puree Supervision: Patient able to self feed Compensations: Slow rate;Small sips/bites;Check for pocketing Postural Changes and/or Swallow Maneuvers: Seated upright 90 degrees;Upright 30-60 min after meal    Other  Recommendations Oral Care Recommendations: Oral care BID   Follow Up Recommendations  Home health SLP to address dysarthria, cognition    Swallow Study Prior Functional Status       General HPI: Perry Kennedy is an 73 y.o. male with a past medical history significant for HTN, hyperlipidemia, DM, CAD, PVD, COPD, gout, s/p extensive aorto-bifemoral bypass graft on 11/22/13, with acute onset of the left  face weakness and dysarthria post-op. MRI reveals acute right frontal lobe infarct with small amt of petechial hemorrhage. Evaluated by SLP and placed on a dysphagia 1 diet with thin liquids 1/6, advanced by SLP on 1/9 to mechanical soft diet, thin liquids and all further SLP needs deferred to next venue of care (none for dysphagia). New consult  received. Per MD notes patient "swallowing well, upon assessing patient prior to giving meds and after giving meds, patient was able to swallow on command, cough on command, after administering meds, patient did not cough, did not have trouble swallowing, however patient "feels that he isnt swallowing well enough and because of that he is scared, he does not feel like eating"." Type of Study: Bedside swallow evaluation Previous Swallow Assessment: see HPI Diet Prior to this Study: Dysphagia 3 (soft);Thin liquids Temperature Spikes Noted: No Respiratory Status: Room air Behavior/Cognition: Alert;Cooperative Oral Cavity - Dentition: Missing dentition Self-Feeding Abilities: Able to feed self Patient Positioning: Upright in bed Baseline Vocal Quality: Clear Volitional Cough: Strong Volitional Swallow: Able to elicit    Oral/Motor/Sensory Function Overall Oral Motor/Sensory Function:  (see prior eval) left CN VII deficits  Ice Chips Ice chips: Within functional limits   Thin Liquid Thin Liquid: Within functional limits Presentation: Cup;Straw    Nectar Thick Nectar Thick Liquid: Within functional limits Presentation: Cup   Honey Thick Honey Thick Liquid: Not tested   Puree Puree: Within functional limits Presentation: Self Fed;Spoon   Solid  Makylie Rivere L. [place], [place] CCC/SLP Pager [phone]     Solid: Within functional limits Presentation: Self Fed       Juan Quam Laurice 12/03/2013,11:16 AM

## 2013-12-03 NOTE — Progress Notes (Signed)
[facility] Progress Note  12/03/2013 9:05 AM POD 11  Subjective:  Pt says he doesn't feel good and needs to have BM.  Tmax 99.2 now 10.2 BP systolic [id number] HR 03K regular O2 94%RA  Filed Vitals:   12/03/13 0545  BP: 164/84  Pulse:   Temp:   Resp:     Physical Exam: Cardiac:  Regular rate and rhythm Lungs:  nonlabored  Abdomen:  Positive BS, slightly distended, soft and nontender, denies nausea, negative for BM past two days Incisions:  Clean, dry and intact midline abdominal and bilateral groins incisions Extremities:  Lower extremity edema L>R, DP/PT pulses non palpable. Heard doppler signals right and left PT/DP.  Neuro: left sided facial droop with slurred speech, grip strength equal, equal movement lower extremities  CBC    Component Value Date/Time   WBC 6.3 12/03/2013 0755   WBC 10.0 10/23/2013 1527   WBC 13.8* 10/03/2013 1632   RBC 3.52* 12/03/2013 0755   RBC 5.01 10/23/2013 1527   RBC 5.7 10/03/2013 1632   HGB 10.4* 12/03/2013 0755   HGB 15.6 10/03/2013 1632   HCT 30.2* 12/03/2013 0755   HCT 47.4 10/03/2013 1632   PLT 331 12/03/2013 0755   MCV 85.8 12/03/2013 0755   MCV 83.3 10/03/2013 1632   MCH 29.5 12/03/2013 0755   MCH 29.1 10/23/2013 1527   MCH 27.5 10/03/2013 1632   MCHC 34.4 12/03/2013 0755   MCHC 35.0 10/23/2013 1527   MCHC 33.0 10/03/2013 1632   RDW 15.9* 12/03/2013 0755   RDW 15.1 10/23/2013 1527   LYMPHSABS 1.7 10/23/2013 1527   LYMPHSABS 1.3 10/08/2013 1047   MONOABS 0.4 10/08/2013 1047   EOSABS 0.3 10/23/2013 1527   EOSABS 0.3 10/08/2013 1047   BASOSABS 0.0 10/23/2013 1527   BASOSABS 0.0 10/08/2013 1047    BMET    Component Value Date/Time   NA 140 12/03/2013 0755   NA 135 10/23/2013 1527   K 3.9 12/03/2013 0755   CL 100 12/03/2013 0755   CO2 29 12/03/2013 0755   GLUCOSE 154* 12/03/2013 0755   GLUCOSE 123* 10/23/2013 1527   BUN 4* 12/03/2013 0755   BUN 11 10/23/2013 1527   CREATININE 0.79 12/03/2013 0755   CALCIUM 8.4  12/03/2013 0755   GFRNONAA 88* 12/03/2013 0755   GFRAA >90 12/03/2013 0755    INR    Component Value Date/Time   INR 1.20 11/24/2013 0802     Intake/Output Summary (Last 24 hours) at 12/03/13 0905 Last data filed at 12/03/13 0745  Gross per 24 hour  Intake 828.69 ml  Output   2800 ml  Net -1971.31 ml     Assessment/Plan:  73 y.o. male is s/p  POD 11 #1: Aortobifemoral bypass graft with 14 x 7 dacryon graft  #2: Extensive right common femoral and profunda femoral endarterectomy  #3: Aorta to superior mesenteric artery bypass graft with 7 mm dacryon  #4: Reimplantation of the inferior mesenteric artery  -Acute surgical blood loss anemia improved after 1 unit of PRBCs yesterday, Hgb 10.4 today -CIR to reevaluate patient today -DVT prophylaxis: heparin to be converted to xarelto before discharge -Encourage mobilization  -Poor food intake, decreased appetite on dysphagia diet. To get swallow evaluation today.  -Dulcolax suppository given today for lack of BM.  -Neuro exam unchanged  Virgina Jock, PA-S Leontine Locket, PA-C [facility] [phone] 12/03/2013 9:05 AM

## 2013-12-03 NOTE — Progress Notes (Signed)
ANTICOAGULATION CONSULT NOTE - Follow Up Consult  Pharmacy Consult for Heparin  Indication: DVT  Allergies  Allergen Reactions  . Morphine And Related Other (See Comments)    "goes crazy"    Patient Measurements: Height: 5\' 6"  (167.6 cm) Weight: 150 lb 12.7 oz (68.4 kg) IBW/kg (Calculated) : 63.8 Heparin dosing weight: 68kg  Vital Signs: Temp: 98.9 F (37.2 C) (01/13 0339) Temp src: Oral (01/13 0339) BP: 164/84 mmHg (01/13 0545) Pulse Rate: 71 (01/13 0339)  Labs:  Recent Labs  12/01/13 0400 12/02/13 0458 12/02/13 1400 12/02/13 2350 12/03/13 0755  HGB 7.3* 9.5*  --   --  10.4*  HCT 20.8* 26.6*  --   --  30.2*  PLT 249 269  --   --  331  HEPARINUNFRC 0.38 0.17* 0.36 0.28* 0.15*  CREATININE 0.71 0.77  --   --  0.79   Estimated Creatinine Clearance: 76.4 ml/min (by C-G formula based on Cr of 0.79).  Medications:  Heparin @ 1650 units/hr  Assessment: 73 y/o M on heparin for LLE DVT. After a rate increase and one therapeutic level yesterday, his heparin level dropped last evening. He was increased again to 1650 units/hr and a level drawn this morning was low again. Spoke with RN Roselyn Reef- drip was not held overnight, is currently running at the ordered rate, and there have been no issues with the line. Timing of labs correct. Neuro recommended switching to Xarelto prior to discharge, not yet ordered.  Goal of Therapy:  Heparin level 0.3-0.5 units/ml Monitor platelets by anticoagulation protocol: Yes   Plan:  1. Increase heparin drip to 1800 units/hr 2. Heparin level in 8 hours 3. Daily CBC/HL 4. Monitor for bleeding 5. F/U change to Xarelto  Raeann Offner D. Philopateer Strine, PharmD, BCPS Clinical Pharmacist Pager: [phone] 12/03/2013 9:12 AM

## 2013-12-03 NOTE — Progress Notes (Signed)
[facility] for Heparin  Indication: DVT  Allergies  Allergen Reactions  . Morphine And Related Other (See Comments)    "goes crazy"    Patient Measurements: Height: 5\' 6"  (167.6 cm) Weight: 150 lb 12.7 oz (68.4 kg) IBW/kg (Calculated) : 63.8 Heparin dosing weight: 68kg  Vital Signs: Temp: 99.2 F (37.3 C) (01/12 2024) Temp src: Oral (01/12 2024) BP: 171/81 mmHg (01/12 2024) Pulse Rate: 76 (01/12 2024)  Labs:  Recent Labs  11/30/13 0200  12/01/13 0400 12/02/13 0458 12/02/13 1400 12/02/13 2350  HGB 7.6*  --  7.3* 9.5*  --   --   HCT 21.8*  --  20.8* 26.6*  --   --   PLT 191  --  249 269  --   --   HEPARINUNFRC 0.29*  < > 0.38 0.17* 0.36 0.28*  CREATININE  --   --  0.71 0.77  --   --   < > = values in this interval not displayed. Estimated Creatinine Clearance: 76.4 ml/min (by C-G formula based on Cr of 0.77).  Assessment: 73 yo male with DVT for heparin  Goal of Therapy:  Heparin level 0.3-0.5 units/ml Monitor platelets by anticoagulation protocol: Yes   Plan:  Increase Heparin 1650 units/hr Follow-up am labs.  Phillis Knack, PharmD, BCPS  12/03/2013 12:25 AM

## 2013-12-03 NOTE — Progress Notes (Signed)
Follow up visit--patient looks better and making eye contact. Still has discomfort due to constipation and abdominal distension. He feels that he'll do better once he's home. Recommend ambulation bid while on unit.  Will defer consult.

## 2013-12-03 NOTE — Progress Notes (Signed)
ANTICOAGULATION CONSULT NOTE - Follow Up Consult  Pharmacy Consult for heparin Indication: DVT  Allergies  Allergen Reactions  . Morphine And Related Other (See Comments)    "goes crazy"    Patient Measurements: Height: 5\' 6"  (167.6 cm) Weight: 150 lb 12.7 oz (68.4 kg) IBW/kg (Calculated) : 63.8 Heparin Dosing Weight: 68 kg  Vital Signs: Temp: 98.1 F (36.7 C) (01/13 1300) Temp src: Oral (01/13 1300) BP: 155/69 mmHg (01/13 1300) Pulse Rate: 74 (01/13 1300)  Labs:  Recent Labs  12/01/13 0400 12/02/13 0458 12/02/13 1400 12/02/13 2350 12/03/13 0755  HGB 7.3* 9.5*  --   --  10.4*  HCT 20.8* 26.6*  --   --  30.2*  PLT 249 269  --   --  331  HEPARINUNFRC 0.38 0.17* 0.36 0.28* 0.15*  CREATININE 0.71 0.77  --   --  0.79    Estimated Creatinine Clearance: 76.4 ml/min (by C-G formula based on Cr of 0.79).   Medications:  Scheduled:  . aspirin  325 mg Oral Daily  . atorvastatin  40 mg Oral Daily  . docusate sodium  100 mg Oral Daily  . feeding supplement (RESOURCE BREEZE)  1 Container Oral BID WC  . insulin aspart  1-3 Units Subcutaneous Q4H  . insulin glargine  15 Units Subcutaneous Q24H  . pantoprazole  40 mg Oral Q1200   Infusions:  . sodium chloride 20 mL/hr at 11/29/13 0700  . heparin 1,800 Units/hr (12/03/13 1154)  . lactated ringers 20 mL/hr at 11/22/13 0832  . lactated ringers 50 mL/hr at 11/22/13 1119    Assessment: 73 yo male with DVT is currently on therapeutic heparin. Heparin level was 0.45. Goal of Therapy:  Heparin level 0.3-0.5 units/ml Monitor platelets by anticoagulation protocol: Yes   Plan:  1) Continue heparin at 1800 units/hr 2) Daily heparin level and CBC  Kashmir Lysaght, Tsz-Yin 12/03/2013,5:33 PM

## 2013-12-03 NOTE — Progress Notes (Signed)
Last BM 1-10; pt given given Dulcolax supp at this time; will cont. To monitor.

## 2013-12-04 ENCOUNTER — Telehealth: Payer: Self-pay | Admitting: Surgery

## 2013-12-04 LAB — CBC
HCT: 33.3 % — ABNORMAL LOW (ref 39.0–52.0)
HEMOGLOBIN: 11.3 g/dL — AB (ref 13.0–17.0)
MCH: 29.7 pg (ref 26.0–34.0)
MCHC: 33.9 g/dL (ref 30.0–36.0)
MCV: 87.4 fL (ref 78.0–100.0)
Platelets: 333 10*3/uL (ref 150–400)
RBC: 3.81 MIL/uL — ABNORMAL LOW (ref 4.22–5.81)
RDW: 16 % — ABNORMAL HIGH (ref 11.5–15.5)
WBC: 7.3 10*3/uL (ref 4.0–10.5)

## 2013-12-04 LAB — GLUCOSE, CAPILLARY
Glucose-Capillary: 123 mg/dL — ABNORMAL HIGH (ref 70–99)
Glucose-Capillary: 140 mg/dL — ABNORMAL HIGH (ref 70–99)
Glucose-Capillary: 140 mg/dL — ABNORMAL HIGH (ref 70–99)
Glucose-Capillary: 99 mg/dL (ref 70–99)

## 2013-12-04 LAB — HEPARIN LEVEL (UNFRACTIONATED): HEPARIN UNFRACTIONATED: 0.54 [IU]/mL (ref 0.30–0.70)

## 2013-12-04 MED ORDER — RIVAROXABAN 15 MG PO TABS: 15.0000 mg | ORAL_TABLET | Freq: Two times a day (BID) | ORAL | Status: DC

## 2013-12-04 MED ORDER — RIVAROXABAN 20 MG PO TABS: 20.0000 mg | ORAL_TABLET | Freq: Every day | ORAL | Status: DC

## 2013-12-04 MED ORDER — RIVAROXABAN 20 MG PO TABS
20.0000 mg | ORAL_TABLET | Freq: Every day | ORAL | Status: DC
Start: 2013-12-04 — End: 2013-12-04

## 2013-12-04 MED ORDER — OXYCODONE-ACETAMINOPHEN 5-325 MG PO TABS: 1.0000 | ORAL_TABLET | ORAL | Status: DC | PRN

## 2013-12-04 MED ORDER — RIVAROXABAN 15 MG PO TABS
15.0000 mg | ORAL_TABLET | Freq: Two times a day (BID) | ORAL | Status: DC
  Administered 2013-12-04: 15 mg via ORAL
  Filled 2013-12-04 (×3): qty 1

## 2013-12-04 MED ORDER — MAGNESIUM CITRATE PO SOLN
0.5000 | Freq: Once | ORAL | Status: AC
  Administered 2013-12-04: 0.5 via ORAL
  Filled 2013-12-04 (×2): qty 296

## 2013-12-04 NOTE — Discharge Summary (Signed)
[facility] Discharge Summary  Perry Kennedy 18-Nov-1941 73 y.o. male  [id number]  Admission Date: 11/22/2013  Discharge Date: 12/04/13  Physician: Serafina Mitchell, MD  Admission Diagnosis: Peripheral Vascular Disease PVD   HPI:   This is a 73 y.o. male who is here today for violation of lower extremity peripheral vascular disease. The patient recently underwent angiography which revealed an occluded left iliac system and a highly diseased right iliac system. He also has an occluded left superficial femoral artery. He has significant limitations with his activity. He also complains of pain in his left hip. He states that after he she also has significant pain in his abdomen. He will also abdominal pain when he does not deep.  The patient has significant coronary disease. He recently underwent cardiac catheterization and was cleared for surgery.  The patient suffers from hypercholesterolemia and is managed with a stent. He is on an ACE inhibitor for his hypertension. He has COPD and continues to smoke was no desire to quit.  Hospital Course:  The patient was admitted to the hospital and taken to the [facility] on 11/22/2013 and underwent: Aortogram, mesenteric angiogram by Dr. Oneida Alar and then taken to the operating room and underwent: #1: Aortobifemoral bypass graft with 14 x 7 dacryon graft  #2: Extensive right common femoral and profunda femoral endarterectomy  #3: Aorta to superior mesenteric artery bypass graft with 7 mm dacryon  #4: Reimplantation of the inferior mesenteric artery    The pt tolerated the procedure well and was transported to the PACU in good condition.  A CCM consult was obtained for medical management as the pt was volume overloaded, hyperkalemic, DM, and low urine output.  Also post operatively, he did receive an insulin gtt for hyperglycemia.  His metformin was held due to NPO.  On the day of POD 1, he had left brain stroke with slurred speech  and facial droop with no significant motor weakness.  MRI reveals acute right frontal lobe infarct with small amt of petechial hemorrhage.  His echo was negative for cardiac source with EF of 60-65%.  TEE was also negative.  A lower extremity duplex was performed and was positive for isolated, acute, nonocclusive DVT involving the left common femoral vein.  He was started on a heparin gtt and was converted to Xarelto prior to discharge.  A repeat CT scan was done as the pt was very tearful and crying and complaining of headache on 11/29/13 and results are expected evolution of subacute right frontal cortical infarction and no acute intracranial findings.   His slurred speech appears to be improving by discharge.  Due to inconsolable crying, a psychiatry consult was obtained.  He will be referred to outpatient psychiatric services when medically stable and no recommended changes at that time.  Pre-op carotid dopplers show 40-60% bilateral stenosis.   He had a carotid duplex on 11/24/13, which revealed 1-39% stenosis bilaterally.  Vertebral artery flow is antegrade.  On 11/26/13, he had a swallow evaluation and a dysphagia 1 (puree diet) was recommended.    By POD 7, he was complaining of more abdominal pain, but abdomen remained soft and non distended.  He is passing flatus and having BM's.  His nutrition is poor.  He did get a nutritional consult and resource Breeze was recommended.  A repeat swallow eval was done and his diet was changed to dysphagia 3 diet was recommended.  On POD 12, pt is still complaining of constipation despite  having a BM.  He is prescribed 0.5 bottle of mag citrate.  He did have some LUE pain on POD 5, which was infiltrate from IV.  On POD 9, he did have acute blood loss anemia and received 2 units of PRBC's and continues to improve.  A CIR consult was obtained and the pt was a candidate for CIR, but pt refused and wants to go home.  Pt for home OT/PT.  The remainder of the hospital  course consisted of increasing mobilization and increasing intake of solids without difficulty.  CBC    Component Value Date/Time   WBC 7.3 12/04/2013 0430   WBC 10.0 10/23/2013 1527   WBC 13.8* 10/03/2013 1632   RBC 3.81* 12/04/2013 0430   RBC 5.01 10/23/2013 1527   RBC 5.7 10/03/2013 1632   HGB 11.3* 12/04/2013 0430   HGB 15.6 10/03/2013 1632   HCT 33.3* 12/04/2013 0430   HCT 47.4 10/03/2013 1632   PLT 333 12/04/2013 0430   MCV 87.4 12/04/2013 0430   MCV 83.3 10/03/2013 1632   MCH 29.7 12/04/2013 0430   MCH 29.1 10/23/2013 1527   MCH 27.5 10/03/2013 1632   MCHC 33.9 12/04/2013 0430   MCHC 35.0 10/23/2013 1527   MCHC 33.0 10/03/2013 1632   RDW 16.0* 12/04/2013 0430   RDW 15.1 10/23/2013 1527   LYMPHSABS 1.7 10/23/2013 1527   LYMPHSABS 1.3 10/08/2013 1047   MONOABS 0.4 10/08/2013 1047   EOSABS 0.3 10/23/2013 1527   EOSABS 0.3 10/08/2013 1047   BASOSABS 0.0 10/23/2013 1527   BASOSABS 0.0 10/08/2013 1047    BMET    Component Value Date/Time   NA 140 12/03/2013 0755   NA 135 10/23/2013 1527   K 3.9 12/03/2013 0755   CL 100 12/03/2013 0755   CO2 29 12/03/2013 0755   GLUCOSE 154* 12/03/2013 0755   GLUCOSE 123* 10/23/2013 1527   BUN 4* 12/03/2013 0755   BUN 11 10/23/2013 1527   CREATININE 0.79 12/03/2013 0755   CALCIUM 8.4 12/03/2013 0755   GFRNONAA 88* 12/03/2013 0755   GFRAA >90 12/03/2013 0755     Discharge Instructions:   The patient is discharged to home with extensive instructions on wound care and progressive ambulation.  They are instructed not to drive or perform any heavy lifting until returning to see the physician in his office.  Discharge Orders   Future Orders Complete By Expires   ABDOMINAL PROCEDURE/ANEURYSM REPAIR/AORTO-BIFEMORAL BYPASS:  Call MD for increased abdominal pain; cramping diarrhea; nausea/vomiting  As directed    Call MD for:  redness, tenderness, or signs of infection (pain, swelling, bleeding, redness, odor or green/yellow discharge around incision site)  As  directed    Call MD for:  severe or increased pain, loss or decreased feeling  in affected limb(s)  As directed    Call MD for:  temperature >100.5  As directed    Discharge instructions  As directed    Comments:     Diet:  Liquid Administration via: Straw;Cup Medication Administration: Whole meds with puree Supervision: Patient able to self feed Compensations: Slow rate;Small sips/bites;Check for pocketing Postural Changes and/or Swallow Maneuvers: Seated upright 90 degrees;Upright 30-60 min after meal   Discharge wound care:  As directed    Comments:     Shower daily with soap and water starting 12/04/13   Driving Restrictions  As directed    Comments:     No driving for 2 weeks   Lifting restrictions  As directed  Comments:     No lifting for 4 weeks      Discharge Diagnosis:  Peripheral Vascular Disease PVD  Secondary Diagnosis: Patient Active Problem List   Diagnosis Date Noted  . Acute CVA (cerebrovascular accident) 11/25/2013  . Hyperkalemia 11/23/2013  . Hypomagnesemia 11/23/2013  . Low urine output 11/23/2013  . Volume overload 11/23/2013  . Diabetes 11/23/2013  . 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 COLONIC POLYPS, ADENOMATOUS, HX OF 04/23/2009  . ADENOMATOUS COLONIC POLYP 01/03/2008  . DYSLIPIDEMIA 01/03/2008  . SMOKER 01/03/2008  . HYPERTENSION 01/03/2008  . CAD 01/03/2008  . CAROTID ARTERY DISEASE 01/03/2008  . PERIPHERAL VASCULAR DISEASE 01/03/2008  . INTERNAL HEMORRHOIDS 01/03/2008  . ACID REFLUX DISEASE 01/03/2008  . GASTRITIS 01/03/2008  . DIVERTICULOSIS, SIGMOID COLON 01/03/2008  . LIVER FUNCTION TESTS, ABNORMAL 01/03/2008   Past Medical History  Diagnosis Date  . Hyperlipidemia   . GERD (gastroesophageal reflux disease)   . Hypertension   . COPD (chronic obstructive pulmonary disease)   . Insomnia   . Gout   . DVT (deep venous thrombosis)   . Peripheral vascular disease   .  Diabetes mellitus without complication   . Headache(784.0)     migranes- not in a few years  . Coronary artery disease      Medication List    STOP taking these medications       ciprofloxacin 500 MG tablet  Commonly known as:  CIPRO     metroNIDAZOLE 500 MG tablet  Commonly known as:  FLAGYL      TAKE these medications       aspirin 325 MG tablet  Take 325 mg by mouth daily.     atorvastatin 40 MG tablet  Commonly known as:  LIPITOR  Take 1 tablet (40 mg total) by mouth daily.     cilostazol 100 MG tablet  Commonly known as:  PLETAL  Take 1 tablet (100 mg total) by mouth 2 (two) times daily.     diclofenac 75 MG EC tablet  Commonly known as:  VOLTAREN  Take 1 tablet (75 mg total) by mouth 2 (two) times daily.     lisinopril 20 MG tablet  Commonly known as:  PRINIVIL,ZESTRIL  Take 1 tablet (20 mg total) by mouth daily.     metFORMIN 500 MG tablet  Commonly known as:  GLUCOPHAGE  Take by mouth 2 (two) times daily with a meal.     omeprazole 20 MG capsule  Commonly known as:  PRILOSEC  Take 1 capsule (20 mg total) by mouth daily.     oxyCODONE-acetaminophen 5-325 MG per tablet  Commonly known as:  PERCOCET  Take 1 tablet by mouth every 4 (four) hours as needed for severe pain.     ranitidine 150 MG tablet  Commonly known as:  ZANTAC  Take 1 tablet (150 mg total) by mouth 2 (two) times daily.     Rivaroxaban 15 MG Tabs tablet  Commonly known as:  XARELTO  Take 1 tablet (15 mg total) by mouth 2 (two) times daily with a meal.     Rivaroxaban 20 MG Tabs tablet  Commonly known as:  XARELTO  Take 1 tablet (20 mg total) by mouth daily.  Start taking on:  12/25/2013        Percoet5-325 #30 No Refill  Disposition: home with [facility] PT/OT  Patient's condition: is Good  Follow up: 1. Dr. Trula Slade on 12/16/13 2. Dr. Laurance Flatten within  the next week to evaluate DM medications. 3. Dr. Percival Spanish within 2 weeks.   Leontine Locket, PA-C [facility] [phone] 12/04/2013  9:25 AM   - For VQI Registry use ---   Post-op:  Time to Extubation: [x ] In OR, [ ]  < 12 hrs, [ ]  12-24 hrs, [ ]  >=24 hrs Vasopressors Req. Post-op: No ICU Stay: 8 days Transfusion: Yes  If yes, 2 units given MI: No, [ ]  Troponin only, [ ]  EKG or Clinical New Arrhythmia: No  Complications: CHF: Yes  BNP 237.3 Resp failure: No, [ ]  Pneumonia, [ ]  Ventilator Chg in renal function: No, [ ]  Inc. Cr > 0.5, [ ]  Temp. Dialysis, [ ]  Permanent dialysis Leg ischemia: No, no Surgery needed, [ ]  Yes, Surgery needed, [ ]  Amputation Bowel ischemia: No, [ ]  Medical Rx, [ ]  Surgical Rx Wound complication: No, [ ]  Superficial separation/infection, [ ]  Return to OR Return to OR: No  Return to OR for bleeding: No Stroke: Yes, [ ]  Minor, [ ]  Major (slurred speech and facial droop-slightly improved at discharge).  Discharge medications: Statin use:  Yes If No: [ ]  For Medical reasons, [ ]  Non-compliant ASA use:  Yes  If No: [ ]  For Medical reasons, [ ]  Non-compliant Plavix use:  No If No: [ ]  For Medical reasons, [ ]  Non-compliant Xarelto:  Yes Beta blocker use:  No If No: [ ]  For Medical reasons, [ ]  Non-compliant

## 2013-12-04 NOTE — Progress Notes (Signed)
Physical Therapy Treatment Patient Details Name: Perry Kennedy MRN: [id number] DOB: 07/13/1941 Today's Date: 12/04/2013 Time: 8756-4332 PT Time Calculation (min): 18 min  PT Assessment / Plan / Recommendation  History of Present Illness s/p extensive aorta-bifemoral bypass graft. Post-op (and post-PT eval) Rt frontal CVA   PT Comments   Pt eager to go home although he cannot identify if he will have more than intermittent assist at home. He is not concerned and reports he'll do much better at home where he can walk around more. Pt more receptive to instruction re: safe use of RW and demonstrated ability to use RW correctly. Agrees to HHPT and feel this is indicated for safety eval and continued education re: use of DME.    Follow Up Recommendations  Home health PT;Supervision for mobility/OOB     Does the patient have the potential to tolerate intense rehabilitation     Barriers to Discharge        Equipment Recommendations  Rolling walker with 5" wheels;3in1 (PT) (3in1 for nighttime use (especially if alone))    Recommendations for Other Services    Frequency Min 3X/week   Progress towards PT Goals Progress towards PT goals: Progressing toward goals  Plan Discharge plan needs to be updated    Precautions / Restrictions Precautions Precautions: Fall   Pertinent Vitals/Pain Denied pain    Mobility  Transfers Overall transfer level: Needs assistance Equipment used: Rolling walker (2 wheeled) Transfers: Sit to/from Stand Sit to Stand: Supervision General transfer comment: x3; repeated for education re: safe use of RW; pt required cues only and demonstrated safe technique x 2 Ambulation/Gait Ambulation/Gait assistance: Supervision Ambulation Distance (Feet): 150 Feet Assistive device: Rolling walker (2 wheeled) Gait Pattern/deviations: Step-through pattern;Trunk flexed General Gait Details: continued cues for keeping RW closer to his body; avoiding obstacles even in  crowded room Stairs:  (pt deferred/did not feel he needed to do) Modified Rankin (Stroke Patients Only) Pre-Morbid Rankin Score: No symptoms Modified Rankin: Moderate disability    Exercises     PT Diagnosis:    PT Problem List:   PT Treatment Interventions:     PT Goals (current goals can now be found in the care plan section)    Visit Information  Last PT Received On: 12/04/13 Assistance Needed: +1 History of Present Illness: s/p extensive aorta-bifemoral bypass graft. Post-op (and post-PT eval) Rt frontal CVA    Subjective Data      Cognition  Cognition Arousal/Alertness: Awake/alert Behavior During Therapy: WFL for tasks assessed/performed Overall Cognitive Status: Within Functional Limits for tasks assessed    Balance  General Comments General comments (skin integrity, edema, etc.): Noted plans to d/c home today. Pt spoke with son by phone to determine if he already has a RW at home. (Case Manager to follow-up with family). Pt unsure if he will stay with his son or his son will stay with him on d/c. Agrees to HHPT  End of Session PT - End of Session Activity Tolerance: Patient tolerated treatment well Patient left: in chair;with call bell/phone within reach   GP     Perry Kennedy 12/04/2013, 12:13 PM Pager [phone]

## 2013-12-04 NOTE — Care Management Note (Signed)
    Page 1 of 2   12/04/2013     12:10:00 PM   CARE MANAGEMENT NOTE 12/04/2013  Patient:  Perry Kennedy, Perry Kennedy   Account Number:  [id number]  Date Initiated:  11/25/2013  Documentation initiated by:  [facility]  Subjective/Objective Assessment:   Post op aorta bipass - CVA     Action/Plan:   PTA, PT INDEPENDENT, LIVES ALONE, BUT SON LIVES ACROSS THE ST[address] WITH TEENAGE CHILDREN.   Anticipated DC Date:  12/04/2013   Anticipated DC Plan:  [place]  CM consult      [facility] Choice  HOME HEALTH   Choice offered to / List presented to:  C-4 Adult Children        HH arranged  HH-5 SPEECH THERAPY  HH-2 PT  HH-1 RN  HH-3 OT      [place].   Status of service:  Completed, signed off Medicare Important Message given?   (If response is "NO", the following Medicare IM given date fields will be blank) Date Medicare IM given:   Date Additional Medicare IM given:    Discharge Disposition:  [place]  Per UR Regulation:  Reviewed for med. necessity/level of care/duration of stay  If discussed at [place] of Stay Meetings, dates discussed:   12/03/2013    Comments:  Contact:  Gasser,Kelly Daughter [phone]  12/04/13 Friedrich Harriott,RN,BSN [phone] PT [place]; WILL NEED HH FOLLOW UP AS ARRANGED. NOTIFIED [place].  PT STATES SON, GRANDCHILDREN WILL ASSIST AT DC.  PT THINKS HE HAS RW AT HOME; SON TO CHECK ON THIS.  WILL FOLLOW UP WITH SON FOR DME NEEDS.  11/29/13 1107 [place] RN MSN BSN CCM Pt will need home health PT and ST when discharged. Discussed services with pt who requests CM talk with son. States son lives next door and is not working - can assist him as needed.  TC to son, Nicole Kindred [phone]), who requests CM talk with him when he visits this afternoon. 1200 Discussed home health services with son and provoided list of agencies in [place].  Referral made to [facility]  liaison per choice.  11-27-13 3pm Luz Lex, RNBSN [phone] Talked with patient post TEE. Sitting in chair.  Little eye contact, flat affect.  States does live at home - has daughter and son close to him that assist him.  CM will continue to follow for further needs.  Written results of TEE not back yet.   11-26-13 - 10:30am Luz Lex, RNBSN [phone] PT recommending HH PT on discharge.  Physician will need to order.  CM will continue to follow.

## 2013-12-04 NOTE — Progress Notes (Signed)
Spoke with Sharyn Lull, the DM coordinator about restarting Metformin.  She reviewed the chart and said it was okay for pt to resume Metformin and to inform pt that if Perry is not going to eat, to not take Perry Metformin.  Perry Kennedy, Perry PCP within a week of discharge to evaluate Perry DM medications.  Neithan Day 12/04/2013 10:14 AM

## 2013-12-04 NOTE — Telephone Encounter (Addendum)
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 Trula Slade on 12/16/13.            Thanks,      Aldona Bar ------  12/04/13: lm for pt with pts wife, dpm

## 2013-12-04 NOTE — Progress Notes (Addendum)
[facility] Progress Note  12/04/2013 7:51 AM 12 Days Post-Op  Subjective:  Pt wants to go home; states he has not had a BM-spoke with RN-last BM was yesterday.  Condom cath placed this am.  Afebrile VSS 94% RA  Filed Vitals:   12/04/13 0547  BP: 145/69  Pulse:   Temp:   Resp:     Physical Exam: Cardiac:  regular Lungs:  Non labored Abdomen:  Soft non tender to palpation; decreased BS; -N/V Incisions:  C/d/i Extremities:  + edema BLE right > left Neuro:  Speech improved this am; still with left facial droop.  CBC    Component Value Date/Time   WBC 7.3 12/04/2013 0430   WBC 10.0 10/23/2013 1527   WBC 13.8* 10/03/2013 1632   RBC 3.81* 12/04/2013 0430   RBC 5.01 10/23/2013 1527   RBC 5.7 10/03/2013 1632   HGB 11.3* 12/04/2013 0430   HGB 15.6 10/03/2013 1632   HCT 33.3* 12/04/2013 0430   HCT 47.4 10/03/2013 1632   PLT 333 12/04/2013 0430   MCV 87.4 12/04/2013 0430   MCV 83.3 10/03/2013 1632   MCH 29.7 12/04/2013 0430   MCH 29.1 10/23/2013 1527   MCH 27.5 10/03/2013 1632   MCHC 33.9 12/04/2013 0430   MCHC 35.0 10/23/2013 1527   MCHC 33.0 10/03/2013 1632   RDW 16.0* 12/04/2013 0430   RDW 15.1 10/23/2013 1527   LYMPHSABS 1.7 10/23/2013 1527   LYMPHSABS 1.3 10/08/2013 1047   MONOABS 0.4 10/08/2013 1047   EOSABS 0.3 10/23/2013 1527   EOSABS 0.3 10/08/2013 1047   BASOSABS 0.0 10/23/2013 1527   BASOSABS 0.0 10/08/2013 1047    BMET    Component Value Date/Time   NA 140 12/03/2013 0755   NA 135 10/23/2013 1527   K 3.9 12/03/2013 0755   CL 100 12/03/2013 0755   CO2 29 12/03/2013 0755   GLUCOSE 154* 12/03/2013 0755   GLUCOSE 123* 10/23/2013 1527   BUN 4* 12/03/2013 0755   BUN 11 10/23/2013 1527   CREATININE 0.79 12/03/2013 0755   CALCIUM 8.4 12/03/2013 0755   GFRNONAA 88* 12/03/2013 0755   GFRAA >90 12/03/2013 0755    INR    Component Value Date/Time   INR 1.20 11/24/2013 0802     Intake/Output Summary (Last 24 hours) at 12/04/13 0751 Last data filed at  12/04/13 0600  Gross per 24 hour  Intake 1198.25 ml  Output   1426 ml  Net -227.75 ml     Assessment/Plan:  73 y.o. 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 -condom cath placed this am due to frequency   Leontine Locket, PA-C [facility] [phone] 12/04/2013 7:51 AM  Addendum  I have independently interviewed and examined the patient, and I agree with the physician assistant's findings.  Appears patient is not a candidate  for CIR.  Pt prefers to go home.  Home PT/OT.  Switch heparin drip to Xarelto.  Home once home service arranged.  Adele Barthel, MD [facility] [place] Office: [phone] Pager: [phone]  12/04/2013, 8:58 AM

## 2013-12-04 NOTE — Significant Event (Signed)
Discharge instructions and education done with patient again along with his son and ex-spouse. Patient and family understood and then verbalize upcoming appointments, new and existing medications, and other relevant instructions as indicated in the patient's discharge instructions below. Patient and family given a copy of AVS and new prescriptions. All questions raised by family and patient were answered. Patient taken to transportation by tech and taken home by his family. Halsey Persaud, Therapist, sports.   Patient Discharge Instructions  11/22/2013  Admission   [place]       Admission Information      Provider Department Dept Phone    11/22/2013 Eldridge Abrahams, MD [facility] [phone]            Follow-up Information    Follow up with Eldridge Abrahams, MD On 12/16/2013. (Office will call you to arrange your appt (sent))     Specialty: Vascular Surgery    Contact information:    [place] [address]  [phone]           Follow up with Redge Gainer, MD. Schedule an appointment as soon as possible for a visit in 1 week. (f/u on diabetes medication after surgery)     Specialty: Family Medicine    Contact information:    [address] [place] [address]  [phone]           Follow up with Minus Breeding, MD. Schedule an appointment as soon as possible for a visit in 2 weeks.    Specialty: Cardiology    Contact information:    [id number], [place] [address]  [phone]        Appointments for the Next Year    12/16/2013 11:45 AM POST OP [facility] [id number] 15 min        Discharge Instructions    ABDOMINAL PROCEDURE/ANEURYSM REPAIR/AORTO-BIFEMORAL BYPASS:  Call MD for increased abdominal pain; cramping diarrhea; nausea/vomiting   Complete by: As directed        Call MD for:  redness, tenderness, or signs of infection (pain, swelling, bleeding, redness, odor  or green/yellow discharge around incision site)   Complete by: As directed        Call MD for:  severe or increased pain, loss or decreased feeling  in affected limb(s)   Complete by: As directed        Call MD for:  temperature >100.5   Complete by: As directed        Discharge instructions   Complete by: As directed    Diet:  Liquid Administration via: Straw;Cup  Medication Administration: Whole meds with puree  Supervision: Patient able to self feed  Compensations: Slow rate;Small sips/bites;Check for pocketing  Postural Changes and/or Swallow Maneuvers: Seated upright 90 degrees;Upright 30-60 min after meal      Discharge instructions   Complete by: As directed    Resume your Metformin, but if you feel that your food intake is low, don't take your Metformin.  You need to follow up with Dr. Laurance Flatten in the next week to evaluate your diabetic medication.      Discharge wound care:   Complete by: As directed    Shower daily with soap and water starting 12/04/13      Driving Restrictions   Complete by: As directed    No driving for 2 weeks      Lifting  restrictions   Complete by: As directed    No lifting for 4 weeks      Nursing communication   Complete by: As directed    Please discuss with pt that he can resume his metformin, but if poor po intake, don't take metformin.  He needs to f/u with dr. Laurance Flatten in the next week to evaluate his dm medications.      Nursing communication   Complete by: As directed    Please explain to pt and family that he is to take xarelto 15 mg bid for 21 days then start 20 mg daily.                    Medication List     START taking these medications      Next dose due Provider    * Rivaroxaban 15 MG Tabs tablet  Commonly known as: XARELTO  Take 1 tablet (15 mg total) by mouth 2 (two) times daily with a meal.       Samantha J Rhyne      * Rivaroxaban 20 MG Tabs tablet  Doctor's comments: START THIS MEDICATION ON 12/25/13 WHEN YOU ARE  FINISHED WITH XARELTO 15 MG TWICE DAILY.  Commonly known as: XARELTO  Take 1 tablet (20 mg total) by mouth daily.  Start taking on: 12/25/2013       Gabriel Earing                     * Notice: This list has 2 medication(s) that are the same as other medications prescribed for you. Read the directions carefully, and ask your doctor or other care provider to review them with you.     CHANGE how you take these medications      Next dose due Provider    oxyCODONE-acetaminophen 5-325 MG per tablet  Commonly known as: PERCOCET  Take 1 tablet by mouth every 4 (four) hours as needed for severe pain.  What changed: how much to take       Pen Mar taking these medications      Next dose due Provider    aspirin 325 MG tablet  Take 325 mg by mouth daily.           atorvastatin 40 MG tablet  Doctor's comments: NTBS  Commonly known as: LIPITOR  Take 1 tablet (40 mg total) by mouth daily.       Orson Ape [place]      cilostazol 100 MG tablet  Doctor's comments: ntbs  Commonly known as: PLETAL  Take 1 tablet (100 mg total) by mouth 2 (two) times daily.       Orson Ape [place]      diclofenac 75 MG EC tablet  Commonly known as: VOLTAREN  Take 1 tablet (75 mg total) by mouth 2 (two) times daily.       Orson Ape [place]      lisinopril 20 MG tablet  Doctor's comments: ntbs  Commonly known as: PRINIVIL,ZESTRIL  Take 1 tablet (20 mg total) by mouth daily.       Orson Ape [place]      metFORMIN 500 MG tablet  Commonly known as: GLUCOPHAGE  Take by mouth 2 (two) times daily with a meal.           omeprazole 20 MG  capsule  Commonly known as: PRILOSEC  Take 1 capsule (20 mg total) by mouth daily.       Orson Ape [place]      ranitidine 150 MG tablet  Commonly known as: ZANTAC  Take 1 tablet (150 mg total) by mouth 2 (two) times daily.       Orson Ape [place]                      STOP taking these medications    ciprofloxacin  500 MG tablet  Commonly known as: CIPRO         metroNIDAZOLE 500 MG tablet  Commonly known as: FLAGYL                       Where to Get Your Medications    These are the prescriptions that you need to pick up.    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    Acute CVA (cerebrovascular accident)    Diabetes    Hyperkalemia    Hypomagnesemia    Low urine output    Mesenteric ischemia, chronic    Volume overload      Allergies as of 12/04/2013    Allergen Reactions    Morphine And Related Other (See Comments)    "goes crazy"         Lab Results    12/03/13     Glucose: 154     11/24/13     Cholesterol: 85    Triglycerides: 117    HDL Cholesterol: 17     LDL Cholesterol: 45*         Lab Reference Ranges       Reference Ranges (These are the high-low normal lab ranges not actual results)                            Glucose-                     70-99 mg/dL             Cholesterol-                0-200 mg/dL               HDL Cholesterol-        >39 mg/dL             LDL Cholesterol-         0-99 mg/dL                   Triglycerides-              <150 mg/dL         Immunization History as of 12/04/2013 Reviewed on 12/04/2013    Influenza,inj,Quad PF,36+ Mos 12/04/2013      Patient Belongings        Most Recent Value    Patient Belongings      Patient/Family advised about valuables policy?   Yes    Belongings at Bedside   None  Belongings [place] phone, Clothing    Valuables sent home with?   Claiborne Billings Matera-daughter    Belongings Sent to [employer]   None    Home Medications   Not applicable    Patient Belongings Sent Home      Belongings Sent Home   Cell phone, Clothing    Valuables sent home with?   [place]     Patient Belongings Sent to [employer] Sent to Safe   None         Additional Information         IF YOU ARE IN IMMEDIATE DANGER CALL 911!   It is important for you to keep your follow-up appointment with your physician after discharge OR for you / your caregiver to make a follow-up appointment with your physician / medical provider after discharge.    Show these instructions to the next healthcare provider you see.   [place] YOUR HANDS! Hand washing is the number one way to prevent the spread of infection.  You can use soap and water or an alcohol based hand rub that contains at least 60% alcohol.  You should practice hand washing before and after touching your face, surgical incisions, wounds, preparing or eating food and using the restroom.  Do not let anyone else touch your surgical incisions or wounds without performing hand washing.  Be sure that they practice hand washing before putting on gloves and after removing them.   Heart Failure   SPECIAL INSTRUCTIONS AVOID STRAINING STOP ANY ACTIVITY THAT CAUSES CHEST PAIN, SHORTNESS OF BREATH, DIZZINESS, SWEATING, OR EXCESSIVE WEAKNESS.   SPECIAL INSTRUCTIONS FOR PATIENTS WITH HEART FAILURE: Continue to follow the instructions in your Heart Failure Patient Education information that you received during your hospital stay. Record daily weight on same scale at same time of day. If your doctor did not discuss your diet or activity in the information above, please follow a Heart Healthy Low Sodium diet and increase your activity as you feel able. Call your doctor: (Anytime you feel any of the following symptoms) 3-4 pound weight gain in 1-2 days or 2 pounds overnight Shortness of breath, with or without a dry hacking cough Swelling in the hands, feet or stomach If you have to sleep on extra pillows at night in order to breathe     STOP SMOKING!! (Free tobacco cessation counseling.  Call [phone] for an  appointment)    [employer] Number   [place] [id number] or [email or web address]: Abuse/Neglect  Phone Number   [place])  [phone]   [place]  [phone]   [place]  Phone Number   [place]. Vivien Presto  [phone]   Health Clinics  Phone Number   [facility] [place] ([place]) Monday - Friday: 8:00 am - 9:00 pm Saturday and Sunday: 10:00 am - 9:00 pm  [phone]   [place] Monday - Friday: 8:30 am - 5:30 pm Saturday: 9:00 am - 1:00 pm  [phone]    [place]: Abuse/Neglect  Phone Number   [place] [facility] [place]) After hours, holidays and weekends  [phone]   [place]  [phone]   [place] (domestic violence)  [phone]   [place]  [phone]   [place]  Phone Number   [place]  [phone]   Health Clinics  Phone Number   [facility]  [phone]   [place]  [phone]   Community Resources  Phone Number   [facility]  [phone]   Council on Aging  [phone]   Help for the Homeless  [phone]   [facility] [place]  [phone]   [facility]  [phone]            MyChart Information    We are excited to introduce MyChart, a new best-in-class service that provides you online access to important information in your electronic medical record. We want to make it easier for you to view your health information - all in one secure location - when and where you need it. We expect MyChart will enhance the quality of care and service we provide.   Upon your discharge, you will receive  instructions and an activation code to enroll in MyChart online at [email or web address].   When you register for MyChart, you can:   View your test results. View your medical history, allergies, medications, and immunizations. Conveniently print information such as your medication lists.   If you are age 9 or older and want a member of your family to have access to your record, you must provide written consent by completing a proxy form available at our site.   Please read the following guidelines regarding accounts for our younger patients:   Once teenagers turn 44, they can request a MyChart account and access to their electronic medical records.    Parents and guardians can request electronic access through proxy to their child's medical record until the child's 12th birthday. Because certain diagnoses and treatment information is protected for adolescents (ages 47-17), we do not offer electronic access through MyChart to their medical records.   Parents and guardians can request copies of available medical information for adolescents through the appropriate physician office or Health Information Management Department until the child reaches age 30.   If you have non-urgent health-related questions, please discuss those with your physician at your follow-up appointment   As you activate your MyChart account and need any technical assistance, please call the MyChart technical support line at [phone]) or email your technical question to [email or web address]. If you email your question(s), please include your name, a return phone number and the best time to reach you.   Thank you for using MyChart as your new health and wellness resource!      Instructions on MyChart Enrollment    Thank you for enrolling in [place]. Please follow the instructions below to securely access your online medical record. MyChart allows you to send messages to your doctor, view  your test results, manage appointments, and more.    How Do I Sign Up? In your Internet browser, go to [employer] and enter [email or web address]. Click on the Sign Up Now link in the Sign In box. You will see the New Member Sign Up page. Enter your MyChart Access Code exactly as it appears below. You will not need to use this code after you've completed the sign-up process. If you do not sign up before the expiration date, you must request a new code.   MyChart Access Code: [email or web address] Expires: 02/02/2014 11:31 AM    Enter your  Social Security Number ([id number]) and Date of Birth (mm/dd/yyyy) as indicated and click Submit. You will be taken to the next sign-up page. Create a MyChart ID. This will be your MyChart login ID and cannot be changed, so think of one that is secure and easy to remember. Create a Clinical biochemist. You can change your password at any time. Enter your Password Reset Question and Answer. This can be used at a later time if you forget your password.   Enter your e-mail address. You will receive e-mail notification when new information is available in MyChart. Click Sign Up. You can now view your medical record.    Additional Information Remember, MyChart is NOT to be used for urgent needs. For medical emergencies, dial 911.                           Patient/Caregiver:________________________________              Date:____________                    Clinician Signature:  __________________________________________         Date:______________________               Your Daily Medication List       aspirin 325 MG tablet  Take 325 mg by mouth daily.         atorvastatin 40 MG tablet  Commonly known as: LIPITOR  Take 1 tablet (40 mg total) by mouth daily.         cilostazol 100 MG tablet  Commonly known as: PLETAL  Take 1 tablet (100 mg total) by mouth 2 (two) times daily.         diclofenac 75 MG EC tablet  Commonly  known as: VOLTAREN  Take 1 tablet (75 mg total) by mouth 2 (two) times daily.         lisinopril 20 MG tablet  Commonly known as: PRINIVIL,ZESTRIL  Take 1 tablet (20 mg total) by mouth daily.         metFORMIN 500 MG tablet  Commonly known as: GLUCOPHAGE  Take by mouth 2 (two) times daily with a meal.         omeprazole 20 MG capsule  Commonly known as: PRILOSEC  Take 1 capsule (20 mg total) by mouth daily.         oxyCODONE-acetaminophen 5-325 MG per tablet  Commonly known as: PERCOCET  Take 1 tablet by mouth every 4 (four) hours as needed for severe pain.         ranitidine 150 MG tablet  Commonly known as: ZANTAC  Take 1 tablet (150 mg total) by mouth 2 (two) times daily.         * Rivaroxaban 15 MG Tabs tablet  Commonly known as: XARELTO  Take 1 tablet (15 mg total) by mouth 2 (two) times daily with a meal.         * Rivaroxaban 20 MG Tabs tablet  Commonly known as: XARELTO  Take 1 tablet (20 mg total) by mouth daily.  Start taking on: 12/25/2013                      * Notice: This list has 2 medication(s) that are the same as other medications prescribed for you. Read the directions carefully, and ask your doctor or other care provider to review them with you.     Where  to Get Your Medications    These are the prescriptions that you need to pick up.    You may get the following medications from any pharmacy  -  oxyCODONE-acetaminophen 5-325 MG per tablet  -  Rivaroxaban 15 MG Tabs tablet  -  Rivaroxaban 20 MG Tabs tablet                       Patient Education    Information on my medicine - XARELTO (rivaroxaban)   This medication education was reviewed with me or my healthcare representative as part of my discharge preparation.  The pharmacist that spoke with me during my hospital stay was:  Bajbus, Lauren, RPH   WHY WAS XARELTO PRESCRIBED FOR YOU? Xarelto was prescribed to treat blood clots that may have been found in the veins of your  legs (deep vein thrombosis) or in your lungs (pulmonary embolism) and to reduce the risk of them occurring again.   WHAT DO YOU NEED TO KNOW ABOUT XARELTO? The starting dose is one 15 mg tablet taken TWICE daily with food for the FIRST 21 DAYS then on (enter date)  2/4  the dose is changed to one 20 mg tablet taken ONCE A DAY with your evening meal.   DO NOT stop taking Xarelto without talking to the health care provider who prescribed the medication.  Refill your prescription for 20 mg tablets before you run out.   After discharge, you should have regular check-up appointments with your healthcare provider that is prescribing your Xarelto.  In the future your dose may need to be changed if your kidney function changes by a significant amount.   WHAT DO YOU DO IF YOU MISS A DOSE? If you are taking Xarelto TWICE DAILY and you miss a dose, take it as soon as you remember. You may take two 15 mg tablets (total 30 mg) at the same time then resume your regularly scheduled 15 mg twice daily the next day.   If you are taking Xarelto ONCE DAILY and you miss a dose, take it as soon as you remember on the same day then continue your regularly scheduled once daily regimen the next day. Do not take two doses of Xarelto at the same time.    IMPORTANT SAFETY INFORMATION Xarelto is a blood thinner medicine that can cause bleeding. You should call your healthcare provider right away if you experience any of the following: Bleeding from an injury or your nose that does not stop. Unusual colored urine (red or dark brown) or unusual colored stools (red or black). Unusual bruising for unknown reasons. A serious fall or if you hit your head (even if there is no bleeding).   Some medicines may interact with Xarelto and might increase your risk of bleeding while on Xarelto. To help avoid this, consult your healthcare provider or pharmacist prior to using any new prescription or non-prescription  medications, including herbals, vitamins, non-steroidal anti-inflammatory drugs (NSAIDs) and supplements.   This website has more information on Xarelto: [email or web address]

## 2013-12-04 NOTE — Progress Notes (Signed)
Occupational Therapy Treatment Patient Details Name: Perry Kennedy MRN: [id number] DOB: 1941-09-13 Today's Date: 12/04/2013 Time: 6759-1638 OT Time Calculation (min): 8 min  OT Assessment / Plan / Recommendation  History of present illness s/p extensive aorta-bifemoral bypass graft. Post-op (and post-PT eval) Rt frontal CVA   OT comments  Pt with limited participation this pm - pt cites fatigue and he doesn't want to do anything but go home.  Discussed tub DME options and pt reports he will figure something out.    Follow Up Recommendations  Home health OT;Supervision - Intermittent    Barriers to Discharge       Equipment Recommendations  Tub/shower seat    Recommendations for Other Services    Frequency Min 2X/week   Progress towards OT Goals Progress towards OT goals: Progressing toward goals  Plan Discharge plan remains appropriate    Precautions / Restrictions Precautions Precautions: Fall   Pertinent Vitals/Pain     ADL  Lower Body Bathing: Supervision/safety Where Assessed - Lower Body Bathing: Unsupported sit to stand Lower Body Dressing: Supervision/safety Where Assessed - Lower Body Dressing: Unsupported sit to stand Toilet Transfer: Supervision/safety Toilet Transfer Method: Stand pivot Toilet Transfer Equipment: Regular height toilet Equipment Used: Rolling walker Transfers/Ambulation Related to ADLs: supervision ADL Comments: Pt only agreeable to minimal activity.  He is eager to discharge home.  Discussed recommendation for seat in shower.  He reports he doesn't have a seat in the shower currently, but reports he will figure something out, and doesn't feel he needs to purchase a seat or practice.      OT Diagnosis:    OT Problem List:   OT Treatment Interventions:     OT Goals(current goals can now be found in the care plan section) Acute Rehab OT Goals OT Goal Formulation: With patient Time For Goal Achievement: 12/09/13 ADL Goals Pt Will Perform  Upper Body Dressing: with modified independence;sitting Pt Will Perform Lower Body Dressing: with modified independence;sit to/from stand Pt Will Transfer to Toilet: with modified independence;ambulating;regular height toilet Pt Will Perform Toileting - Clothing Manipulation and hygiene: with modified independence;sit to/from stand Pt Will Perform Tub/Shower Transfer: Tub transfer;with supervision;ambulating Additional ADL Goal #1: Pt will perform bed mobility at mod I level as precursor for EOB ADLs.  Visit Information  Last OT Received On: 12/04/13 Assistance Needed: +1 History of Present Illness: s/p extensive aorta-bifemoral bypass graft. Post-op (and post-PT eval) Rt frontal CVA    Subjective Data      Prior Functioning       Cognition  Cognition Arousal/Alertness: Awake/alert Behavior During Therapy: WFL for tasks assessed/performed Overall Cognitive Status: Within Functional Limits for tasks assessed Area of Impairment: Attention;Safety/judgement;Awareness;Problem solving Current Attention Level: Selective Awareness: Intellectual General Comments: Pt self distracts     Mobility  Transfers Overall transfer level: Needs assistance Equipment used: Rolling walker (2 wheeled) Transfers: Sit to/from Stand Sit to Stand: Supervision General transfer comment: x3; repeated for education re: safe use of RW; pt required cues only and demonstrated safe technique x 2    Exercises      Balance General Comments General comments (skin integrity, edema, etc.): Noted plans to d/c home today. Pt spoke with son by phone to determine if he already has a RW at home. (Case Manager to follow-up with family). Pt unsure if he will stay with his son or his son will stay with him on d/c. Agrees to HHPT  End of Session OT - End of Session Equipment Utilized During  Treatment: Rolling walker Activity Tolerance: Patient limited by fatigue Patient left: in chair;with call bell/phone within reach   [place], Perry Kennedy 12/04/2013, 2:55 PM

## 2013-12-04 NOTE — Discharge Instructions (Signed)
Information on my medicine - XARELTO (rivaroxaban)  This medication education was reviewed with me or my healthcare representative as part of my discharge preparation.  The pharmacist that spoke with me during my hospital stay was:  Aileene Lanum, RPH  WHY WAS XARELTO PRESCRIBED FOR YOU? Xarelto was prescribed to treat blood clots that may have been found in the veins of your legs (deep vein thrombosis) or in your lungs (pulmonary embolism) and to reduce the risk of them occurring again.  What do you need to know about Xarelto? The starting dose is one 15 mg tablet taken TWICE daily with food for the FIRST 21 DAYS then on (enter date)  2/4  the dose is changed to one 20 mg tablet taken ONCE A DAY with your evening meal.  DO NOT stop taking Xarelto without talking to the health care provider who prescribed the medication.  Refill your prescription for 20 mg tablets before you run out.  After discharge, you should have regular check-up appointments with your healthcare provider that is prescribing your Xarelto.  In the future your dose may need to be changed if your kidney function changes by a significant amount.  What do you do if you miss a dose? If you are taking Xarelto TWICE DAILY and you miss a dose, take it as soon as you remember. You may take two 15 mg tablets (total 30 mg) at the same time then resume your regularly scheduled 15 mg twice daily the next day.  If you are taking Xarelto ONCE DAILY and you miss a dose, take it as soon as you remember on the same day then continue your regularly scheduled once daily regimen the next day. Do not take two doses of Xarelto at the same time.   Important Safety Information Xarelto is a blood thinner medicine that can cause bleeding. You should call your healthcare provider right away if you experience any of the following:   Bleeding from an injury or your nose that does not stop.   Unusual colored urine (red or dark brown) or unusual  colored stools (red or black).   Unusual bruising for unknown reasons.   A serious fall or if you hit your head (even if there is no bleeding).  Some medicines may interact with Xarelto and might increase your risk of bleeding while on Xarelto. To help avoid this, consult your healthcare provider or pharmacist prior to using any new prescription or non-prescription medications, including herbals, vitamins, non-steroidal anti-inflammatory drugs (NSAIDs) and supplements.  This website has more information on Xarelto: [email or web address]-benson.[email or web address]

## 2013-12-06 ENCOUNTER — Telehealth: Payer: Self-pay | Admitting: *Deleted

## 2013-12-06 ENCOUNTER — Other Ambulatory Visit: Payer: Self-pay | Admitting: Family Medicine

## 2013-12-06 MED ORDER — CITALOPRAM HYDROBROMIDE 20 MG PO TABS: 20.0000 mg | ORAL_TABLET | Freq: Every day | ORAL | Status: DC

## 2013-12-06 NOTE — Telephone Encounter (Signed)
Pt just returned home from having a major, major surgery. 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 Advanced nurse feels pt needs something for depression called into [facility]. Also, call the son at [phone], when it is done. Make sure Zigmund Daniel knows to call the family after you order something

## 2013-12-06 NOTE — Telephone Encounter (Signed)
Sent in celexa 20mg  po qd for depression

## 2013-12-07 ENCOUNTER — Other Ambulatory Visit: Payer: Self-pay | Admitting: Family Medicine

## 2013-12-07 ENCOUNTER — Telehealth: Payer: Self-pay | Admitting: Vascular Surgery

## 2013-12-07 NOTE — Telephone Encounter (Signed)
Spoke with patient's daughter regarding constipation. No BM x 3 days despite taking stool softener.  No nausea or vomiting.  Recommended Fleets enema x 2.  Ruta Hinds, MD [facility] Office: [phone] Pager: [phone]

## 2013-12-09 ENCOUNTER — Telehealth: Payer: Self-pay

## 2013-12-09 NOTE — Telephone Encounter (Signed)
Rec'd call from pt's Physical Therapist from [place].  Stated pt. fell on 12/04/13, after he got discharged from the hospital.  Stated that pt. has a "50 cent piece sized pouch that is raised and hard on the proximal (L) groin incision."   Stated that the pt. C/o pain at level "8-10" in the raised area of left groin.  Stated the incision is intact.  States no fever/ chills; reports temp. 97.9, BP 172/100.  Home care nurse called and reported pt. Had no motivation; was not walking or moving about.  Also stated that his appetite was poor.  Also, reported pt. Had constipation when she assessed him on 1/16.  She notified his PCP, due to concern of depression.  Discussed with Dr. Trula Slade.  Advised to continue to monitor site.  If increases in size, will bring pt. in for office exam sooner.  Per Dr. Trula Slade, pt. exhibited lack of motivation and willingness to walk when in the hospital.  Phone call to Nicole Kindred, pt's son.  Stated pt's constipation had resolved; reported a good BM on Friday, and another BM this morning.  Encouraged to have pt. drink adequate fluids, and to maintain mobility, and to slowly increase mobility as tolerated.  Son Production manager. Understanding. Son advised to call sooner if area left groin worsens.  Also notified Malachy Mood, PT, of plan to continue to monitor pt's left groin and report if increases in size/ worsens.  Pt. Has f/u appt. On 12/16/13 @ 11:45 AM.

## 2013-12-10 NOTE — Discharge Summary (Signed)
I agree with the above  Wells Brieanne Mignone 

## 2013-12-12 ENCOUNTER — Encounter (HOSPITAL_COMMUNITY): Payer: Self-pay | Admitting: Emergency Medicine

## 2013-12-12 ENCOUNTER — Ambulatory Visit (INDEPENDENT_AMBULATORY_CARE_PROVIDER_SITE_OTHER): Payer: Medicare Other | Admitting: Family Medicine

## 2013-12-12 ENCOUNTER — Emergency Department (HOSPITAL_COMMUNITY)
Admission: EM | Admit: 2013-12-12 | Discharge: 2013-12-12 | Disposition: A | Discharge status: Home / Self Care | Payer: Medicare Other | Attending: Emergency Medicine | Admitting: Emergency Medicine

## 2013-12-12 ENCOUNTER — Encounter: Payer: Self-pay | Admitting: Family Medicine

## 2013-12-12 VITALS — BP 158/90 | HR 104 | Temp 98.5°F | Ht 66.0 in | Wt 131.2 lb

## 2013-12-12 DIAGNOSIS — R1032 Left lower quadrant pain: Secondary | ICD-10-CM | POA: Insufficient documentation

## 2013-12-12 DIAGNOSIS — Z7901 Long term (current) use of anticoagulants: Secondary | ICD-10-CM | POA: Insufficient documentation

## 2013-12-12 DIAGNOSIS — J449 Chronic obstructive pulmonary disease, unspecified: Secondary | ICD-10-CM | POA: Insufficient documentation

## 2013-12-12 DIAGNOSIS — M109 Gout, unspecified: Secondary | ICD-10-CM | POA: Insufficient documentation

## 2013-12-12 DIAGNOSIS — J4489 Other specified chronic obstructive pulmonary disease: Secondary | ICD-10-CM | POA: Insufficient documentation

## 2013-12-12 DIAGNOSIS — I739 Peripheral vascular disease, unspecified: Secondary | ICD-10-CM | POA: Insufficient documentation

## 2013-12-12 DIAGNOSIS — G8918 Other acute postprocedural pain: Secondary | ICD-10-CM | POA: Insufficient documentation

## 2013-12-12 DIAGNOSIS — K219 Gastro-esophageal reflux disease without esophagitis: Secondary | ICD-10-CM | POA: Insufficient documentation

## 2013-12-12 DIAGNOSIS — E119 Type 2 diabetes mellitus without complications: Secondary | ICD-10-CM | POA: Insufficient documentation

## 2013-12-12 DIAGNOSIS — Z79899 Other long term (current) drug therapy: Secondary | ICD-10-CM | POA: Insufficient documentation

## 2013-12-12 DIAGNOSIS — Z9889 Other specified postprocedural states: Secondary | ICD-10-CM | POA: Insufficient documentation

## 2013-12-12 DIAGNOSIS — Z7982 Long term (current) use of aspirin: Secondary | ICD-10-CM | POA: Insufficient documentation

## 2013-12-12 DIAGNOSIS — F17200 Nicotine dependence, unspecified, uncomplicated: Secondary | ICD-10-CM | POA: Insufficient documentation

## 2013-12-12 DIAGNOSIS — Z9049 Acquired absence of other specified parts of digestive tract: Secondary | ICD-10-CM | POA: Insufficient documentation

## 2013-12-12 DIAGNOSIS — L7682 Other postprocedural complications of skin and subcutaneous tissue: Secondary | ICD-10-CM

## 2013-12-12 DIAGNOSIS — G4700 Insomnia, unspecified: Secondary | ICD-10-CM | POA: Insufficient documentation

## 2013-12-12 DIAGNOSIS — I2510 Atherosclerotic heart disease of native coronary artery without angina pectoris: Secondary | ICD-10-CM | POA: Insufficient documentation

## 2013-12-12 DIAGNOSIS — Z86718 Personal history of other venous thrombosis and embolism: Secondary | ICD-10-CM | POA: Insufficient documentation

## 2013-12-12 DIAGNOSIS — I10 Essential (primary) hypertension: Secondary | ICD-10-CM | POA: Insufficient documentation

## 2013-12-12 DIAGNOSIS — E785 Hyperlipidemia, unspecified: Secondary | ICD-10-CM | POA: Insufficient documentation

## 2013-12-12 LAB — CBC WITH DIFFERENTIAL/PLATELET
Basophils Absolute: 0 10*3/uL (ref 0.0–0.1)
Basophils Relative: 0 % (ref 0–1)
Eosinophils Absolute: 0.1 10*3/uL (ref 0.0–0.7)
Eosinophils Relative: 1 % (ref 0–5)
HEMATOCRIT: 36.6 % — AB (ref 39.0–52.0)
HEMOGLOBIN: 12.7 g/dL — AB (ref 13.0–17.0)
Lymphocytes Relative: 10 % — ABNORMAL LOW (ref 12–46)
Lymphs Abs: 1 10*3/uL (ref 0.7–4.0)
MCH: 29.5 pg (ref 26.0–34.0)
MCHC: 34.7 g/dL (ref 30.0–36.0)
MCV: 84.9 fL (ref 78.0–100.0)
MONO ABS: 0.7 10*3/uL (ref 0.1–1.0)
Monocytes Relative: 8 % (ref 3–12)
Neutro Abs: 7.6 10*3/uL (ref 1.7–7.7)
Neutrophils Relative %: 80 % — ABNORMAL HIGH (ref 43–77)
Platelets: 404 10*3/uL — ABNORMAL HIGH (ref 150–400)
RBC: 4.31 MIL/uL (ref 4.22–5.81)
RDW: 14.6 % (ref 11.5–15.5)
WBC: 9.5 10*3/uL (ref 4.0–10.5)

## 2013-12-12 LAB — COMPREHENSIVE METABOLIC PANEL
ALK PHOS: 57 U/L (ref 39–117)
ALT: 13 U/L (ref 0–53)
AST: 19 U/L (ref 0–37)
Albumin: 3.5 g/dL (ref 3.5–5.2)
BUN: 13 mg/dL (ref 6–23)
CO2: 28 meq/L (ref 19–32)
CREATININE: 0.77 mg/dL (ref 0.50–1.35)
Calcium: 9.3 mg/dL (ref 8.4–10.5)
Chloride: 89 mEq/L — ABNORMAL LOW (ref 96–112)
GFR calc Af Amer: >90 mL/min (ref >90)
GFR, EST NON AFRICAN AMERICAN: 89 mL/min — AB (ref >90)
Glucose, Bld: 138 mg/dL — ABNORMAL HIGH (ref 70–99)
Potassium: 3.8 mEq/L (ref 3.7–5.3)
Sodium: 132 mEq/L — ABNORMAL LOW (ref 137–147)
Total Bilirubin: 0.6 mg/dL (ref 0.3–1.2)
Total Protein: 7.1 g/dL (ref 6.0–8.3)

## 2013-12-12 LAB — POCT CBC
Granulocyte percent: 80.3 %G — AB (ref 37–80)
HCT, POC: 38.8 % — AB (ref 43.5–53.7)
Hemoglobin: 13 g/dL — AB (ref 14.1–18.1)
Lymph, poc: 1 (ref 0.6–3.4)
MCH, POC: 29.3 pg (ref 27–31.2)
MCHC: 33.5 g/dL (ref 31.8–35.4)
MCV: 87.3 fL (ref 80–97)
MPV: 5.8 fL (ref 0–99.8)
POC Granulocyte: 6.7 (ref 2–6.9)
POC LYMPH PERCENT: 12.4 %L (ref 10–50)
Platelet Count, POC: 451 10*3/uL — AB (ref 142–424)
RBC: 4.4 M/uL — AB (ref 4.69–6.13)
RDW, POC: 14.5 %
WBC: 8.3 10*3/uL (ref 4.6–10.2)

## 2013-12-12 LAB — LIPASE, BLOOD: Lipase: 62 U/L — ABNORMAL HIGH (ref 11–59)

## 2013-12-12 MED ORDER — OXYCODONE HCL 5 MG PO TABS: 5.0000 mg | ORAL_TABLET | ORAL | Status: DC | PRN

## 2013-12-12 MED ORDER — OXYCODONE-ACETAMINOPHEN 5-325 MG PO TABS
1.0000 | ORAL_TABLET | Freq: Once | ORAL | Status: AC
  Administered 2013-12-12: 1 via ORAL
  Filled 2013-12-12: qty 1

## 2013-12-12 NOTE — Progress Notes (Signed)
   Subjective:    Patient ID: Perry Kennedy, male    DOB: 1940-11-25, 73 y.o.   MRN: [id number]  HPI Patient is here accompanied by his son.  He is having severe abdominal pain.  He States the oxycodone is not helping for pain.  He states he cannot sleep.  He states he [place] recently.  There is a note from his home health nurse concerning his abdominal pain And recent fall.  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  He states his pain has never improved.  He recently Has been rx'd depression medicine celexa.  He is not eating well according to his son.  Review of Systems No chest pain, SOB, HA, dizziness, vision change, N/V, diarrhea, constipation, dysuria, urinary urgency or frequency, myalgias, arthralgias or rash.     Objective:   Physical Exam Vital signs noted  Well developed well nourished male.  HEENT - Head atraumatic Normocephalic                Eyes - PERRLA, Conjuctiva - clear Sclera- Clear EOMI                Ears - EAC's Wnl TM's Wnl Gross Hearing WNL                Nose - Nares patent                 Throat - oropharanx wnl Respiratory - Lungs CTA bilateral Cardiac - RRR S1 and S2 without murmur GI - Abdomen soft Nontender and bowel sounds active x 4        Mid Abdominal Incision with intact sutures and no dehiscing.  LLQ abdomen with incision that        Is healing well. Extremities - No edema. Neuro - Grossly intact.       Assessment & Plan:  Abdominal pain, left lower quadrant Called his surgeon and his nurse has instructed me to tell patient to go to [facility] And this is relayed to the patient and his son and patient is taken to ER.  Lysbeth Penner FNP

## 2013-12-12 NOTE — ED Provider Notes (Addendum)
CSN: [id number]     Arrival date & time 12/12/13  1836 History   First MD Initiated Contact with Patient 12/12/13 2223     Chief Complaint  Patient presents with  . Abdominal Pain  . Fatigue  . Fall   (Consider location/radiation/quality/duration/timing/severity/associated sxs/prior Treatment) Patient is a 73 y.o. male presenting with abdominal pain. The history is provided by the patient.  Abdominal Pain Pain location:  LLQ Pain quality: sharp   Pain radiates to:  Does not radiate Pain severity:  Moderate Onset quality:  Gradual Duration:  8 weeks Timing:  Constant Progression:  Unchanged Chronicity:  New Context: previous surgery   Context: not awakening from sleep, not eating, not medication withdrawal and not sick contacts   Relieved by:  Nothing Worsened by:  Nothing tried Ineffective treatments:  None tried Associated symptoms: no anorexia, no chest pain, no chills, no diarrhea, no fever, no nausea, no shortness of breath and no vomiting   Risk factors: multiple surgeries and recent hospitalization     Past Medical History  Diagnosis Date  . Hyperlipidemia   . GERD (gastroesophageal reflux disease)   . Hypertension   . COPD (chronic obstructive pulmonary disease)   . Insomnia   . Gout   . DVT (deep venous thrombosis)   . Peripheral vascular disease   . Diabetes mellitus without complication   . Headache(784.0)     migranes- not in a few years  . Coronary artery disease    Past Surgical History  Procedure Laterality Date  . Hemicolectomy    . Aorta - bilateral femoral artery bypass graft N/A 11/22/2013    Procedure: AORTA BIFEMORAL BYPASS GRAFT; SUPERIOR MESENTERIC ARTERY BYPASS, INFERIOR MESENTERIC BYPASS;  Surgeon: Serafina Mitchell, MD;  Location: [facility] OR;  Service: Vascular;  Laterality: N/A;   Family History  Problem Relation Age of Onset  . Heart disease Father 64    pacemaker  . Diabetes Mother     renal failure  . Heart disease Mother   . CAD Brother      died at 3  . COPD Brother   . Heart disease Brother   . Hyperlipidemia Daughter   . Diabetes Son   . Heart disease Son     before age 73  . Heart attack Son    History  Substance Use Topics  . Smoking status: Current Every Day Smoker -- 1.50 packs/day for 55 years    Types: Cigarettes  . Smokeless tobacco: Not on file  . Alcohol Use: No    Review of Systems  Constitutional: Negative for fever and chills.  HENT: Negative for congestion and facial swelling.   Eyes: Negative for discharge and visual disturbance.  Respiratory: Negative for shortness of breath.   Cardiovascular: Negative for chest pain and palpitations.  Gastrointestinal: Positive for abdominal pain. Negative for nausea, vomiting, diarrhea and anorexia.  Musculoskeletal: Negative for arthralgias and myalgias.  Skin: Negative for color change and rash.       Large midline incision well-healed no noted discharge. Bilateral inguinal incisions noted discharge or erythema.   Neurological: Negative for tremors, syncope and headaches.  Psychiatric/Behavioral: Negative for confusion and dysphoric mood.    Allergies  Morphine and related  Home Medications   Current Outpatient Rx  Name  Route  Sig  Dispense  Refill  . aspirin 325 MG tablet   Oral   Take 325 mg by mouth daily.         Marland Kitchen atorvastatin (LIPITOR) 40  MG tablet   Oral   Take 1 tablet (40 mg total) by mouth daily.   30 tablet   2     NTBS   . cilostazol (PLETAL) 100 MG tablet   Oral   Take 1 tablet (100 mg total) by mouth 2 (two) times daily.   60 tablet   2     ntbs   . citalopram (CELEXA) 20 MG tablet   Oral   Take 1 tablet (20 mg total) by mouth daily.   30 tablet   11   . diclofenac (VOLTAREN) 75 MG EC tablet      TAKE ONE TABLET BY MOUTH TWICE DAILY   60 tablet   0   . lisinopril (PRINIVIL,ZESTRIL) 20 MG tablet   Oral   Take 1 tablet (20 mg total) by mouth daily.   30 tablet   2     ntbs   . metFORMIN (GLUCOPHAGE)  500 MG tablet   Oral   Take by mouth 2 (two) times daily with a meal.         . omeprazole (PRILOSEC) 20 MG capsule   Oral   Take 1 capsule (20 mg total) by mouth daily.   30 capsule   5   . oxyCODONE-acetaminophen (PERCOCET) 5-325 MG per tablet   Oral   Take 1 tablet by mouth every 4 (four) hours as needed for severe pain.   30 tablet   0   . ranitidine (ZANTAC) 150 MG tablet   Oral   Take 1 tablet (150 mg total) by mouth 2 (two) times daily.   60 tablet   5   . Rivaroxaban (XARELTO) 15 MG TABS tablet   Oral   Take 1 tablet (15 mg total) by mouth 2 (two) times daily with a meal.   41 tablet   0   . oxyCODONE (ROXICODONE) 5 MG immediate release tablet   Oral   Take 1 tablet (5 mg total) by mouth every 4 (four) hours as needed for severe pain or breakthrough pain.   5 tablet   0   . Rivaroxaban (XARELTO) 20 MG TABS tablet   Oral   Take 1 tablet (20 mg total) by mouth daily.   30 tablet   3     START THIS MEDICATION ON 12/25/13 WHEN YOU ARE FINIS .Marland KitchenMarland Kitchen    BP 170/85  Pulse 88  Temp(Src) 98.2 F (36.8 C) (Oral)  Resp 18  SpO2 95% Physical Exam  Constitutional: He is oriented to person, place, and time. He appears well-developed and well-nourished.  HENT:  Head: Normocephalic and atraumatic.  Eyes: EOM are normal. Pupils are equal, round, and reactive to light.  Neck: Normal range of motion. Neck supple. No JVD present.  Cardiovascular: Normal rate and regular rhythm.  Exam reveals no gallop and no friction rub.   No murmur heard. Pulmonary/Chest: No respiratory distress. He has no wheezes.  Abdominal: He exhibits no distension. There is tenderness (Mild LLQ). There is no rebound and no guarding.  Well healing incision to LLQ, no noted fluctuance  Musculoskeletal: Normal range of motion.  Neurological: He is alert and oriented to person, place, and time.  Skin: No rash noted. No pallor.  Psychiatric: He has a normal mood and affect. His behavior is normal.     ED Course  Procedures (including critical care time) Labs Review Labs Reviewed  CBC WITH DIFFERENTIAL - Abnormal; Notable for the following:    Hemoglobin 12.7 (*)  HCT 36.6 (*)    Platelets 404 (*)    Neutrophils Relative % 80 (*)    Lymphocytes Relative 10 (*)    All other components within normal limits  COMPREHENSIVE METABOLIC PANEL - Abnormal; Notable for the following:    Sodium 132 (*)    Chloride 89 (*)    Glucose, Bld 138 (*)    GFR calc non Af Amer 89 (*)    All other components within normal limits  LIPASE, BLOOD - Abnormal; Notable for the following:    Lipase 62 (*)    All other components within normal limits   Imaging Review No results found.  EKG Interpretation   None       MDM   1. Incisional pain     Patient is a 73 year old male with the chief complaint of left lower quadrant tenderness. Patient states this going on for quite some time. Patient has had multiple workups for this. As you with recent iliofemoral bypass done by vascular surgery. Was seen by his PCP today told him that he was having continuous left lower quadrant pain. PCP concerned because patient stated that he had fallen twice that he may have damaged his surgical repair and sent him to the risk department.  Patient with minimal pain on exam no noted areas of fluctuance. Patient denies any fevers chills.  Patient appears well is nontoxic. Patient has good femoral pulses bilaterally. Patient with warm bilateral lower extremities.  See no reason for further workup as patient had this pain prior to the surgery. He'll followup with his vascular surgeon on Tuesday as scheduled.   12:08 AM:  I have discussed the diagnosis/risks/treatment options with the patient and family and believe the pt to be eligible for discharge home to follow-up with Vascular. We also discussed returning to the ED immediately if new or worsening sx occur. We discussed the sx which are most concerning (e.g., sudden  increase in pain, cold foot) that necessitate immediate return. Any new prescriptions provided to the patient are listed below.  Discharge Medication List as of 12/12/2013 10:46 PM    START taking these medications   Details  oxyCODONE (ROXICODONE) 5 MG immediate release tablet Take 1 tablet (5 mg total) by mouth every 4 (four) hours as needed for severe pain or breakthrough pain., Starting 12/12/2013, Until Discontinued, Print           Deno Etienne, MD 12/13/13 [id number]  Deno Etienne, MD 01/07/14 2113

## 2013-12-12 NOTE — Patient Instructions (Signed)
Abdominal Pain, Adult °Many things can cause abdominal pain. Usually, abdominal pain is not caused by a disease and will improve without treatment. It can often be observed and treated at home. Your health care provider will do a physical exam and possibly order blood tests and X-rays to help determine the seriousness of your pain. However, in many cases, more time must pass before a clear cause of the pain can be found. Before that point, your health care provider may not know if you need more testing or further treatment. °HOME CARE INSTRUCTIONS  °Monitor your abdominal pain for any changes. The following actions may help to alleviate any discomfort you are experiencing: °· Only take over-the-counter or prescription medicines as directed by your health care provider. °· Do not take laxatives unless directed to do so by your health care provider. °· Try a clear liquid diet (broth, tea, or water) as directed by your health care provider. Slowly move to a bland diet as tolerated. °SEEK MEDICAL CARE IF: °· You have unexplained abdominal pain. °· You have abdominal pain associated with nausea or diarrhea. °· You have pain when you urinate or have a bowel movement. °· You experience abdominal pain that wakes you in the night. °· You have abdominal pain that is worsened or improved by eating food. °· You have abdominal pain that is worsened with eating fatty foods. °SEEK IMMEDIATE MEDICAL CARE IF:  °· Your pain does not go away within 2 hours. °· You have a fever. °· You keep throwing up (vomiting). °· Your pain is felt only in portions of the abdomen, such as the right side or the left lower portion of the abdomen. °· You pass bloody or black tarry stools. °MAKE SURE YOU: °· Understand these instructions.   °· Will watch your condition.   °· Will get help right away if you are not doing well or get worse.   °Document Released: 08/17/2005 Document Revised: 08/28/2013 Document Reviewed: 07/17/2013 °Exit[employer]® Patient  Information ©2014 [employer]. ° °

## 2013-12-12 NOTE — ED Notes (Signed)
Nurse first rounding, warm blanket given.

## 2013-12-12 NOTE — ED Notes (Signed)
Patient presents with c/o pain to the left groin incision where he had surgery on January 2nd.  Incision intact and no drainage noted.  Right groin incision and mid chest incision intact with no drainage. Upon palpation patient denies pain.  No redness noted to any incision.

## 2013-12-12 NOTE — ED Notes (Signed)
Left ankle has 2+ pitting edema.  Moves toes and feet real well

## 2013-12-12 NOTE — Progress Notes (Signed)
I agree with the above  Perry Kennedy 

## 2013-12-12 NOTE — ED Notes (Signed)
Pt sent here from pcp office. Reports recent abd surgery on 1/2 and still has LLQ pain near incision, no relief with pain meds. Pt having generalized fatigue, not eating well, recent falls x 2. Wound is closed, no drainage noted at triage.

## 2013-12-13 ENCOUNTER — Telehealth: Payer: Self-pay | Admitting: Family Medicine

## 2013-12-13 ENCOUNTER — Encounter: Payer: Self-pay | Admitting: Surgery

## 2013-12-13 ENCOUNTER — Other Ambulatory Visit: Payer: Self-pay | Admitting: Family Medicine

## 2013-12-13 MED ORDER — DEXTROSE 50 % IV SOLN
1.0000 | Freq: Once | INTRAVENOUS | Status: DC
Start: 2013-12-13 — End: 2013-12-13

## 2013-12-13 MED ORDER — OXYCODONE HCL 5 MG PO TABS: 5.0000 mg | ORAL_TABLET | ORAL | Status: DC | PRN

## 2013-12-13 NOTE — ED Provider Notes (Signed)
I saw and evaluated the patient, reviewed the resident's note and I agree with the findings and plan.  EKG Interpretation   None       Pt with abdominal pain and ?fall several days ago. Recent vascular surgery but pain ongoing since prior to that. Benign abdomen. Strong pulses in inguinal grafts.   Leman Martinek B. Karle Starch, MD 12/13/13 [id number]

## 2013-12-16 ENCOUNTER — Encounter: Payer: Self-pay | Admitting: Surgery

## 2013-12-16 ENCOUNTER — Ambulatory Visit (INDEPENDENT_AMBULATORY_CARE_PROVIDER_SITE_OTHER): Payer: Self-pay | Admitting: Surgery

## 2013-12-16 VITALS — BP 158/99 | HR 112 | Temp 98.2°F | Resp 18 | Ht 66.0 in | Wt 124.0 lb

## 2013-12-16 DIAGNOSIS — R1013 Epigastric pain: Secondary | ICD-10-CM

## 2013-12-16 DIAGNOSIS — M79609 Pain in unspecified limb: Secondary | ICD-10-CM

## 2013-12-16 DIAGNOSIS — Z48812 Encounter for surgical aftercare following surgery on the circulatory system: Secondary | ICD-10-CM

## 2013-12-16 DIAGNOSIS — I739 Peripheral vascular disease, unspecified: Secondary | ICD-10-CM

## 2013-12-16 NOTE — Progress Notes (Signed)
The patient is here today for his first postoperative followup.  On 11/22/2013 he underwent an aortobifemoral bypass graft with extensive right common femoral and profunda femoral endarterectomy.  I also performed an aorta is too superior mesenteric artery bypass graft.  I reimplanted his inferior mesenteric artery.  On postoperative day 2, the patient had a stroke.  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  Lower extremity Doppler studies revealed a femoral DVT.  This was felt to be the origin of his stroke.  He had mild carotid disease.  His stroke left him with left facial droop.  The patient was seen in the emergency department for abdominal pain.  This was diagnosed as incisional pain.  Additional oxycodone was administered.  The patient is here today still complaining of abdominal pain in the left lower quadrant.  He has a history of diverticulitis.  He was placed on Cipro and Flagyl prior to his operation.  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  He is having a bowel movement every other day.  His appetite has been very poor as he states this causes significant pain.  He is ambulating better and is able to walk longer distances without pain in his legs.  On examination his midline incision is well-healed.  His femoral incisions are well healed.  His abdomen is soft.  I am going to refer him to GI medicine for further evaluation of his abdominal pain.  The pain he is having now is similar to what he was experiencing prior to his operation, which I felt was ischemic and given his occluded superior mesenteric artery.  This may be related to IBS or another GI pathology which I will have them evaluate.  I have stressed the importance of small meals and nutritional supplements such as boost to make sure that he doesn't get to malnourished.  I have given him 60 oxycodone 5 mg tablets today to help him  with his pain.  I scheduled him to come by to see me in 3 weeks.

## 2013-12-17 ENCOUNTER — Telehealth: Payer: Self-pay | Admitting: Family Medicine

## 2013-12-17 NOTE — Addendum Note (Signed)
Addended by: Mena Goes on: 12/17/2013 08:06 AM   Modules accepted: Orders

## 2013-12-18 NOTE — Telephone Encounter (Signed)
For [employer]

## 2013-12-20 ENCOUNTER — Ambulatory Visit (INDEPENDENT_AMBULATORY_CARE_PROVIDER_SITE_OTHER): Payer: Medicare Other | Admitting: Gastroenterology

## 2013-12-20 ENCOUNTER — Telehealth: Payer: Self-pay | Admitting: Gastroenterology

## 2013-12-20 ENCOUNTER — Encounter: Payer: Self-pay | Admitting: Gastroenterology

## 2013-12-20 VITALS — BP 130/80 | HR 96 | Ht 66.0 in | Wt 127.1 lb

## 2013-12-20 DIAGNOSIS — R1032 Left lower quadrant pain: Secondary | ICD-10-CM

## 2013-12-20 DIAGNOSIS — R630 Anorexia: Secondary | ICD-10-CM

## 2013-12-20 MED ORDER — HYOSCYAMINE SULFATE 0.125 MG SL SUBL: 0.1250 mg | SUBLINGUAL_TABLET | Freq: Four times a day (QID) | SUBLINGUAL | Status: DC | PRN

## 2013-12-20 NOTE — Progress Notes (Signed)
12/20/2013 Perry Kennedy [id number] 1941/09/14   HISTORY OF PRESENT ILLNESS:  This is a 73 year old male who is known to Dr. Carlean Purl for previous colonoscopy.  His last colonoscopy was in June of 2010 at which time he was found to have a sessile polyp at the splenic flexure that was 2-3 cm and unable to be removed so the site was marked with SPOT tattoo. Also had a 6 mm sessile polyp in the sigmoid colon and was removed along with moderate diverticulosis in the sigmoid colon and internal hemorrhoids.   He underwent resection of that polyp, however, I cannot find details on that surgery at this time, other than that it was performed by Dr. Barry Dienes. He has extensive vascular disease and underwent an aortobifemoral bypass graft with extensive right common femoral and profunda femoral endarterectomy by Dr. Trula Slade on January 2 of this year. On postop day 2 he then had a CVA and upon evaluation was found to have a lower extremity DVT. He is now on Xarelto as well as Pletal. Followup in the vascular surgeons he was complaining of left lower quadrant abdominal pain and was referred back here by Dr. Trula Slade for further evaluation. They initially believed that his pain was secondary to mesenteric ischemia.  The patient and his wife say that the pain he is experiencing is the same pain that was present prior to his surgery. The pain has been present for several months has not changed post surgery. It is located in the left lower quadrant and is not affected by eating or bowel movements. The pain comes and goes mostly at night time and is described as being poked with a knife. He has been taking pain medication for this pain, but is asking for something more to help with the pain. He has mild nausea but no vomiting or fevers. He has constipation from the pain medications and has been taking distal softeners and MiraLax daily. His last bowel movement was yesterday; prior to that he had not moved his bowels in 3 days.  He has poor appetite and says that he just does not feel like eating. He apparently had lost almost 20 pounds but is actually up 3 pounds from his appointment with the vascular surgeons earlier this week, according to his wife. CT scan in November of 2014 was performed for evaluation of this left lower quadrant abdominal pain and did not show any acute causes for the pain at that time.   Past Medical History  Diagnosis Date  . Hyperlipidemia   . GERD (gastroesophageal reflux disease)   . Hypertension   . COPD (chronic obstructive pulmonary disease)   . Insomnia   . Gout   . DVT (deep venous thrombosis)   . Peripheral vascular disease   . Diabetes mellitus without complication   . Headache(784.0)     migranes- not in a few years  . Coronary artery disease   . AAA (abdominal aortic aneurysm)   . Colon polyps   . Cataract   . Depression    Past Surgical History  Procedure Laterality Date  . Hemicolectomy    . Aorta - bilateral femoral artery bypass graft N/A 11/22/2013    Procedure: AORTA BIFEMORAL BYPASS GRAFT; SUPERIOR MESENTERIC ARTERY BYPASS, INFERIOR MESENTERIC BYPASS;  Surgeon: Serafina Mitchell, MD;  Location: [place] [facility];  Service: Vascular;  Laterality: N/A;    reports that he has been smoking Cigarettes.  He has a 82.5 pack-year smoking history. He has never used smokeless tobacco.  He reports that he does not drink alcohol or use illicit drugs. family history includes CAD in his brother; COPD in his brother; Diabetes in his mother and son; Heart attack in his son; Heart disease in his brother, mother, and son; Heart disease (age of onset: 63) in his father; Hyperlipidemia in his daughter; Lung cancer in his brother. Allergies  Allergen Reactions  . Morphine And Related Other (See Comments)    "goes crazy"      Outpatient Encounter Prescriptions as of 12/20/2013  Medication Sig  . aspirin 325 MG tablet Take 325 mg by mouth daily.  Marland Kitchen atorvastatin (LIPITOR) 40 MG tablet Take 1 tablet  (40 mg total) by mouth daily.  . cilostazol (PLETAL) 100 MG tablet Take 1 tablet (100 mg total) by mouth 2 (two) times daily.  . citalopram (CELEXA) 20 MG tablet Take 1 tablet (20 mg total) by mouth daily.  Marland Kitchen lisinopril (PRINIVIL,ZESTRIL) 20 MG tablet Take 1 tablet (20 mg total) by mouth daily.  . metFORMIN (GLUCOPHAGE) 500 MG tablet Take by mouth 2 (two) times daily with a meal.  . omeprazole (PRILOSEC) 20 MG capsule Take 1 capsule (20 mg total) by mouth daily.  Marland Kitchen oxyCODONE (ROXICODONE) 5 MG immediate release tablet Take 1 tablet (5 mg total) by mouth every 4 (four) hours as needed for severe pain or breakthrough pain.  . ranitidine (ZANTAC) 150 MG tablet Take 1 tablet (150 mg total) by mouth 2 (two) times daily.  . Rivaroxaban (XARELTO) 15 MG TABS tablet Take 1 tablet (15 mg total) by mouth 2 (two) times daily with a meal.  . hyoscyamine (LEVSIN/SL) 0.125 MG SL tablet Place 1 tablet (0.125 mg total) under the tongue every 6 (six) hours as needed.  Derrill Memo ON 12/25/2013] Rivaroxaban (XARELTO) 20 MG TABS tablet Take 1 tablet (20 mg total) by mouth daily.  . [DISCONTINUED] diclofenac (VOLTAREN) 75 MG EC tablet TAKE ONE TABLET BY MOUTH TWICE DAILY  . [DISCONTINUED] oxyCODONE-acetaminophen (PERCOCET) 5-325 MG per tablet Take 1 tablet by mouth every 4 (four) hours as needed for severe pain.     REVIEW OF SYSTEMS  : All other systems reviewed and negative except where noted in the History of Present Illness.   PHYSICAL EXAM: BP 130/80  Pulse 96  Ht 5\' 6"  (1.676 m)  Wt 127 lb 1 oz (57.635 kg)  BMI 20.52 kg/m2 General:  Chronically ill appearing white male in no acute distress Head: Normocephalic and atraumatic Eyes:  Sclerae anicteric, conjunctiva pink. Ears: Normal auditory acuity. Lungs: Clear throughout to auscultation Heart: Regular rate and rhythm. Abdomen: Soft, non-distended.  BS present.  Mild diffuse TTP without R/R/G.  Laparotomy and inguinal incisions noted with good  healing. Musculoskeletal: Symmetrical with no gross deformities.  Skin: No lesions on visible extremities. Extremities: No edema.  Neurological: Alert oriented x 4, grossly non-focal Psychological:  Alert and cooperative. Normal mood and affect  ASSESSMENT AND PLAN: -LLQ abdominal pain:  Pain has been present for several months. It was initially thought to be due to his mesenteric ischemia, but the pain has not improved after his surgery. Pain is not changed by eating or bowel movements. Patient should have a repeat colonoscopy in the near future, however, at this time he is very high risk since he just had a stroke earlier this month as well as undergoing his extensive vascular surgery. He is currently on Pletal and Xarelto.  I offered to order a CAT scan for evaluation, however, patient's wife declined since  he had one in November for the same pain that was unremarkable. He does have pain medicine at home that he takes for the pain, but is asking for something else to help with it. I told him I could give him something for spasm and cramping therefore, we will send over a prescription for Levsin to take as needed when the pain occurs. -Constipation:  Question if this is contributing to his left lower quadrant abdominal pain, but he says that the pain is not relieved necessarily by bowel movements. He takes stool softeners and MiraLax daily. Advised him to increase the MiraLax possibly to twice a day. I also suggested that he drink a bottle of magnesium citrate on one occasion to help clean him out. -Poor appetite and weight loss:  Patient denies abdominal pain and nausea/vomiting with eating, but says she just does not feel like eating. His wife is asking for something for an appetite stimulant. I advised that I would check with Dr. Carlean Purl first before prescribing something, but could possibly consider a low dose of Remeron at bedtime.  *We has follow-up with Dr. Trula Slade in 3 weeks and we will bring  him back for follow-up with Dr. Carlean Purl in 4-6 weeks.

## 2013-12-20 NOTE — Telephone Encounter (Signed)
Left message stating that I would call back on Monday to discuss Dr. Celesta Aver recommendations regarding his medications.

## 2013-12-20 NOTE — Progress Notes (Signed)
Complicated patient - excellent summary and evaluation. Hard to know what is causing the pain - may be neuropathic or functional GI. Since he was just started on Celexa 1/16 too soon to say that will not help his appetite - as treatment of depression should. Can reserve Remeron (mirtazipine) if Celexa fails to work or defer to PCp to add Remeron to regimen which would improve appetite sooner that Celexa in most cases. I would not add Megace.  Gatha Mayer, MD, [facility]

## 2013-12-20 NOTE — Patient Instructions (Addendum)
You have been given a separate informational sheet regarding your tobacco use, the importance of quitting and local resources to help you quit.   We sent a prescription to [facility] . 1. Levsin SL 0.125 mg from abdominal pain, cramping.   Get a bottle of Magnesium Citrate at [facility] and drink for constipation.  Should have results within an hour or so.    We made you a follow up with Dr. Carlean Purl on 01-30-2014 at 11:30 am .

## 2013-12-23 ENCOUNTER — Telehealth: Payer: Self-pay | Admitting: Gastroenterology

## 2013-12-23 NOTE — Telephone Encounter (Signed)
Spoke with the patient and gave him Dr. Celesta Aver recommendations regarding indications for appetite stimulant. I told him that the Celexa, which was just started earlier in January may in fact help stimulate his appetite and will treat any underlying depression, which in turn then may stimulate his appetite as well. I asked that he then discuss any other medication additions with his PCP.

## 2013-12-25 ENCOUNTER — Telehealth: Payer: Self-pay | Admitting: Family Medicine

## 2013-12-27 ENCOUNTER — Other Ambulatory Visit: Payer: Self-pay | Admitting: General Practice

## 2013-12-27 NOTE — Telephone Encounter (Signed)
Script written on 12/13/13 by Stevan Born. She should follow up with prescribing provider.

## 2013-12-29 ENCOUNTER — Other Ambulatory Visit: Payer: Self-pay | Admitting: Gastroenterology

## 2013-12-30 ENCOUNTER — Other Ambulatory Visit: Payer: Self-pay | Admitting: Family Medicine

## 2013-12-30 MED ORDER — OXYCODONE HCL 5 MG PO TABS: 5.0000 mg | ORAL_TABLET | ORAL | Status: DC | PRN

## 2013-12-30 NOTE — Telephone Encounter (Signed)
Patient aware.

## 2014-01-03 ENCOUNTER — Encounter: Payer: Self-pay | Admitting: Surgery

## 2014-01-06 ENCOUNTER — Encounter: Payer: Self-pay | Admitting: Surgery

## 2014-01-06 ENCOUNTER — Ambulatory Visit (INDEPENDENT_AMBULATORY_CARE_PROVIDER_SITE_OTHER)
Admission: RE | Admit: 2014-01-06 | Discharge: 2014-01-06 | Disposition: A | Discharge status: Home / Self Care | Payer: Medicare Other | Source: Ambulatory Visit | Attending: Surgery | Admitting: Surgery

## 2014-01-06 ENCOUNTER — Other Ambulatory Visit: Payer: Self-pay | Admitting: Surgery

## 2014-01-06 ENCOUNTER — Ambulatory Visit (HOSPITAL_COMMUNITY)
Admission: RE | Admit: 2014-01-06 | Discharge: 2014-01-06 | Disposition: A | Discharge status: Home / Self Care | Payer: Medicare Other | Source: Ambulatory Visit | Attending: Surgery | Admitting: Surgery

## 2014-01-06 ENCOUNTER — Ambulatory Visit (INDEPENDENT_AMBULATORY_CARE_PROVIDER_SITE_OTHER): Payer: Self-pay | Admitting: Surgery

## 2014-01-06 VITALS — BP 143/78 | HR 102 | Ht 66.0 in | Wt 124.0 lb

## 2014-01-06 DIAGNOSIS — I739 Peripheral vascular disease, unspecified: Secondary | ICD-10-CM | POA: Insufficient documentation

## 2014-01-06 DIAGNOSIS — F17200 Nicotine dependence, unspecified, uncomplicated: Secondary | ICD-10-CM | POA: Insufficient documentation

## 2014-01-06 DIAGNOSIS — Z48812 Encounter for surgical aftercare following surgery on the circulatory system: Secondary | ICD-10-CM

## 2014-01-06 DIAGNOSIS — Z9889 Other specified postprocedural states: Secondary | ICD-10-CM | POA: Insufficient documentation

## 2014-01-06 DIAGNOSIS — K551 Chronic vascular disorders of intestine: Secondary | ICD-10-CM

## 2014-01-06 DIAGNOSIS — R1013 Epigastric pain: Secondary | ICD-10-CM

## 2014-01-06 DIAGNOSIS — I10 Essential (primary) hypertension: Secondary | ICD-10-CM | POA: Insufficient documentation

## 2014-01-06 DIAGNOSIS — E119 Type 2 diabetes mellitus without complications: Secondary | ICD-10-CM | POA: Insufficient documentation

## 2014-01-06 DIAGNOSIS — E785 Hyperlipidemia, unspecified: Secondary | ICD-10-CM | POA: Insufficient documentation

## 2014-01-06 NOTE — Progress Notes (Signed)
The patient comes back today for followup.  He is status post aortobifemoral bypass graft and aorta to superior mesenteric artery bypass graft with reimplantation of the inferior mesenteric artery.  This was performed on 11/22/2013.  He had an extensive endarterectomy on the right.  On postoperative day 2 he suffered a stroke.  During his workup he was found to have a patent foramen ovale.  Lower extremity Doppler studies revealed a left femoral DVT.  This was felt to be the etiology of his stroke.  He had mild carotid disease.  The stroke left him with a left facial droop.  He has recently been evaluated by GI.  They're planning a colonoscopy several weeks from now.  The patient is having normal bowel function.  Although he was 6 over the weekend with some diarrhea.  The patient continues to complain of decreased energy and poor appetite with left lower quadrant pain.  On examination his incisions have healed nicely.  His abdomen is soft.  Ultrasound studies were performed today.  This shows that the aorta to superior mesenteric artery bypass graft is patent.  There are elevated velocities proximally which likely corresponds to the residual plaque within the superior mesenteric artery.  His aortobifemoral bypass graft is widely patent.  Ankle brachia index is 0.55 on the right and 0.62 on the left.  I have stressed to the patient the importance of adequate nutrition.  I also told him I feel like he is getting depressed.  We talked about strategies to try to improve his activity level and increase his energy and appetite.   I have scheduled the patient to come back in 3 months. I gave him 50 oxycodone tablets today

## 2014-01-07 ENCOUNTER — Other Ambulatory Visit: Payer: Self-pay | Admitting: Gastroenterology

## 2014-01-08 NOTE — Telephone Encounter (Signed)
Ok to refill.  He needs to keep his appt with Dr. Carlean Purl in March.  Thank you,  Jess

## 2014-01-08 NOTE — Telephone Encounter (Signed)
Please advise regarding refill, thank you. 

## 2014-01-15 ENCOUNTER — Other Ambulatory Visit: Payer: Self-pay | Admitting: Family Medicine

## 2014-01-15 ENCOUNTER — Encounter: Payer: Self-pay | Admitting: Gastroenterology

## 2014-01-20 ENCOUNTER — Other Ambulatory Visit: Payer: Self-pay | Admitting: Family Medicine

## 2014-01-20 ENCOUNTER — Telehealth: Payer: Self-pay | Admitting: *Deleted

## 2014-01-20 NOTE — Telephone Encounter (Signed)
Pt was sent home from hospital on Zaralto 15mg  bid x 3 wks, then had an rx for Zaralto qd for 1 month. Is he supposed to continue this? Home health nurse needs to know, he is out

## 2014-01-21 ENCOUNTER — Other Ambulatory Visit: Payer: Self-pay | Admitting: Family Medicine

## 2014-01-21 NOTE — Telephone Encounter (Signed)
He needs to be on xarelto for at least 6 months due to DVT.  He is supposed to be taking xarelto 20mg  po qd

## 2014-01-22 NOTE — Progress Notes (Signed)
Script for Platel failed to go through electronically. Will order again. Patient should have a script for Xarelto 20mg  with 3 refills. Explained this to home health nurse and she will check with the pharmacy and the patient again. If they do not have it then we can authorize it.

## 2014-01-27 NOTE — Telephone Encounter (Signed)
Spoke with Perry Kennedy and she stated she took of this message and [name] was aware.

## 2014-01-30 ENCOUNTER — Ambulatory Visit: Payer: Self-pay | Admitting: Internal Medicine

## 2014-01-31 ENCOUNTER — Other Ambulatory Visit: Payer: Self-pay | Admitting: Gastroenterology

## 2014-02-02 ENCOUNTER — Other Ambulatory Visit: Payer: Self-pay | Admitting: Family Medicine

## 2014-02-02 ENCOUNTER — Other Ambulatory Visit: Payer: Self-pay | Admitting: Gastroenterology

## 2014-02-11 ENCOUNTER — Telehealth: Payer: Self-pay | Admitting: *Deleted

## 2014-02-11 ENCOUNTER — Ambulatory Visit (INDEPENDENT_AMBULATORY_CARE_PROVIDER_SITE_OTHER): Payer: Medicare Other | Admitting: Family Medicine

## 2014-02-11 ENCOUNTER — Encounter: Payer: Self-pay | Admitting: Family Medicine

## 2014-02-11 VITALS — BP 125/76 | HR 105 | Temp 98.2°F | Ht 66.0 in | Wt 123.6 lb

## 2014-02-11 DIAGNOSIS — K219 Gastro-esophageal reflux disease without esophagitis: Secondary | ICD-10-CM

## 2014-02-11 DIAGNOSIS — E119 Type 2 diabetes mellitus without complications: Secondary | ICD-10-CM

## 2014-02-11 DIAGNOSIS — K921 Melena: Secondary | ICD-10-CM

## 2014-02-11 DIAGNOSIS — R195 Other fecal abnormalities: Secondary | ICD-10-CM

## 2014-02-11 LAB — POCT CBC
Granulocyte percent: 70.9 %G (ref 37–80)
HCT, POC: 41.9 % — AB (ref 43.5–53.7)
Hemoglobin: 13.3 g/dL — AB (ref 14.1–18.1)
Lymph, poc: 1.9 (ref 0.6–3.4)
MCH, POC: 26.7 pg — AB (ref 27–31.2)
MCHC: 31.7 g/dL — AB (ref 31.8–35.4)
MCV: 84.1 fL (ref 80–97)
MPV: 5.8 fL (ref 0–99.8)
POC Granulocyte: 6.1 (ref 2–6.9)
POC LYMPH PERCENT: 21.9 %L (ref 10–50)
Platelet Count, POC: 316 10*3/uL (ref 142–424)
RBC: 5 M/uL (ref 4.69–6.13)
RDW, POC: 14.7 %
WBC: 8.6 10*3/uL (ref 4.6–10.2)

## 2014-02-11 LAB — POCT GLYCOSYLATED HEMOGLOBIN (HGB A1C): Hemoglobin A1C: 5.7

## 2014-02-11 MED ORDER — RIVAROXABAN 20 MG PO TABS: 20.0000 mg | ORAL_TABLET | Freq: Every day | ORAL | Status: DC

## 2014-02-11 MED ORDER — RANITIDINE HCL 150 MG PO TABS: 150.0000 mg | ORAL_TABLET | Freq: Two times a day (BID) | ORAL | Status: DC

## 2014-02-11 MED ORDER — METFORMIN HCL 500 MG PO TABS: 500.0000 mg | ORAL_TABLET | Freq: Two times a day (BID) | ORAL | Status: DC

## 2014-02-11 MED ORDER — LISINOPRIL 20 MG PO TABS: 20.0000 mg | ORAL_TABLET | Freq: Every day | ORAL | Status: DC

## 2014-02-11 NOTE — Progress Notes (Signed)
   Subjective:    Patient ID: DAEMIAN GAHM, male    DOB: [birth year], 73 y.o.   MRN: [id number]  HPI  This 73 y.o. male presents for evaluation of black tarry stools.  He has hx of CAD, CVA, PVD, and left lower extremity DVT. He underwent vascular surgery. He is due to see vascular surgeon Surgery in a month.    Review of Systems C/o rectal bleeding   No chest pain, SOB, HA, dizziness, vision change, N/V, diarrhea, constipation, dysuria, urinary urgency or frequency, myalgias, arthralgias or rash.  Objective:   Physical Exam  Vital signs noted  Well developed well nourished male.  HEENT - Head atraumatic Normocephalic                Eyes - PERRLA, Conjuctiva - clear Sclera- Clear EOMI                Ears - EAC's Wnl TM's Wnl Gross Hearing WNL                Throat - oropharanx wnl Respiratory - Lungs CTA bilateral Cardiac - RRR S1 and S2 without murmur GI - Abdomen soft Nontender and bowel sounds active x 4 Extremities - No edema. Rectal - Hemocult stool negative. Results for orders placed in visit on 02/11/14  POCT CBC      Result Value Ref Range   WBC 8.6  4.6 - 10.2 K/uL   Lymph, poc 1.9  0.6 - 3.4   POC LYMPH PERCENT 21.9  10 - 50 %L   POC MID %    0 - 12 %M   POC Granulocyte 6.1  2 - 6.9   Granulocyte percent 70.9  37 - 80 %G   RBC 5.0  4.69 - 6.13 M/uL   Hemoglobin 13.3 (*) 14.1 - 18.1 g/dL   HCT, POC 41.9 (*) 43.5 - 53.7 %   MCV 84.1  80 - 97 fL   MCH, POC 26.7 (*) 27 - 31.2 pg   MCHC 31.7 (*) 31.8 - 35.4 g/dL   RDW, POC 14.7     Platelet Count, POC 316.0  142 - 424 K/uL   MPV 5.8  0 - 99.8 fL  POCT GLYCOSYLATED HEMOGLOBIN (HGB A1C)      Result Value Ref Range   Hemoglobin A1C 5.7%        Assessment & Plan:  Black stools - Plan: POCT CBC  GERD (gastroesophageal reflux disease) - Plan: ranitidine (ZANTAC) 150 MG tablet  Diabetes - Plan: metFORMIN (GLUCOPHAGE) 500 MG tablet, lisinopril (PRINIVIL,ZESTRIL) 20 MG tablet, POCT glycosylated hemoglobin (Hb  A1C), CMP14+EGFR  Follow up in one month  Lysbeth Penner FNP

## 2014-02-11 NOTE — Telephone Encounter (Signed)
Advanced nurse reports patient is having dark stools. He is on Xarelto and ASA

## 2014-02-11 NOTE — Telephone Encounter (Signed)
Spoke with pt's son regarding pt having dark stool Informed son that pt needs to come in for office visit today so CBC can be checked [employer]

## 2014-02-12 ENCOUNTER — Telehealth: Payer: Self-pay | Admitting: *Deleted

## 2014-02-12 LAB — CMP14+EGFR
ALT: 17 IU/L (ref 0–44)
AST: 20 IU/L (ref 0–40)
Albumin/Globulin Ratio: 1.9 (ref 1.1–2.5)
Albumin: 4.4 g/dL (ref 3.5–4.8)
Alkaline Phosphatase: 65 IU/L (ref 39–117)
BUN/Creatinine Ratio: 27 — ABNORMAL HIGH (ref 10–22)
BUN: 17 mg/dL (ref 8–27)
CO2: 25 mmol/L (ref 18–29)
Calcium: 10.5 mg/dL — ABNORMAL HIGH (ref 8.6–10.2)
Chloride: 91 mmol/L — ABNORMAL LOW (ref 97–108)
Creatinine, Ser: 0.64 mg/dL — ABNORMAL LOW (ref 0.76–1.27)
GFR calc Af Amer: 114 mL/min/{1.73_m2} (ref >59)
GFR calc non Af Amer: 99 mL/min/{1.73_m2} (ref >59)
Globulin, Total: 2.3 g/dL (ref 1.5–4.5)
Glucose: 120 mg/dL — ABNORMAL HIGH (ref 65–99)
Potassium: 4 mmol/L (ref 3.5–5.2)
Sodium: 137 mmol/L (ref 134–144)
Total Bilirubin: 0.3 mg/dL (ref 0.0–1.2)
Total Protein: 6.7 g/dL (ref 6.0–8.5)

## 2014-02-12 NOTE — Telephone Encounter (Signed)
Received fax from pharmacy requesting refill on indomethacin 50mg  caps. Take 1 cap q8hrs prn pain. Do not see on current med list. Please advise

## 2014-02-13 ENCOUNTER — Other Ambulatory Visit: Payer: Self-pay | Admitting: Family Medicine

## 2014-02-13 NOTE — Telephone Encounter (Signed)
He is on a blood thinner and I told him yesterday in the visit he does not need to be taking indocin.  He will need to take a pain medicine but not indocin

## 2014-02-13 NOTE — Telephone Encounter (Signed)
Left message on pharmacy voicemail with the information Perry Kennedy provided.

## 2014-02-21 ENCOUNTER — Other Ambulatory Visit: Payer: Self-pay | Admitting: Gastroenterology

## 2014-03-28 ENCOUNTER — Other Ambulatory Visit: Payer: Self-pay | Admitting: Gastroenterology

## 2014-04-03 ENCOUNTER — Telehealth: Payer: Self-pay | Admitting: Family Medicine

## 2014-04-04 ENCOUNTER — Other Ambulatory Visit: Payer: Self-pay | Admitting: Family Medicine

## 2014-04-04 ENCOUNTER — Encounter: Payer: Self-pay | Admitting: Surgery

## 2014-04-04 MED ORDER — OXYCODONE-ACETAMINOPHEN 5-325 MG PO TABS: 1.0000 | ORAL_TABLET | Freq: Four times a day (QID) | ORAL | Status: DC | PRN

## 2014-04-07 ENCOUNTER — Encounter: Payer: Self-pay | Admitting: Surgery

## 2014-04-07 ENCOUNTER — Ambulatory Visit (INDEPENDENT_AMBULATORY_CARE_PROVIDER_SITE_OTHER): Payer: Medicare Other | Admitting: Surgery

## 2014-04-07 ENCOUNTER — Telehealth: Payer: Self-pay | Admitting: Family Medicine

## 2014-04-07 VITALS — BP 125/87 | HR 114 | Ht 66.0 in | Wt 122.0 lb

## 2014-04-07 DIAGNOSIS — R1013 Epigastric pain: Secondary | ICD-10-CM

## 2014-04-07 NOTE — Progress Notes (Signed)
    Established Abdominal Aortic Aneurysm  History of Present Illness  The patient is a 73 y.o. (May 17, 1941) male He is status post aortobifemoral bypass graft and aorta to superior mesenteric artery bypass graft with reimplantation of the inferior mesenteric artery. This was performed on 11/22/2013.  On postoperative day 2 he suffered a stroke. During his workup he was found to have a patent foramen ovale. Lower extremity Doppler studies revealed a left femoral DVT. This was felt to be the etiology of his stroke. He had mild carotid disease. The stroke left him with a left facial droop. He has recently been evaluated by GI. They're planning a colonoscopy several weeks from now.   His chief complaint is abdominal pain and he doesn't have and appetite, nor does he get any sleep at night.  He tends to take short naps.  He feels anxious and that he can't do anything anymore.  He is hopeless.  The patient's PMH, PSH, SH, FamHx, Med, and Allergies are unchanged from 01/06/2014.  Still pending GI work up colonoscopy.    On ROS today: No fever, chills, D,N/V,  Abdominal pain not changed by PO intake.  Depressed.  Physical Examination  Filed Vitals:   04/07/14 1353  BP: 125/87  Pulse: 114  Height: 5\' 6"  (1.676 m)  Weight: 122 lb (55.339 kg)  SpO2: 100%   Body mass index is 19.7 kg/(m^2).  General: A&O x 3, WD Somulent,  Pulmonary: Sym exp, good air movt, CTA   Cardiac: RRR Musculoskeletal 5/5 no weakness noted  Vascular palpable femoral bilateral, feet are warm and well perfused.  Incisions well healed.    Abdomin +BS, no point tenderness general right and left LEQ tenderness.   A/P: Aortobifemoral bypass graft with extensive right common femoral   He is depressed and doesn't participate in enjoyable everyday activities.  He is able to perform minimal ADL's.  We fill at this point he should follow up with his primary care physician for a work up on depression.  He should also  follow up with the Gastroenterologist for his GI work up.  He will follow up with Dr. Trula Slade in 3 months  He was given a prescription for Ambien 5 mg # 30 PRN for insomnia. Seen in conjunction with Dr. Trula Slade today.   Ulyses Amor PA-C  [facility] Office: [phone]   04/07/2014, 2:20 PM  I agree with the above.  I have seen and examined the patient.  He is still having trouble with his appetite although he does drink boost/N. Shuler several times a day.  He complains of left lower quadrant pain.  Despite him not eating he did ask again 1 pound since his last visit.  On examination his midline incision is well-healed.  There is no evidence of hernia.  He has palpable femoral pulses.  If the patient needs to undergo colonoscopy, I would okay with that. I again discussed the fact that think he is depressed.  I have recommended having his depression medication modify by his medical doctor.  I will have him follow up again in 3 months.  Annamarie Major

## 2014-04-08 NOTE — Telephone Encounter (Signed)
Spoke with pt's wife to notify that pt needs appt Verbalizes understanding

## 2014-04-08 NOTE — Telephone Encounter (Signed)
Patient aware will call back later to make an appointment

## 2014-04-08 NOTE — Telephone Encounter (Signed)
Follow up.

## 2014-04-15 ENCOUNTER — Other Ambulatory Visit: Payer: Self-pay | Admitting: Family Medicine

## 2014-04-18 ENCOUNTER — Telehealth: Payer: Self-pay | Admitting: Internal Medicine

## 2014-04-18 NOTE — Telephone Encounter (Signed)
Patient wants to have a colonoscopy.  He recently had vascular surgery and Dr. Trula Slade says ok to proceed with a colonoscopy.  Patient is on xeralto.  He will come in on 04/22/14 3:45 to discuss

## 2014-04-22 ENCOUNTER — Other Ambulatory Visit (INDEPENDENT_AMBULATORY_CARE_PROVIDER_SITE_OTHER): Payer: Medicare Other

## 2014-04-22 ENCOUNTER — Encounter: Payer: Self-pay | Admitting: Internal Medicine

## 2014-04-22 ENCOUNTER — Ambulatory Visit (INDEPENDENT_AMBULATORY_CARE_PROVIDER_SITE_OTHER): Payer: Medicare Other | Admitting: Internal Medicine

## 2014-04-22 VITALS — BP 96/60 | HR 104 | Ht 65.5 in | Wt 123.4 lb

## 2014-04-22 DIAGNOSIS — R634 Abnormal weight loss: Secondary | ICD-10-CM

## 2014-04-22 DIAGNOSIS — K551 Chronic vascular disorders of intestine: Secondary | ICD-10-CM

## 2014-04-22 DIAGNOSIS — R1030 Lower abdominal pain, unspecified: Secondary | ICD-10-CM

## 2014-04-22 DIAGNOSIS — F329 Major depressive disorder, single episode, unspecified: Secondary | ICD-10-CM

## 2014-04-22 DIAGNOSIS — R109 Unspecified abdominal pain: Secondary | ICD-10-CM

## 2014-04-22 DIAGNOSIS — F32A Depression, unspecified: Secondary | ICD-10-CM

## 2014-04-22 LAB — CBC WITH DIFFERENTIAL/PLATELET
Basophils Absolute: 0 10*3/uL (ref 0.0–0.1)
Basophils Relative: 0.4 % (ref 0.0–3.0)
EOS PCT: 4.1 % (ref 0.0–5.0)
Eosinophils Absolute: 0.4 10*3/uL (ref 0.0–0.7)
HEMATOCRIT: 38.2 % — AB (ref 39.0–52.0)
Hemoglobin: 13.2 g/dL (ref 13.0–17.0)
LYMPHS ABS: 1.7 10*3/uL (ref 0.7–4.0)
Lymphocytes Relative: 19.6 % (ref 12.0–46.0)
MCHC: 34.6 g/dL (ref 30.0–36.0)
MCV: 80.9 fl (ref 78.0–100.0)
Monocytes Absolute: 0.4 10*3/uL (ref 0.1–1.0)
Monocytes Relative: 4.4 % (ref 3.0–12.0)
Neutro Abs: 6.3 10*3/uL (ref 1.4–7.7)
Neutrophils Relative %: 71.5 % (ref 43.0–77.0)
PLATELETS: 272 10*3/uL (ref 150.0–400.0)
RBC: 4.72 Mil/uL (ref 4.22–5.81)
RDW: 16.5 % — ABNORMAL HIGH (ref 11.5–15.5)
WBC: 8.8 10*3/uL (ref 4.0–10.5)

## 2014-04-22 LAB — COMPREHENSIVE METABOLIC PANEL
ALK PHOS: 57 U/L (ref 39–117)
ALT: 24 U/L (ref 0–53)
AST: 21 U/L (ref 0–37)
Albumin: 3.9 g/dL (ref 3.5–5.2)
BILIRUBIN TOTAL: 0.5 mg/dL (ref 0.2–1.2)
BUN: 27 mg/dL — ABNORMAL HIGH (ref 6–23)
CO2: 28 mEq/L (ref 19–32)
Calcium: 10.1 mg/dL (ref 8.4–10.5)
Chloride: 93 mEq/L — ABNORMAL LOW (ref 96–112)
Creatinine, Ser: 0.7 mg/dL (ref 0.4–1.5)
GFR: 114.16 mL/min (ref >60.00)
Glucose, Bld: 99 mg/dL (ref 70–99)
Potassium: 4.2 mEq/L (ref 3.5–5.1)
SODIUM: 130 meq/L — AB (ref 135–145)
TOTAL PROTEIN: 7.1 g/dL (ref 6.0–8.3)

## 2014-04-22 LAB — LIPASE: Lipase: 43 U/L (ref 11.0–59.0)

## 2014-04-22 LAB — AMYLASE: AMYLASE: 103 U/L (ref 27–131)

## 2014-04-22 MED ORDER — OXYCODONE HCL 10 MG PO TABS: 10.0000 mg | ORAL_TABLET | ORAL | Status: DC | PRN

## 2014-04-22 MED ORDER — MIRTAZAPINE 15 MG PO TABS: 15.0000 mg | ORAL_TABLET | Freq: Every day | ORAL | Status: DC

## 2014-04-22 NOTE — Patient Instructions (Addendum)
You have been given a separate informational sheet regarding your tobacco use, the importance of quitting and local resources to help you quit.   Your physician has requested that you go to the basement for the following lab work before leaving today: Amylase, Lipase, CMET, CBC  You have been scheduled for a CT scan of the abdomen and pelvis at [facility] [place] ([address]---this is in the same building as Press photographer).   You are scheduled on 04/24/14 at 3:30pm. You should arrive 15 minutes prior to your appointment time for registration. Please follow the written instructions below on the day of your exam:  WARNING: IF YOU ARE ALLERGIC TO IODINE/X-RAY DYE, PLEASE NOTIFY RADIOLOGY IMMEDIATELY AT [phone]! YOU WILL BE GIVEN A 13 HOUR PREMEDICATION PREP.  1) Do not eat or drink anything after 11:30am (4 hours prior to your test) 2) You have been given 2 bottles of oral contrast to drink. The solution may taste  better if refrigerated, but do NOT add ice or any other liquid to this solution. Shake well before drinking.    Drink 1 bottle of contrast @ 1:30pm (2 hours prior to your exam)  Drink 1 bottle of contrast @ 2:30pm (1 hour prior to your exam)  You may take any medications as prescribed with a small amount of water except for the following: Metformin, Glucophage, Glucovance, Avandamet, Riomet, Fortamet, Actoplus Met, Janumet, Glumetza or Metaglip. The above medications must be held the day of the exam AND 48 hours after the exam.  The purpose of you drinking the oral contrast is to aid in the visualization of your intestinal tract. The contrast solution may cause some diarrhea. Before your exam is started, you will be given a small amount of fluid to drink. Depending on your individual set of symptoms, you may also receive an intravenous injection of x-ray contrast/dye. Plan on being at [facility] for 30 minutes or long, depending on the type of exam you are  having performed.  This test typically takes 30-45 minutes to complete.  If you have any questions regarding your exam or if you need to reschedule, you may call the CT department at [phone] between the hours of 8:00 am and 5:00 pm, Monday-Friday.  ________________________________________________________________________ We have given you a printed rx for oxycodone 28m.  Follow up with uKoreain a month.  I appreciate the opportunity to care for you.

## 2014-04-22 NOTE — Progress Notes (Signed)
    Subjective:    Patient ID: Perry Kennedy, male    DOB: 09-18-1941, 73 y.o.   MRN: [id number]  HPI The patient is here for f/u. He has a hx of surgery for aortic aneurysm. He has had anorexia and chronic lower abd pain Bloated, constipated frequently. Insomnia is severe. Wants stronger pain medication - pain same as before surgery. Has been treated with low dose SSRI - not helping. Was seen by [place] (Zehr) in Jan 0 just after starting - was to f/u in Feb/March but did not.  Wt Readings from Last 3 Encounters:  04/22/14 123 lb 6 oz (55.963 kg)  04/07/14 122 lb (55.339 kg)  02/11/14 123 lb 9.6 oz (56.065 kg)     Review of Systems As above    Objective:   Physical Exam General:  NAD Eyes:   anicteric Abdomen:  soft and nontender, BS+, no bruits Psych:  Flat affect    Data Reviewed:  Lab Results  Component Value Date   WBC 8.8 04/22/2014   HGB 13.2 04/22/2014   HCT 38.2* 04/22/2014   MCV 80.9 04/22/2014   PLT 272.0 04/22/2014      Chemistry      Component Value Date/Time   NA 130* 04/22/2014 1659   NA 137 02/11/2014 1806   K 4.2 04/22/2014 1659   CL 93* 04/22/2014 1659   CO2 28 04/22/2014 1659   BUN 27* 04/22/2014 1659   BUN 17 02/11/2014 1806   CREATININE 0.7 04/22/2014 1659      Component Value Date/Time   CALCIUM 10.1 04/22/2014 1659   ALKPHOS 57 04/22/2014 1659   AST 21 04/22/2014 1659   ALT 24 04/22/2014 1659   BILITOT 0.5 04/22/2014 1659            Assessment & Plan:  Abdominal pain, lower - Plan: Amylase, Lipase, Comprehensive metabolic panel, CBC with Differential, CT Abdomen Pelvis W Contrast  Loss of weight - Plan: Amylase, Lipase, Comprehensive metabolic panel, CBC with Differential, CT Abdomen Pelvis W Contrast  Depression - Plan: mirtazapine (REMERON) 15 MG tablet  mesenteric artery atherosclerosis  I think main problem is depression. Will Rx oxycodone 10 mg #90 x 1 only - may need pain clinic Await CT Colonoscopy for hx polyps at some point = doubt chronic  ischemia but possible  FB:[id number], Elenore Rota, MD Annamarie Major, MD

## 2014-04-24 ENCOUNTER — Ambulatory Visit (INDEPENDENT_AMBULATORY_CARE_PROVIDER_SITE_OTHER)
Admission: RE | Admit: 2014-04-24 | Discharge: 2014-04-24 | Disposition: A | Discharge status: Home / Self Care | Payer: Medicare Other | Source: Ambulatory Visit | Attending: Internal Medicine | Admitting: Internal Medicine

## 2014-04-24 DIAGNOSIS — R1030 Lower abdominal pain, unspecified: Secondary | ICD-10-CM

## 2014-04-24 DIAGNOSIS — R109 Unspecified abdominal pain: Secondary | ICD-10-CM

## 2014-04-24 DIAGNOSIS — R634 Abnormal weight loss: Secondary | ICD-10-CM

## 2014-04-24 MED ORDER — IOHEXOL 300 MG/ML  SOLN
100.0000 mL | Freq: Once | INTRAMUSCULAR | Status: AC | PRN
  Administered 2014-04-24: 80 mL via INTRAVENOUS

## 2014-04-29 NOTE — Progress Notes (Signed)
Quick Note:  CT ok Needs to schedule colonoscopy re: hx polyps  I hope mirtazipine is helping some (Remeron) Please ask - if not it may just take some more time to kick in - do not stop unless its causing problems ______

## 2014-05-27 ENCOUNTER — Encounter: Payer: Self-pay | Admitting: Internal Medicine

## 2014-05-27 ENCOUNTER — Ambulatory Visit (INDEPENDENT_AMBULATORY_CARE_PROVIDER_SITE_OTHER): Payer: Medicare Other | Admitting: Internal Medicine

## 2014-05-27 VITALS — BP 100/68 | HR 92 | Ht 65.5 in | Wt 123.2 lb

## 2014-05-27 DIAGNOSIS — F32A Depression, unspecified: Secondary | ICD-10-CM

## 2014-05-27 DIAGNOSIS — R1032 Left lower quadrant pain: Secondary | ICD-10-CM

## 2014-05-27 DIAGNOSIS — F329 Major depressive disorder, single episode, unspecified: Secondary | ICD-10-CM

## 2014-05-27 DIAGNOSIS — R630 Anorexia: Secondary | ICD-10-CM

## 2014-05-27 MED ORDER — MIRTAZAPINE 30 MG PO TABS: 30.0000 mg | ORAL_TABLET | Freq: Every day | ORAL | Status: DC

## 2014-05-27 NOTE — Patient Instructions (Addendum)
You have been given a separate informational sheet regarding your tobacco use, the importance of quitting and local resources to help you quit.  Glad you are better.  I changed the mirtazipine to 30 mg (increase in dose) and sent that prescription to [facility] electronically.  Please see Dr. Laurance Flatten or his partners for follow-up and refills of this medication and see me as needed.  I appreciate the opportunity to care for you. Gatha Mayer, MD, [facility]

## 2014-05-27 NOTE — Progress Notes (Signed)
   Subjective:    Patient ID: Perry Kennedy, male    DOB: 12/20/1940, 73 y.o.   MRN: [id number]  HPI  Here with son - f/u LLQ pain and weight loss. Resting better, no pain. Appetite increased. Weight is stable. Feels as well as he has in a while. Not using Ambien.    Review of Systems As above    Objective:   Physical Exam Thin,chronically ill NAD    Assessment & Plan:  LLQ abdominal pain resolved

## 2014-05-27 NOTE — Assessment & Plan Note (Signed)
resolved 

## 2014-05-28 DIAGNOSIS — F329 Major depressive disorder, single episode, unspecified: Secondary | ICD-10-CM | POA: Insufficient documentation

## 2014-05-28 DIAGNOSIS — F32A Depression, unspecified: Secondary | ICD-10-CM | POA: Insufficient documentation

## 2014-05-28 NOTE — Assessment & Plan Note (Signed)
Improved on mirtazipine - still not sleeping as well as he could Increase mirtazipine to 30 mg qhs F/U PCP RTC GI prn

## 2014-05-28 NOTE — Assessment & Plan Note (Signed)
Much better on mirtazipine Will increase to 30 mg qhs

## 2014-06-08 ENCOUNTER — Other Ambulatory Visit: Payer: Self-pay | Admitting: Family Medicine

## 2014-06-22 ENCOUNTER — Other Ambulatory Visit: Payer: Self-pay | Admitting: Family Medicine

## 2014-06-24 NOTE — Telephone Encounter (Signed)
No lipids in epic.

## 2014-06-30 ENCOUNTER — Other Ambulatory Visit: Payer: Self-pay | Admitting: Family Medicine

## 2014-06-30 NOTE — Telephone Encounter (Signed)
Last seen 02/11/14  B [place]  Last lipid 11/24/13

## 2014-07-04 ENCOUNTER — Telehealth: Payer: Self-pay | Admitting: Family Medicine

## 2014-07-04 NOTE — Telephone Encounter (Signed)
We need to se patient for pain med rx- according to records he was getting for abd pain- Dr. Carlean Purl said he could not find reason for pain

## 2014-07-04 NOTE — Telephone Encounter (Signed)
PAtient aware 

## 2014-07-04 NOTE — Telephone Encounter (Signed)
Patient aware.

## 2014-07-06 ENCOUNTER — Other Ambulatory Visit: Payer: Self-pay | Admitting: Family Medicine

## 2014-07-07 ENCOUNTER — Ambulatory Visit (INDEPENDENT_AMBULATORY_CARE_PROVIDER_SITE_OTHER): Payer: Medicare Other | Admitting: Nurse Practitioner

## 2014-07-07 ENCOUNTER — Encounter: Payer: Self-pay | Admitting: Nurse Practitioner

## 2014-07-07 VITALS — BP 129/77 | HR 110 | Temp 97.8°F | Ht 65.5 in | Wt 126.0 lb

## 2014-07-07 DIAGNOSIS — E11610 Type 2 diabetes mellitus with diabetic neuropathic arthropathy: Secondary | ICD-10-CM

## 2014-07-07 DIAGNOSIS — E785 Hyperlipidemia, unspecified: Secondary | ICD-10-CM

## 2014-07-07 DIAGNOSIS — F32A Depression, unspecified: Secondary | ICD-10-CM

## 2014-07-07 DIAGNOSIS — F3289 Other specified depressive episodes: Secondary | ICD-10-CM

## 2014-07-07 DIAGNOSIS — I10 Essential (primary) hypertension: Secondary | ICD-10-CM

## 2014-07-07 DIAGNOSIS — E119 Type 2 diabetes mellitus without complications: Secondary | ICD-10-CM

## 2014-07-07 DIAGNOSIS — I2510 Atherosclerotic heart disease of native coronary artery without angina pectoris: Secondary | ICD-10-CM

## 2014-07-07 DIAGNOSIS — I639 Cerebral infarction, unspecified: Secondary | ICD-10-CM

## 2014-07-07 DIAGNOSIS — I6350 Cerebral infarction due to unspecified occlusion or stenosis of unspecified cerebral artery: Secondary | ICD-10-CM

## 2014-07-07 DIAGNOSIS — K219 Gastro-esophageal reflux disease without esophagitis: Secondary | ICD-10-CM

## 2014-07-07 DIAGNOSIS — R1032 Left lower quadrant pain: Secondary | ICD-10-CM

## 2014-07-07 DIAGNOSIS — E1149 Type 2 diabetes mellitus with other diabetic neurological complication: Secondary | ICD-10-CM

## 2014-07-07 DIAGNOSIS — F329 Major depressive disorder, single episode, unspecified: Secondary | ICD-10-CM

## 2014-07-07 LAB — POCT GLYCOSYLATED HEMOGLOBIN (HGB A1C): Hemoglobin A1C: 5.8

## 2014-07-07 MED ORDER — OMEPRAZOLE 20 MG PO CPDR: DELAYED_RELEASE_CAPSULE | ORAL | Status: DC

## 2014-07-07 MED ORDER — RANITIDINE HCL 150 MG PO TABS: 150.0000 mg | ORAL_TABLET | Freq: Two times a day (BID) | ORAL | Status: DC

## 2014-07-07 MED ORDER — HYOSCYAMINE SULFATE 0.125 MG SL SUBL: SUBLINGUAL_TABLET | SUBLINGUAL | Status: DC

## 2014-07-07 MED ORDER — METFORMIN HCL 500 MG PO TABS: 500.0000 mg | ORAL_TABLET | Freq: Two times a day (BID) | ORAL | Status: DC

## 2014-07-07 MED ORDER — RIVAROXABAN 20 MG PO TABS: 20.0000 mg | ORAL_TABLET | Freq: Every day | ORAL | Status: DC

## 2014-07-07 MED ORDER — CITALOPRAM HYDROBROMIDE 20 MG PO TABS: 20.0000 mg | ORAL_TABLET | Freq: Every day | ORAL | Status: DC

## 2014-07-07 MED ORDER — ATORVASTATIN CALCIUM 40 MG PO TABS: ORAL_TABLET | ORAL | Status: DC

## 2014-07-07 MED ORDER — OXYCODONE HCL 10 MG PO TABS: 10.0000 mg | ORAL_TABLET | ORAL | Status: DC | PRN

## 2014-07-07 MED ORDER — MIRTAZAPINE 30 MG PO TABS: 30.0000 mg | ORAL_TABLET | Freq: Every day | ORAL | Status: DC

## 2014-07-07 MED ORDER — LISINOPRIL 20 MG PO TABS: ORAL_TABLET | ORAL | Status: DC

## 2014-07-07 MED ORDER — CILOSTAZOL 100 MG PO TABS: 100.0000 mg | ORAL_TABLET | Freq: Two times a day (BID) | ORAL | Status: DC

## 2014-07-07 NOTE — Patient Instructions (Signed)

## 2014-07-07 NOTE — Progress Notes (Signed)
Subjective:    Patient ID: Perry Kennedy, male    DOB: [birth year], 73 y.o.   MRN: [id number]  HPI Patient was scheduled for pain medication refill but when he got here he needed a full follow up and that is what he thought he was here for- He has been having left lower quadrant pain and has been seeing [facility] and he cannot find anything causing the pain- They want to do a colonoscopy bit patient is unable to swallow all of the prep to clean his colon out- They have him on oxycodone and they told him to come here for refills. He has multiple medical problems including diabetes- blood sugars are not being checked and does nit watch his diet. He says that he just doesn't feel well anymore. Patient Active Problem List   Diagnosis Date Noted  . Depression 05/28/2014  . Abdominal pain, epigastric 01/06/2014  . LLQ abdominal pain 12/20/2013  . Poor appetite 12/20/2013  . Aftercare following surgery of the circulatory system, [place] 12/16/2013  . Pain in limb-Abdomen 12/16/2013  . Acute CVA (cerebrovascular accident) 11/25/2013  . Low urine output 11/23/2013  . Diabetes 11/23/2013  . 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 COLONIC POLYPS, ADENOMATOUS, HX OF 04/23/2009  . DYSLIPIDEMIA 01/03/2008  . SMOKER 01/03/2008  . HYPERTENSION 01/03/2008  . CAD 01/03/2008  . CAROTID ARTERY DISEASE 01/03/2008  . PERIPHERAL VASCULAR DISEASE 01/03/2008  . INTERNAL HEMORRHOIDS 01/03/2008  . ACID REFLUX DISEASE 01/03/2008   Outpatient Encounter Prescriptions as of 07/07/2014  Medication Sig  . atorvastatin (LIPITOR) 40 MG tablet TAKE ONE TABLET BY MOUTH ONCE DAILY  . cilostazol (PLETAL) 100 MG tablet Take 100 mg by mouth 2 (two) times daily.  . citalopram (CELEXA) 20 MG tablet Take 1 tablet (20 mg total) by mouth daily.  . diclofenac (VOLTAREN) 75 MG EC tablet Take 75 mg by mouth 3 (three) times daily.   . hyoscyamine (LEVSIN SL) 0.125 MG SL tablet  DISSOLVE ONE TABLET UNDER THE TONGUE EVERY 6 HOURS AS NEEDED  . lisinopril (PRINIVIL,ZESTRIL) 20 MG tablet TAKE ONE TABLET BY MOUTH ONCE DAILY  . metFORMIN (GLUCOPHAGE) 500 MG tablet Take 1 tablet (500 mg total) by mouth 2 (two) times daily with a meal.  . mirtazapine (REMERON) 30 MG tablet Take 1 tablet (30 mg total) by mouth at bedtime.  [place] Kitchen omeprazole (PRILOSEC) 20 MG capsule TAKE ONE CAPSULE BY MOUTH ONCE DAILY  . Oxycodone HCl 10 MG TABS Take 1 tablet (10 mg total) by mouth every 4 (four) hours as needed (pain).  . ranitidine (ZANTAC) 150 MG tablet Take 1 tablet (150 mg total) by mouth 2 (two) times daily.  . Rivaroxaban (XARELTO) 20 MG TABS tablet Take 1 tablet (20 mg total) by mouth daily.  [place] Kitchen zolpidem (AMBIEN) 5 MG tablet Take 5 mg by mouth at bedtime.   . [DISCONTINUED] lisinopril (PRINIVIL,ZESTRIL) 20 MG tablet TAKE ONE TABLET BY MOUTH ONCE DAILY  . oxyCODONE-acetaminophen (PERCOCET/ROXICET) 5-325 MG per tablet   . [DISCONTINUED] atorvastatin (LIPITOR) 40 MG tablet TAKE ONE TABLET BY MOUTH ONCE DAILY FOR CHOLESTEROL  . [DISCONTINUED] atorvastatin (LIPITOR) 40 MG tablet TAKE ONE TABLET BY MOUTH ONCE DAILY  . [DISCONTINUED] cilostazol (PLETAL) 100 MG tablet TAKE ONE TABLET BY MOUTH TWICE DAILY PATIENT  NEEDS  TO  BE  SEEN       Review of Systems  Constitutional: Negative.   HENT: Negative.   Respiratory: Negative.   Cardiovascular:  Negative.   Gastrointestinal: Positive for abdominal pain, constipation and abdominal distention.  Genitourinary: Negative.   Neurological: Negative.   Psychiatric/Behavioral: Negative.   All other systems reviewed and are negative.      Objective:   Physical Exam  Constitutional: He is oriented to person, place, and time. He appears well-developed and well-nourished.  Cardiovascular: Normal rate, regular rhythm and normal heart sounds.   Pulmonary/Chest: Effort normal and breath sounds normal.  Abdominal: Soft. Bowel sounds are normal. He  exhibits no mass. There is tenderness (llq). There is no rebound and no guarding.  Neurological: He is alert and oriented to person, place, and time. He has normal reflexes.  Skin: Skin is warm and dry.  Psychiatric: He has a normal mood and affect. His behavior is normal. Judgment and thought content normal.   BP 129/77  Pulse 110  Temp(Src) 97.8 F (36.6 C) (Oral)  Ht 5' 5.5" (1.664 m)  Wt 126 lb (57.153 kg)  BMI 20.64 kg/m2   Results for orders placed in visit on 07/07/14  POCT GLYCOSYLATED HEMOGLOBIN (HGB A1C)      Result Value Ref Range   Hemoglobin A1C 5.8          Assessment & Plan:   1. Type 2 diabetes mellitus without complication   2. DYSLIPIDEMIA   3. HYPERTENSION   4. Depression   5. CAD   6. Acute CVA (cerebrovascular accident)   7. LLQ abdominal pain   8. Type 2 diabetes mellitus with diabetic neuropathic arthropathy   9. Gastroesophageal reflux disease without esophagitis    Orders Placed This Encounter  Procedures  . CMP14+EGFR  . NMR, lipoprofile  . POCT glycosylated hemoglobin (Hb A1C)   Meds ordered this encounter  Medications  . DISCONTD: oxyCODONE-acetaminophen (PERCOCET/ROXICET) 5-325 MG per tablet    Sig:   . Oxycodone HCl 10 MG TABS    Sig: Take 1 tablet (10 mg total) by mouth every 4 (four) hours as needed (pain).    Dispense:  90 tablet    Refill:  0    Order Specific Question:  Supervising Provider    Answer:  Chipper Herb [[id number]]  . mirtazapine (REMERON) 30 MG tablet    Sig: Take 1 tablet (30 mg total) by mouth at bedtime.    Dispense:  30 tablet    Refill:  5    Order Specific Question:  Supervising Provider    Answer:  Chipper Herb [[id number]]  . metFORMIN (GLUCOPHAGE) 500 MG tablet    Sig: Take 1 tablet (500 mg total) by mouth 2 (two) times daily with a meal.    Dispense:  60 tablet    Refill:  5    Order Specific Question:  Supervising Provider    Answer:  Chipper Herb [[id number]]  . ranitidine (ZANTAC) 150 MG tablet    Sig:  Take 1 tablet (150 mg total) by mouth 2 (two) times daily.    Dispense:  60 tablet    Refill:  5    Order Specific Question:  Supervising Provider    Answer:  Chipper Herb [[id number]]  . rivaroxaban (XARELTO) 20 MG TABS tablet    Sig: Take 1 tablet (20 mg total) by mouth daily.    Dispense:  30 tablet    Refill:  3    START THIS MEDICATION ON 12/25/13 WHEN YOU ARE FINISHED WITH XARELTO 15 MG TWICE DAILY.    Order Specific Question:  Supervising Provider    Answer:  Redge Gainer W [[id number]]  . omeprazole (PRILOSEC) 20 MG capsule    Sig: TAKE ONE CAPSULE BY MOUTH ONCE DAILY    Dispense:  30 capsule    Refill:  5    Order Specific Question:  Supervising Provider    Answer:  Chipper Herb [[id number]]  . lisinopril (PRINIVIL,ZESTRIL) 20 MG tablet    Sig: TAKE ONE TABLET BY MOUTH ONCE DAILY    Dispense:  30 tablet    Refill:  5    Order Specific Question:  Supervising Provider    Answer:  Chipper Herb [[id number]]  . hyoscyamine (LEVSIN SL) 0.125 MG SL tablet    Sig: DISSOLVE ONE TABLET UNDER THE TONGUE EVERY 6 HOURS AS NEEDED    Dispense:  30 tablet    Refill:  5    Order Specific Question:  Supervising Provider    Answer:  Chipper Herb [[id number]]  . citalopram (CELEXA) 20 MG tablet    Sig: Take 1 tablet (20 mg total) by mouth daily.    Dispense:  30 tablet    Refill:  11    Order Specific Question:  Supervising Provider    Answer:  Chipper Herb [[id number]]  . cilostazol (PLETAL) 100 MG tablet    Sig: Take 1 tablet (100 mg total) by mouth 2 (two) times daily.    Dispense:  30 tablet    Refill:  5    Order Specific Question:  Supervising Provider    Answer:  Chipper Herb [[id number]]  . atorvastatin (LIPITOR) 40 MG tablet    Sig: TAKE ONE TABLET BY MOUTH ONCE DAILY    Dispense:  30 tablet    Refill:  5    Order Specific Question:  Supervising Provider    Answer:  Chipper Herb [[id number]]   Patient was told that he could not get pain meds form any other dr. From now on Labs pending Health  maintenance reviewed Diet and exercise encouraged Continue all meds Follow up  In 3 months   [place], FNP

## 2014-07-08 LAB — NMR, LIPOPROFILE
Cholesterol: 106 mg/dL (ref 100–199)
HDL Cholesterol by NMR: 37 mg/dL — ABNORMAL LOW (ref >39)
HDL PARTICLE NUMBER: 29.8 umol/L — AB (ref >=30.5)
LDL Particle Number: 623 nmol/L (ref <1000)
LDL SIZE: 19.8 nm (ref >20.5)
LDLC SERPL CALC-MCNC: 42 mg/dL (ref 0–99)
LP-IR SCORE: 62 — AB (ref <=45)
SMALL LDL PARTICLE NUMBER: 426 nmol/L (ref <=527)
Triglycerides by NMR: 135 mg/dL (ref 0–149)

## 2014-07-08 LAB — CMP14+EGFR
ALK PHOS: 91 IU/L (ref 39–117)
ALT: 19 IU/L (ref 0–44)
AST: 19 IU/L (ref 0–40)
Albumin/Globulin Ratio: 1.5 (ref 1.1–2.5)
Albumin: 4.3 g/dL (ref 3.5–4.8)
BILIRUBIN TOTAL: 0.3 mg/dL (ref 0.0–1.2)
BUN / CREAT RATIO: 22 (ref 10–22)
BUN: 19 mg/dL (ref 8–27)
CO2: 23 mmol/L (ref 18–29)
CREATININE: 0.87 mg/dL (ref 0.76–1.27)
Calcium: 10.3 mg/dL — ABNORMAL HIGH (ref 8.6–10.2)
Chloride: 92 mmol/L — ABNORMAL LOW (ref 97–108)
GFR calc Af Amer: 100 mL/min/{1.73_m2} (ref >59)
GFR calc non Af Amer: 87 mL/min/{1.73_m2} (ref >59)
GLOBULIN, TOTAL: 2.8 g/dL (ref 1.5–4.5)
Glucose: 148 mg/dL — ABNORMAL HIGH (ref 65–99)
Potassium: 4.1 mmol/L (ref 3.5–5.2)
SODIUM: 133 mmol/L — AB (ref 134–144)
Total Protein: 7.1 g/dL (ref 6.0–8.5)

## 2014-07-10 ENCOUNTER — Telehealth: Payer: Self-pay | Admitting: Nurse Practitioner

## 2014-07-11 ENCOUNTER — Encounter: Payer: Self-pay | Admitting: Surgery

## 2014-07-14 ENCOUNTER — Encounter: Payer: Self-pay | Admitting: Surgery

## 2014-07-14 ENCOUNTER — Ambulatory Visit (INDEPENDENT_AMBULATORY_CARE_PROVIDER_SITE_OTHER): Payer: Medicare Other | Admitting: Surgery

## 2014-07-14 VITALS — BP 107/75 | HR 100 | Temp 98.6°F | Resp 16 | Ht 66.0 in | Wt 127.0 lb

## 2014-07-14 DIAGNOSIS — R1013 Epigastric pain: Secondary | ICD-10-CM

## 2014-07-14 DIAGNOSIS — Z48812 Encounter for surgical aftercare following surgery on the circulatory system: Secondary | ICD-10-CM

## 2014-07-14 NOTE — Progress Notes (Signed)
Patient name: Perry Kennedy MRN: [id number] DOB: August 07, 1941 Sex: male     Chief Complaint  Patient presents with  . Follow-up    status post aortobifemoral bypass graft and aorta to superior mesenteric artery bypass graft with reimplantation of the inferior mesenteric artery    HISTORY OF PRESENT ILLNESS: The patient is a 73 y.o. (Aug 12, 1941) male He is status post aortobifemoral bypass graft and aorta to superior mesenteric artery bypass graft with reimplantation of the inferior mesenteric artery. This was performed on 11/22/2013. On postoperative day 2 he suffered a stroke. During his workup he was found to have a patent foramen ovale. Lower extremity Doppler studies revealed a left femoral DVT. This was felt to be the etiology of his stroke. He had mild carotid disease. The stroke left him with a left facial droop. He has recently been evaluated by GI.  His left lower quadrant pain has improved.  He states that he is actually tolerating oral intake better.  He has gained some weight.   Past Medical History  Diagnosis Date  . Hyperlipidemia   . GERD (gastroesophageal reflux disease)   . Hypertension   . COPD (chronic obstructive pulmonary disease)   . Insomnia   . Gout   . DVT (deep venous thrombosis)   . Peripheral vascular disease   . Diabetes mellitus without complication   . Headache(784.0)     migranes- not in a few years  . Coronary artery disease   . AAA (abdominal aortic aneurysm)   . Colon polyps     adenomatous  . Cataract   . Depression   . Cecal ulcer   . Diverticulosis   . Hemorrhoids     Past Surgical History  Procedure Laterality Date  . Hemicolectomy    . Aorta - bilateral femoral artery bypass graft N/A 11/22/2013    Procedure: AORTA BIFEMORAL BYPASS GRAFT; SUPERIOR MESENTERIC ARTERY BYPASS, INFERIOR MESENTERIC BYPASS;  Surgeon: Serafina Mitchell, MD;  Location: [facility];  Service: Vascular;  Laterality: N/A;  . Colonoscopy  10/25/2007, 04/28/2009  .  Esophagogastroduodenoscopy  01/03/2003    History   Social History  . Marital Status: Divorced    Spouse Name: N/A    Number of Children: 3  . Years of Education: N/A   Occupational History  . retired    Social History Main Topics  . Smoking status: Current Every Day Smoker -- 1.50 packs/day for 55 years    Types: Cigarettes  . Smokeless tobacco: Never Used  . Alcohol Use: No  . Drug Use: No  . Sexual Activity: Not on file   Other Topics Concern  . Not on file   Social History Narrative   Lives alone.     Family History  Problem Relation Age of Onset  . Heart disease Father 33    pacemaker  . Diabetes Mother     renal failure  . Heart disease Mother   . CAD Brother     died at 47  . COPD Brother   . Heart disease Brother   . Hyperlipidemia Daughter   . Diabetes Son   . Heart disease Son     before age 24  . Heart attack Son   . Lung cancer Brother     Allergies as of 07/14/2014 - Review Complete 07/14/2014  Allergen Reaction Noted  . Morphine and related Other (See Comments) 10/23/2013    Current Outpatient Prescriptions on File Prior to Visit  Medication Sig  Dispense Refill  . atorvastatin (LIPITOR) 40 MG tablet TAKE ONE TABLET BY MOUTH ONCE DAILY  30 tablet  5  . cilostazol (PLETAL) 100 MG tablet Take 1 tablet (100 mg total) by mouth 2 (two) times daily.  30 tablet  5  . citalopram (CELEXA) 20 MG tablet Take 1 tablet (20 mg total) by mouth daily.  30 tablet  11  . lisinopril (PRINIVIL,ZESTRIL) 20 MG tablet TAKE ONE TABLET BY MOUTH ONCE DAILY  30 tablet  5  . metFORMIN (GLUCOPHAGE) 500 MG tablet Take 1 tablet (500 mg total) by mouth 2 (two) times daily with a meal.  60 tablet  5  . mirtazapine (REMERON) 30 MG tablet Take 1 tablet (30 mg total) by mouth at bedtime.  30 tablet  5  . omeprazole (PRILOSEC) 20 MG capsule TAKE ONE CAPSULE BY MOUTH ONCE DAILY  30 capsule  5  . Oxycodone HCl 10 MG TABS Take 1 tablet (10 mg total) by mouth every 4 (four) hours as  needed (pain).  90 tablet  0  . ranitidine (ZANTAC) 150 MG tablet Take 1 tablet (150 mg total) by mouth 2 (two) times daily.  60 tablet  5  . rivaroxaban (XARELTO) 20 MG TABS tablet Take 1 tablet (20 mg total) by mouth daily.  30 tablet  3  . zolpidem (AMBIEN) 5 MG tablet Take 5 mg by mouth at bedtime.       . diclofenac (VOLTAREN) 75 MG EC tablet Take 75 mg by mouth 3 (three) times daily.       . hyoscyamine (LEVSIN SL) 0.125 MG SL tablet DISSOLVE ONE TABLET UNDER THE TONGUE EVERY 6 HOURS AS NEEDED  30 tablet  5   No current facility-administered medications on file prior to visit.     REVIEW OF SYSTEMS: Please see history of present illness, otherwise all are negative  PHYSICAL EXAMINATION:   Vital signs are BP 107/75  Pulse 100  Temp(Src) 98.6 F (37 C) (Oral)  Resp 16  Ht 5\' 6"  (1.676 m)  Wt 127 lb (57.607 kg)  BMI 20.51 kg/m2  SpO2 98% General: The patient appears their stated age. HEENT:  No gross abnormalities Pulmonary:  Non labored breathing Abdomen: Soft and non-tender.  Midline incision is well-healed without hernia Musculoskeletal: There are no major deformities. Neurologic: No focal weakness or paresthesias are detected, Skin: There are no ulcer or rashes noted. Psychiatric: The patient has normal affect. Cardiovascular: There is a regular rate and rhythm without significant murmur appreciated.  Palpable femoral pulses.  Pedal pulses are not palpable   Diagnostic Studies None  Assessment: Status post aortobifemoral bypass graft with SMA bypass, and reimplantation of the inferior mesenteric artery. Plan: Overall the patient has made improvement since the last time I saw him.  His pain has nearly resolved.  He is still taking some pain medication which appears to help him.  He is asking about coming off anticoagulation.  I'm going to have him see cardiology.  He has a history of a DVT in the perioperative period.  He had a stroke secondary to the DVT in patent  foramen ovale.  Per my perspective he can come off anticoagulation, I just what cleared by cardiology.  Otherwise, he'll follow up with me in one year  V. Leia Alf, M.D. [facility] Office: [phone] Pager:  [phone]

## 2014-07-14 NOTE — Addendum Note (Signed)
Addended by: Mena Goes on: 07/14/2014 03:31 PM   Modules accepted: Orders

## 2014-07-17 IMAGING — CR DG CHEST 2V
2 series · 2 of 2 positions shown · non-contrast
Comparison: CT scan 10/03/2013 and chest radiograph 08/25/2009

CLINICAL DATA: Preop radiographs, smoking history, no active chest
complaints

EXAM:
CHEST  2 VIEW

[w chest pa]
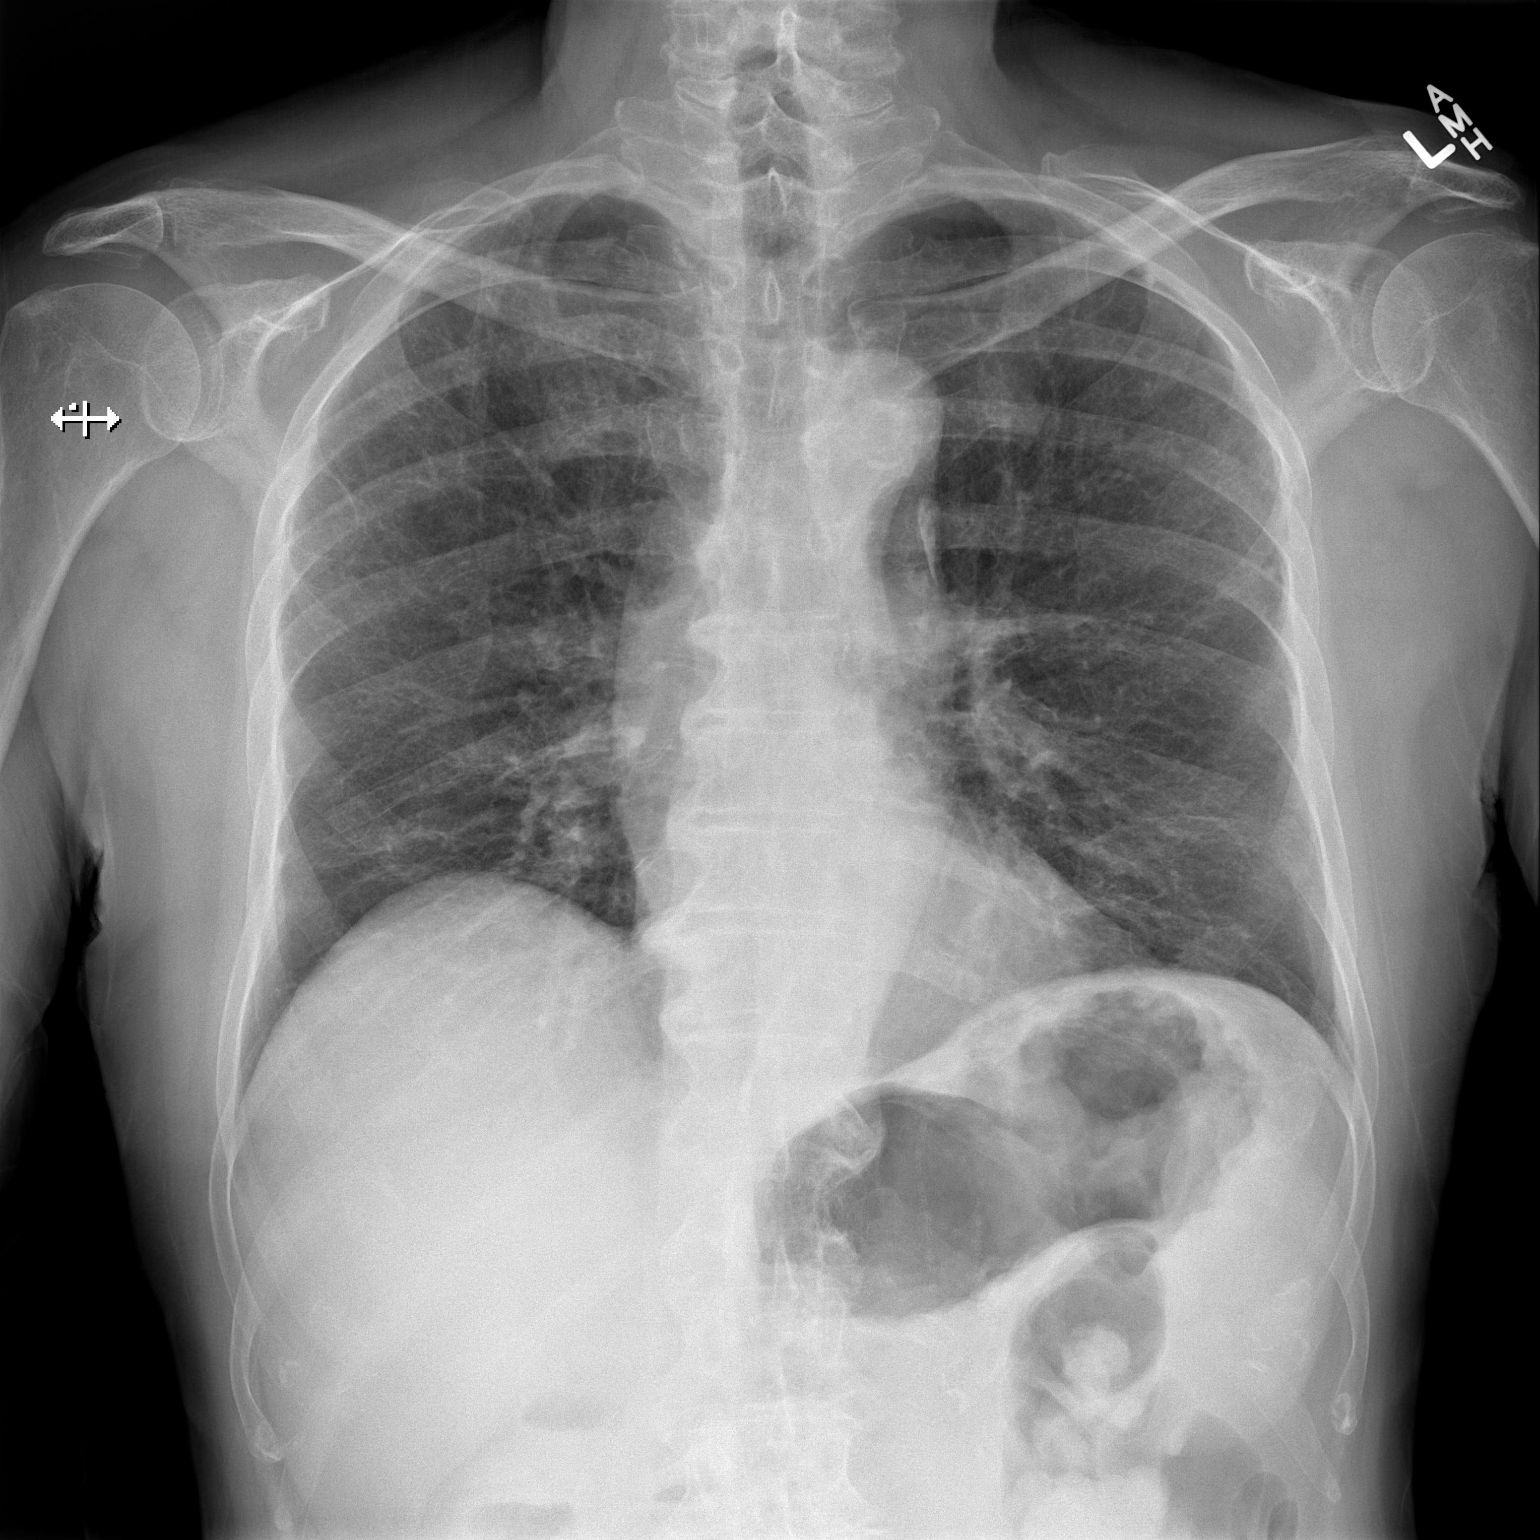

[w chest lat]
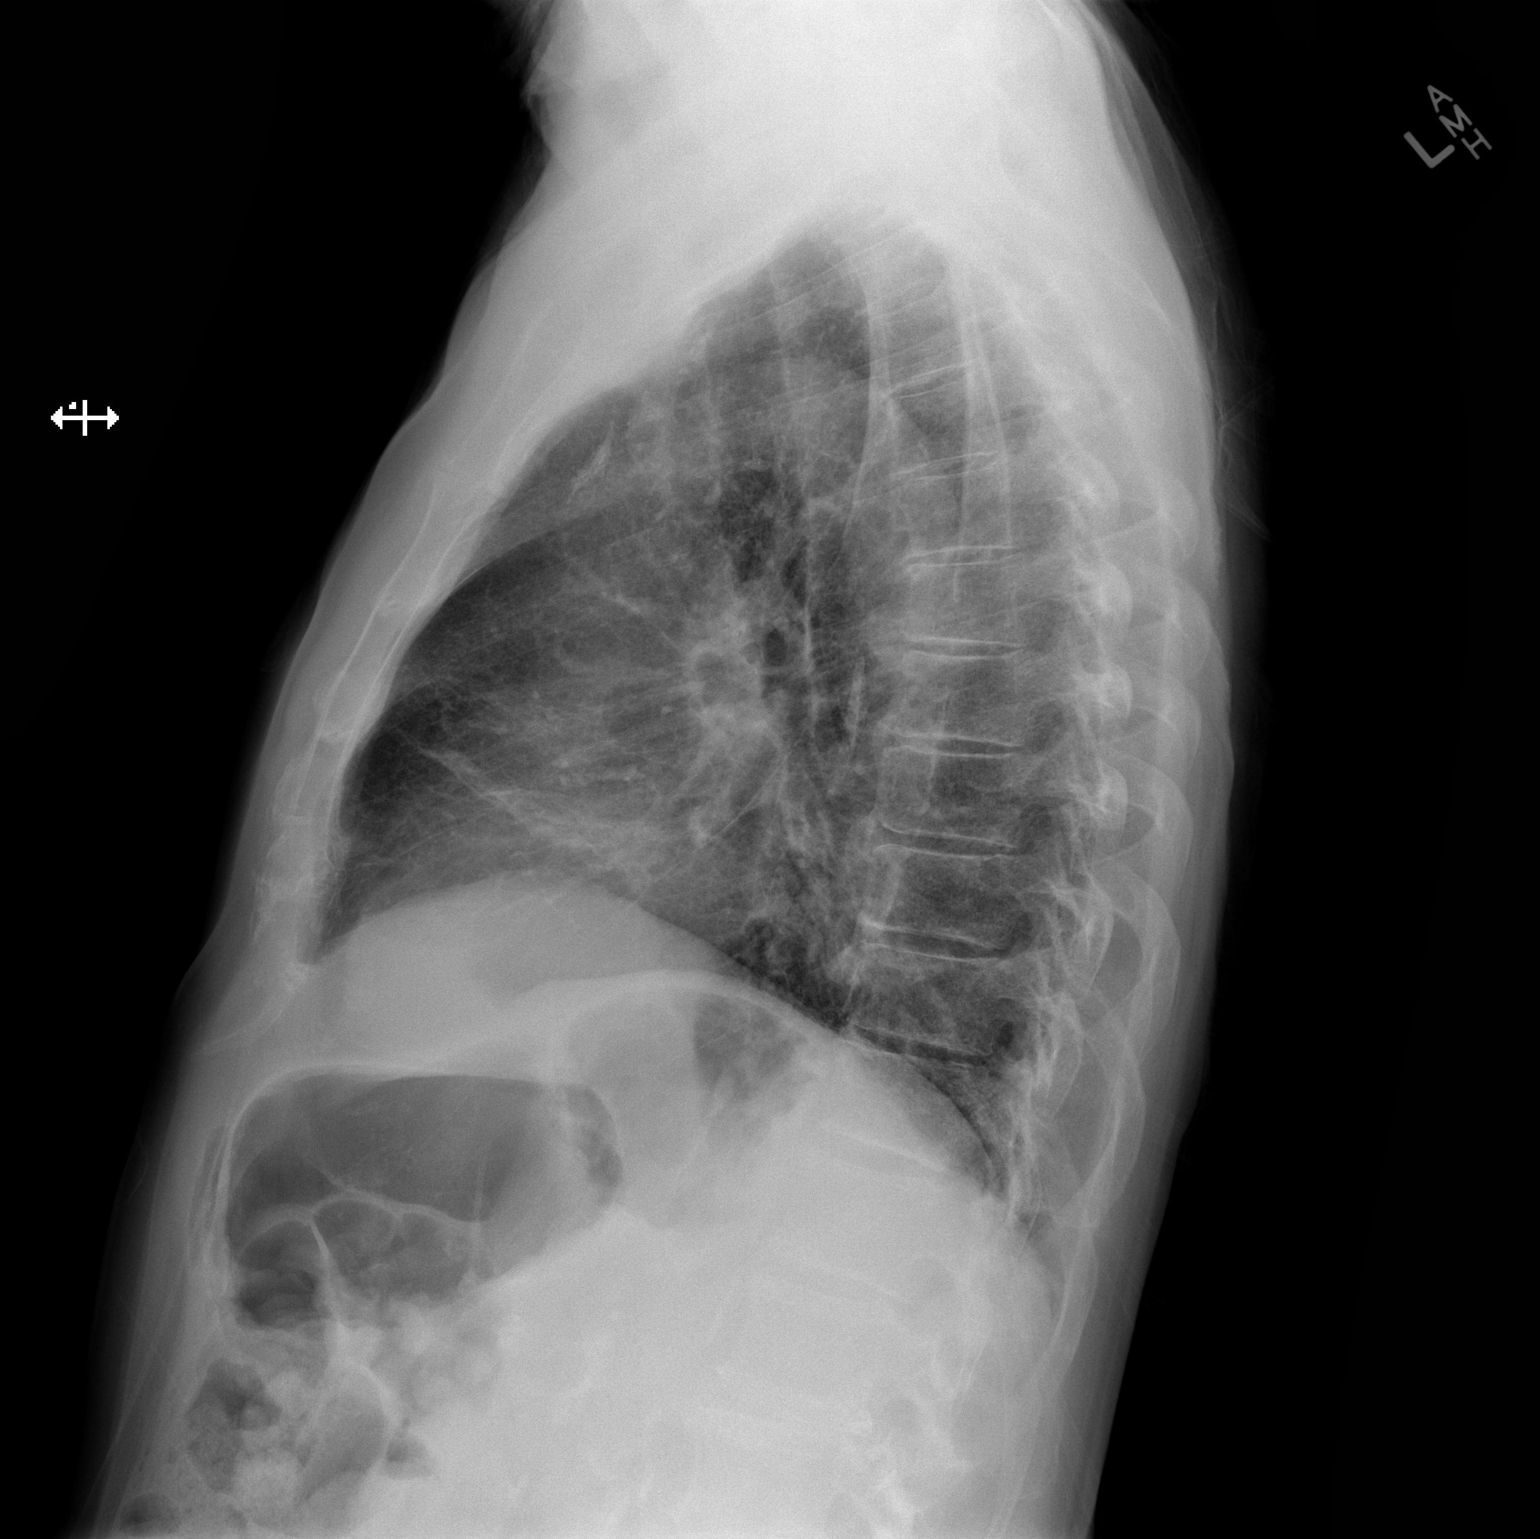

[2 of 2 positions shown; findings below may reference images not displayed]

FINDINGS: Heart size and vascular pattern are normal. Mild uncoiling of the
aorta with mild calcification of the aortic arch and descending
aorta. Mild diffuse interstitial prominence with no evidence of mass
or consolidation. No pleural effusion.
IMPRESSION: Mild probably chronic interstitial lung disease.

## 2014-07-20 IMAGING — CR DG CHEST 1V PORT
1 series · 1 of 1 positions shown · non-contrast
Comparison: 11/19/2013.

CLINICAL DATA: Central line placement.

EXAM:
PORTABLE CHEST - 1 VIEW

[AP]
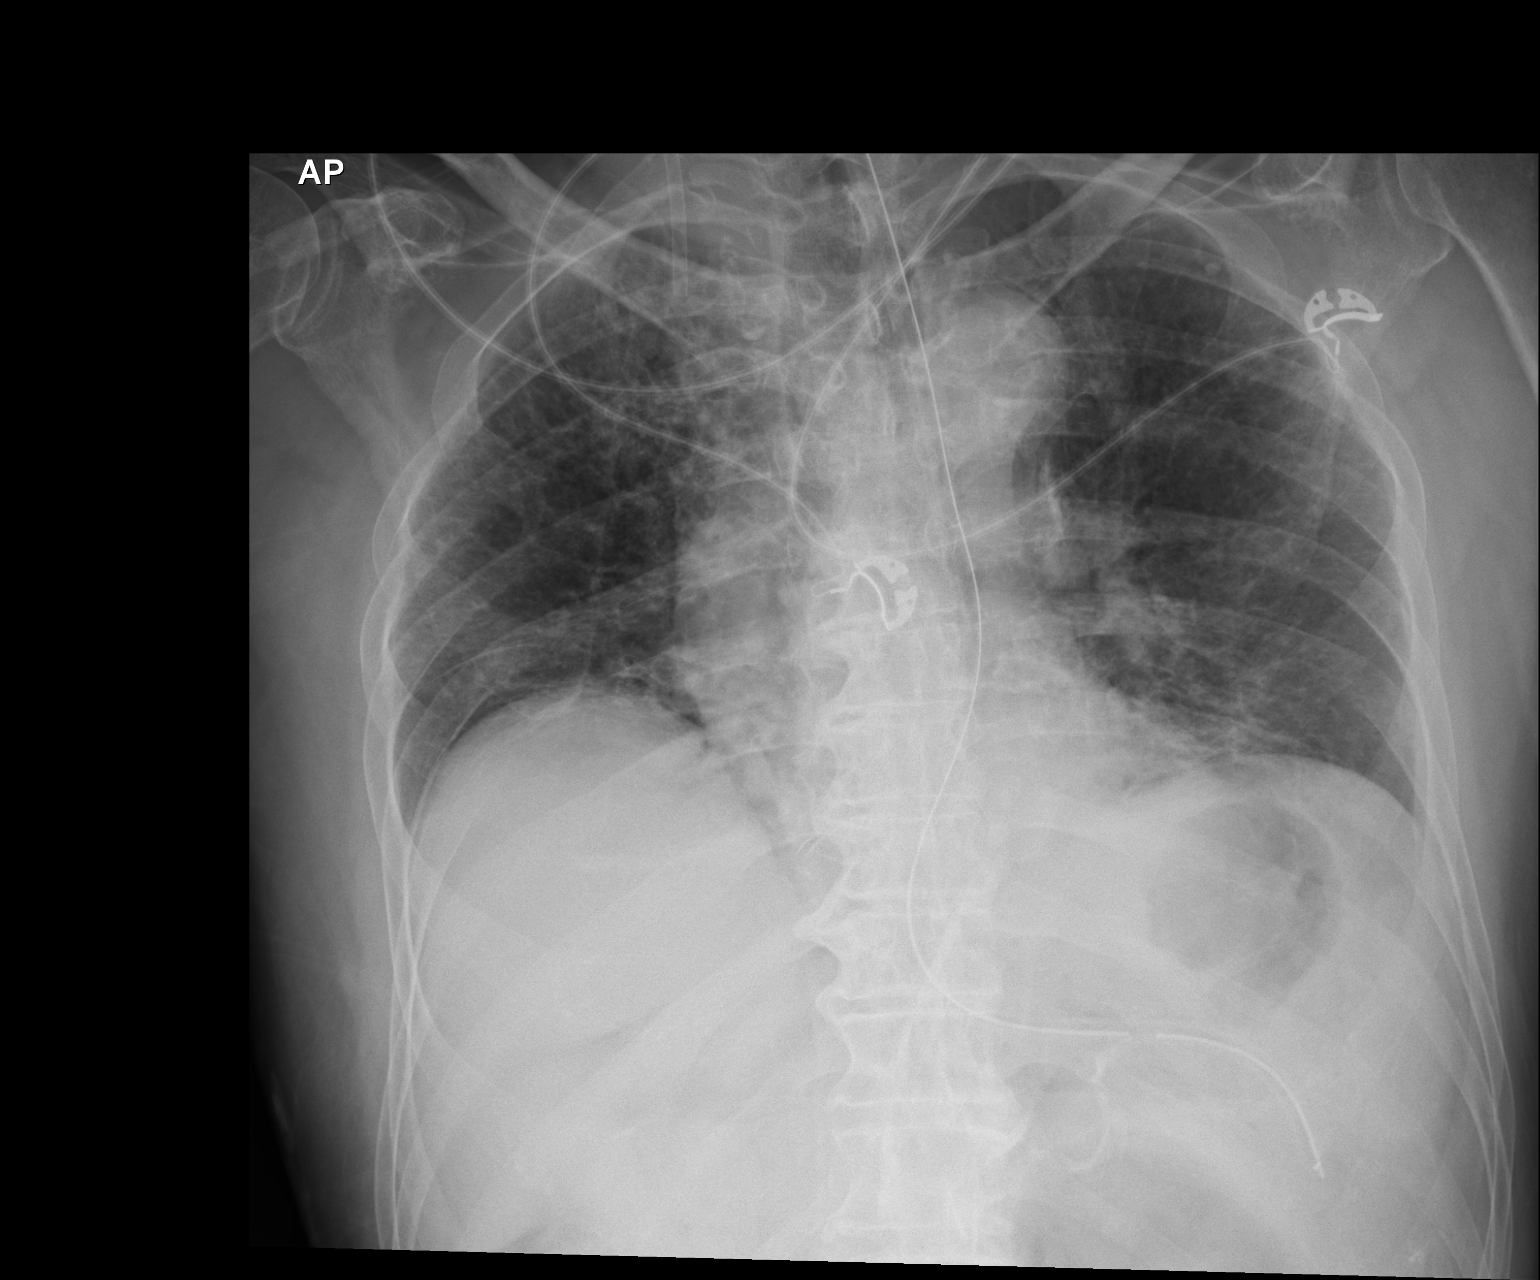

[1 of 1 positions shown; findings below may reference images not displayed]

FINDINGS: There is a right IJ central venous Cordis in the right internal
jugular vein near the junction with the subclavian vein. No
pneumothorax. The NG tube is in the stomach. There are significant
chronic lung changes with probable emphysema and pulmonary scarring/
fibrosis. Stable eventration of the right hemidiaphragm. Low lung
volumes with vascular crowding and atelectasis.
IMPRESSION: The right IJ Cordis is in the region of the internal jugulars
subclavian junction.

The NG tube is in the stomach.

Chronic lung changes with superimposed atelectasis due to low lung
volumes.

## 2014-07-21 IMAGING — CR DG CHEST 1V PORT
1 series · 1 of 1 positions shown · non-contrast
Comparison: 11/22/2014

CLINICAL DATA: Postop.

EXAM:
PORTABLE CHEST - 1 VIEW

[AP]
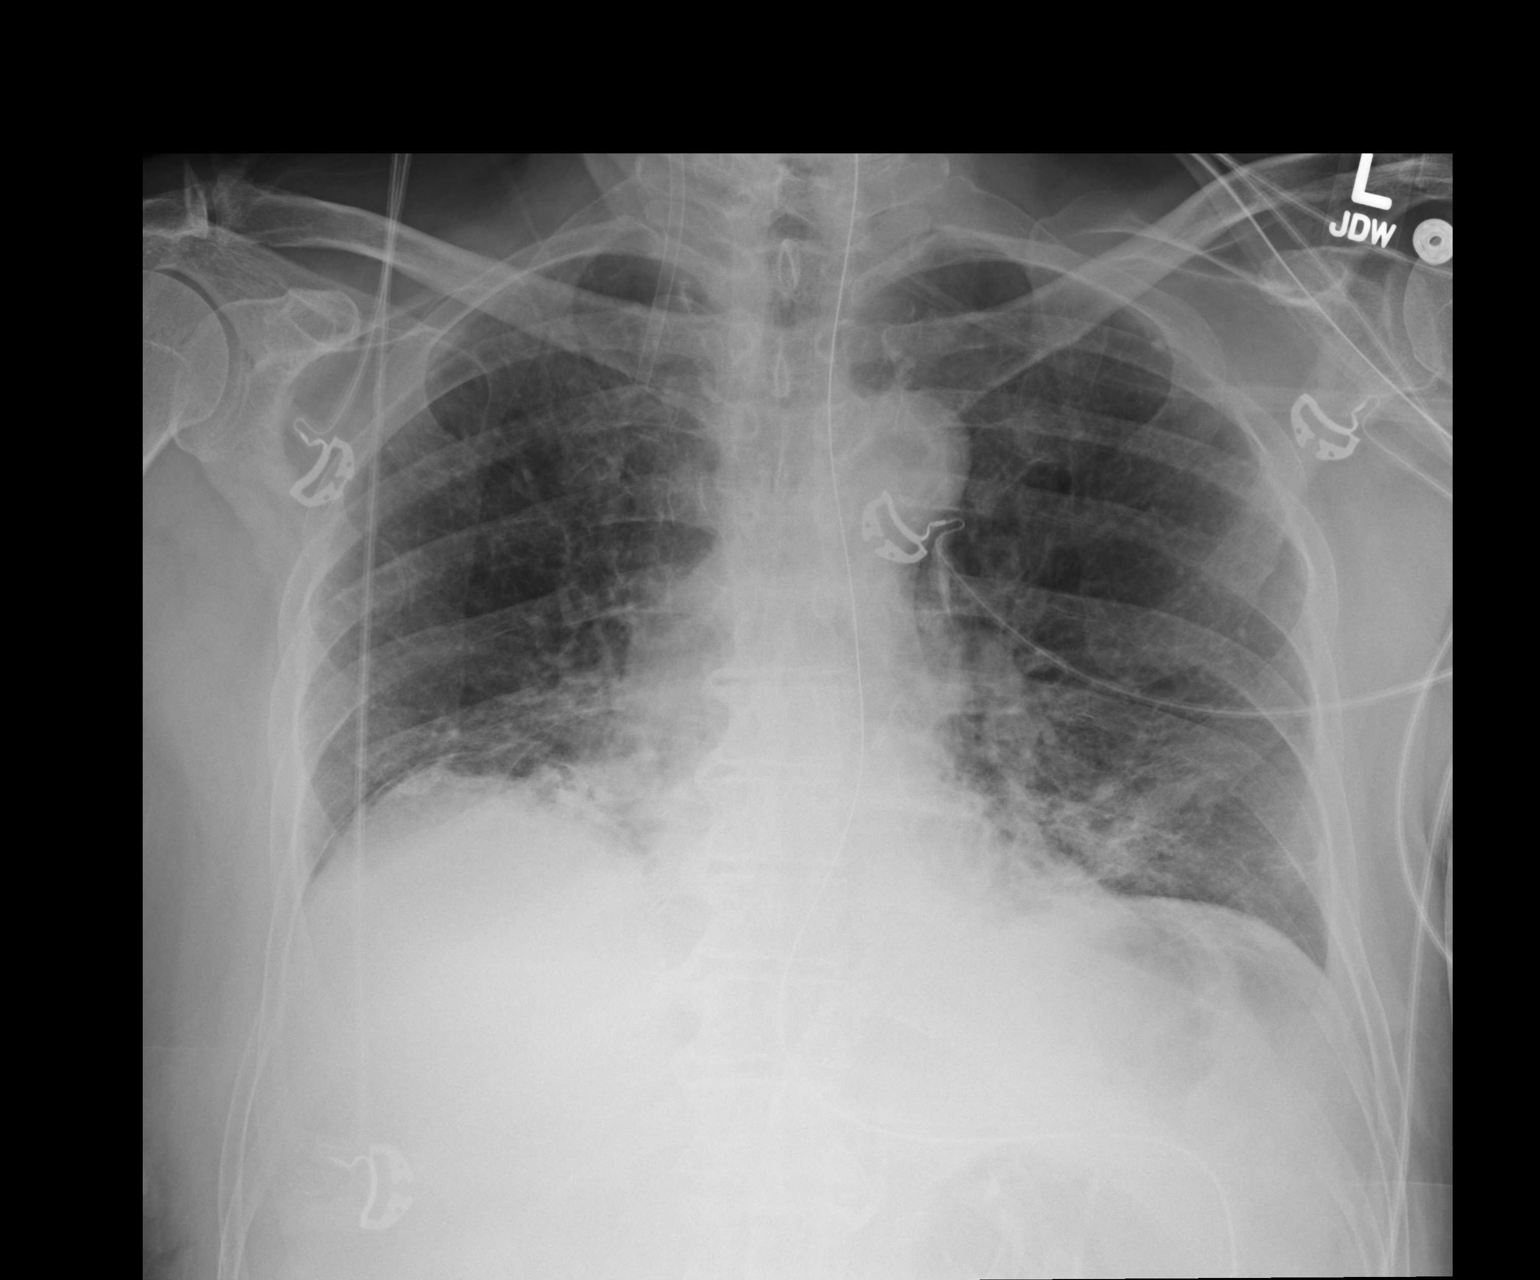

[1 of 1 positions shown; findings below may reference images not displayed]

FINDINGS: Right jugular central line and nasogastric tube are still present.
Nasogastric tube is in the stomach region. Patchy densities at lung
bases are most compatible with atelectasis. Lucency near the right
hemidiaphragm could represent free air and consistent with recent
intra-abdominal surgery. No evidence for a pneumothorax. Heart size
is within normal limits.
IMPRESSION: Patchy basilar densities are most compatible with atelectasis.

Evidence for free intraperitoneal air due to recent intra-abdominal
surgery.

## 2014-08-06 ENCOUNTER — Encounter: Payer: Self-pay | Admitting: Cardiology

## 2014-08-06 ENCOUNTER — Ambulatory Visit (INDEPENDENT_AMBULATORY_CARE_PROVIDER_SITE_OTHER): Payer: Medicare Other | Admitting: Cardiology

## 2014-08-06 VITALS — BP 111/68 | HR 96 | Ht 65.0 in | Wt 128.0 lb

## 2014-08-06 DIAGNOSIS — I2510 Atherosclerotic heart disease of native coronary artery without angina pectoris: Secondary | ICD-10-CM

## 2014-08-06 DIAGNOSIS — I639 Cerebral infarction, unspecified: Secondary | ICD-10-CM

## 2014-08-06 DIAGNOSIS — I6350 Cerebral infarction due to unspecified occlusion or stenosis of unspecified cerebral artery: Secondary | ICD-10-CM

## 2014-08-06 DIAGNOSIS — I739 Peripheral vascular disease, unspecified: Secondary | ICD-10-CM

## 2014-08-06 DIAGNOSIS — I10 Essential (primary) hypertension: Secondary | ICD-10-CM

## 2014-08-06 MED ORDER — CILOSTAZOL 100 MG PO TABS: 100.0000 mg | ORAL_TABLET | Freq: Two times a day (BID) | ORAL | Status: DC

## 2014-08-06 MED ORDER — ASPIRIN 162 MG PO TBEC: 162.0000 mg | DELAYED_RELEASE_TABLET | Freq: Every day | ORAL | Status: DC

## 2014-08-06 NOTE — Patient Instructions (Addendum)
Start ASA 162 mg a day. May take 2 - 81 mg tablets a day. Stop Xarelto. Continue all other medications as listed.  Follow up in 1 year with Dr. Percival Spanish in [place].  You will receive a letter in the mail 2 months before you are due.  Please call us when you receive this letter to schedule your follow up appointment.

## 2014-08-06 NOTE — Progress Notes (Signed)
HPI The patient presents for evaluation of chest pain and vascular disease.  At the last visit he was having these symptoms I sent him for a catheterization which demonstrated nonobstructive coronary disease. Since I last saw and he's had aortobifem bypass and superior mesenteric artery reimplantation.  He did have a DVT postoperatively. He was placed on Xarelto. He doesn't like this medication causing bruising he says that coldness in his feet which was her complaint has improved. He's not describing any new chest pressure, neck or arm discomfort. He does have dyspnea with exertion. He's been fatigued. This has not changed. He denies any resting shortness of breath, PND or orthopnea. He's had no weight gain or edema. He continues to smoke cigarettes.   Allergies  Allergen Reactions  . Morphine And Related Other (See Comments)    "goes crazy"    Current Outpatient Prescriptions  Medication Sig Dispense Refill  . atorvastatin (LIPITOR) 40 MG tablet TAKE ONE TABLET BY MOUTH ONCE DAILY  30 tablet  5  . cilostazol (PLETAL) 100 MG tablet Take 1 tablet (100 mg total) by mouth 2 (two) times daily.  30 tablet  5  . citalopram (CELEXA) 20 MG tablet Take 1 tablet (20 mg total) by mouth daily.  30 tablet  11  . diclofenac (VOLTAREN) 75 MG EC tablet Take 75 mg by mouth 3 (three) times daily.       . hyoscyamine (LEVSIN SL) 0.125 MG SL tablet DISSOLVE ONE TABLET UNDER THE TONGUE EVERY 6 HOURS AS NEEDED  30 tablet  5  . lisinopril (PRINIVIL,ZESTRIL) 20 MG tablet TAKE ONE TABLET BY MOUTH ONCE DAILY  30 tablet  5  . metFORMIN (GLUCOPHAGE) 500 MG tablet Take 1 tablet (500 mg total) by mouth 2 (two) times daily with a meal.  60 tablet  5  . mirtazapine (REMERON) 30 MG tablet Take 1 tablet (30 mg total) by mouth at bedtime.  30 tablet  5  . omeprazole (PRILOSEC) 20 MG capsule TAKE ONE CAPSULE BY MOUTH ONCE DAILY  30 capsule  5  . Oxycodone HCl 10 MG TABS Take 1 tablet (10 mg total) by mouth every 4 (four)  hours as needed (pain).  90 tablet  0  . ranitidine (ZANTAC) 150 MG tablet Take 1 tablet (150 mg total) by mouth 2 (two) times daily.  60 tablet  5  . rivaroxaban (XARELTO) 20 MG TABS tablet Take 1 tablet (20 mg total) by mouth daily.  30 tablet  3  . zolpidem (AMBIEN) 5 MG tablet Take 5 mg by mouth at bedtime.        No current facility-administered medications for this visit.    Past Medical History  Diagnosis Date  . Hyperlipidemia   . GERD (gastroesophageal reflux disease)   . Hypertension   . COPD (chronic obstructive pulmonary disease)   . Insomnia   . Gout   . DVT (deep venous thrombosis)   . Peripheral vascular disease   . Diabetes mellitus without complication   . Headache(784.0)     migranes- not in a few years  . Coronary artery disease   . AAA (abdominal aortic aneurysm)   . Colon polyps     adenomatous  . Cataract   . Depression   . Cecal ulcer   . Diverticulosis   . Hemorrhoids     Past Surgical History  Procedure Laterality Date  . Hemicolectomy    . Aorta - bilateral femoral artery bypass graft N/A 11/22/2013  Procedure: AORTA BIFEMORAL BYPASS GRAFT; SUPERIOR MESENTERIC ARTERY BYPASS, INFERIOR MESENTERIC BYPASS;  Surgeon: Serafina Mitchell, MD;  Location: [facility];  Service: Vascular;  Laterality: N/A;  . Colonoscopy  10/25/2007, 04/28/2009  . Esophagogastroduodenoscopy  01/03/2003    Family History  Problem Relation Age of Onset  . Heart disease Father 19    pacemaker  . Diabetes Mother     renal failure  . Heart disease Mother   . CAD Brother     died at 42  . COPD Brother   . Heart disease Brother   . Hyperlipidemia Daughter   . Diabetes Son   . Heart disease Son     before age 42  . Heart attack Son   . Lung cancer Brother     History   Social History  . Marital Status: Divorced    Spouse Name: N/A    Number of Children: 3  . Years of Education: N/A   Occupational History  . retired    Social History Main Topics  . Smoking status:  Current Every Day Smoker -- 1.50 packs/day for 55 years    Types: Cigarettes  . Smokeless tobacco: Never Used  . Alcohol Use: No  . Drug Use: No  . Sexual Activity: Not on file   Other Topics Concern  . Not on file   Social History Narrative   Lives alone.     ROS:  Cough, constipation.  Otherwise as stated in the HPI and negative for all other systems.    PHYSICAL EXAM BP 111/68  Pulse 96  Ht 5\' 5"  (1.651 m)  Wt 128 lb (58.06 kg)  BMI 21.30 kg/m2 GENERAL:  Well appearing HEENT:  Pupils equal round and reactive, fundi not visualized, oral mucosa unremarkable NECK:  No jugular venous distention, waveform within normal limits, carotid upstroke brisk and symmetric, bilateral carotid bruits, no thyromegaly LYMPHATICS:  No cervical, inguinal adenopathy LUNGS:  Clear to auscultation bilaterally BACK:  No CVA tenderness CHEST:  Unremarkable HEART:  PMI not displaced or sustained,S1 and S2 within normal limits, no S3, no S4, no clicks, no rubs, no murmurs ABD:  Flat, positive bowel sounds normal in frequency in pitch, positive midline and bilateral bruits, no rebound, no guarding, no midline pulsatile mass, no hepatomegaly, no splenomegaly EXT:  2 plus pulses upper, absent DP/PT bilateral, no edema, no cyanosis no clubbing, right femoral access sitewithout erythema or bruising SKIN:  No rashes no nodules, bruising NEURO:  Cranial nerves II through XII grossly intact, motor grossly intact throughout PSYCH:  Cognitively intact, oriented to person place and time   ASSESSMENT AND PLAN  CAD:  He has nonobstructive disease and should have continued risk reduction.   PVD:  Continue risk reduction and follow up with Dr. Charleston Ropes  BRUITS:   He had moderate disease on carotid Dopplers this year.   TOBACCO:  He has no desire at this point to quit smoking and we have talked about this.  HTN:  The blood pressure is at target. No change in medications is indicated. We will continue with  therapeutic lifestyle changes (TLC).  DVT:  This was post op.  He can now come off of Xarelto which he does not want to take.  He will resume ASA.  Of note I clarified he is not taking Voltaren.

## 2014-08-14 ENCOUNTER — Ambulatory Visit: Payer: Self-pay | Admitting: Cardiology

## 2014-08-18 ENCOUNTER — Other Ambulatory Visit: Payer: Self-pay | Admitting: Family Medicine

## 2014-08-18 NOTE — Telephone Encounter (Signed)
Last filled 07/07/14, saw you a couple of times. Rx will print

## 2014-08-19 ENCOUNTER — Telehealth: Payer: Self-pay | Admitting: Nurse Practitioner

## 2014-08-19 NOTE — Telephone Encounter (Signed)
NTBS for pain meds- has not gotten filled in awhile

## 2014-08-19 NOTE — Telephone Encounter (Signed)
He was just seen on 07/07/14 and his pain med was also filled this day. Please see if you can get someone here to fill

## 2014-08-20 MED ORDER — OXYCODONE HCL 10 MG PO TABS: 10.0000 mg | ORAL_TABLET | ORAL | Status: DC | PRN

## 2014-08-20 NOTE — Telephone Encounter (Signed)
Perry Kennedy can you please fill patient hydrocodone- see my last note if have questions. Thanks

## 2014-08-20 NOTE — Telephone Encounter (Signed)
Patient aware aware to pick up

## 2014-09-17 ENCOUNTER — Telehealth: Payer: Self-pay | Admitting: Cardiology

## 2014-09-17 ENCOUNTER — Telehealth: Payer: Self-pay | Admitting: Cardiovascular Disease

## 2014-09-17 NOTE — Telephone Encounter (Signed)
Wanted to know why he was call to schedule Echo.   Explain to wife that it was a recall and it was time to have test repeated.  At this time they don't wish to do test.  Will call if he have any problems.

## 2014-09-17 NOTE — Telephone Encounter (Signed)
Called patient to setup arterial doppler from recall.   Wife called wanted to know why the test was ordered, I explain that is was from the recall.  She stated he as fine and he didn't want to do at this time.

## 2014-09-18 NOTE — Telephone Encounter (Signed)
Reviewed chart and pt is followed by Dr Trula Slade at [facility].

## 2014-10-30 ENCOUNTER — Encounter (HOSPITAL_COMMUNITY): Payer: Self-pay | Admitting: Cardiovascular Disease

## 2015-01-13 ENCOUNTER — Other Ambulatory Visit: Payer: Self-pay | Admitting: Nurse Practitioner

## 2015-02-10 ENCOUNTER — Other Ambulatory Visit: Payer: Self-pay | Admitting: Nurse Practitioner

## 2015-02-10 ENCOUNTER — Other Ambulatory Visit: Payer: Self-pay | Admitting: Family Medicine

## 2015-02-25 ENCOUNTER — Other Ambulatory Visit: Payer: Self-pay | Admitting: Nurse Practitioner

## 2015-03-09 ENCOUNTER — Other Ambulatory Visit: Payer: Self-pay | Admitting: Nurse Practitioner

## 2015-03-12 ENCOUNTER — Other Ambulatory Visit: Payer: Self-pay | Admitting: Nurse Practitioner

## 2015-03-12 NOTE — Telephone Encounter (Signed)
Last seen and last A1C 07/07/14 MMM

## 2015-03-31 ENCOUNTER — Other Ambulatory Visit: Payer: Self-pay | Admitting: Nurse Practitioner

## 2015-04-01 ENCOUNTER — Telehealth: Payer: Self-pay | Admitting: Nurse Practitioner

## 2015-04-01 NOTE — Telephone Encounter (Signed)
Called patient- advised he needs follow up appointment with MMM.  He said he will call next week to schedule.  1 month refill on atorvastatin sent.

## 2015-04-01 NOTE — Telephone Encounter (Signed)
appt given per patients sons request

## 2015-04-02 ENCOUNTER — Encounter: Payer: Self-pay | Admitting: Nurse Practitioner

## 2015-04-02 ENCOUNTER — Ambulatory Visit (INDEPENDENT_AMBULATORY_CARE_PROVIDER_SITE_OTHER): Payer: Medicare Other | Admitting: Nurse Practitioner

## 2015-04-02 VITALS — BP 151/80 | HR 100 | Temp 98.1°F | Ht 65.0 in | Wt 142.0 lb

## 2015-04-02 DIAGNOSIS — I6930 Unspecified sequelae of cerebral infarction: Secondary | ICD-10-CM | POA: Diagnosis not present

## 2015-04-02 DIAGNOSIS — R531 Weakness: Secondary | ICD-10-CM

## 2015-04-02 NOTE — Patient Instructions (Signed)
Stroke Prevention Some medical conditions and behaviors are associated with an increased chance of having a stroke. You may prevent a stroke by making healthy choices and managing medical conditions. HOW CAN I REDUCE MY RISK OF HAVING A STROKE?   Stay physically active. Get at least 30 minutes of activity on most or all days.  Do not smoke. It may also be helpful to avoid exposure to secondhand smoke.  Limit alcohol use. Moderate alcohol use is considered to be:  No more than 2 drinks per day for men.  No more than 1 drink per day for nonpregnant women.  Eat healthy foods. This involves:  Eating 5 or more servings of fruits and vegetables a day.  Making dietary changes that address high blood pressure (hypertension), high cholesterol, diabetes, or obesity.  Manage your cholesterol levels.  Making food choices that are high in fiber and low in saturated fat, trans fat, and cholesterol may control cholesterol levels.  Take any prescribed medicines to control cholesterol as directed by your health care provider.  Manage your diabetes.  Controlling your carbohydrate and sugar intake is recommended to manage diabetes.  Take any prescribed medicines to control diabetes as directed by your health care provider.  Control your hypertension.  Making food choices that are low in salt (sodium), saturated fat, trans fat, and cholesterol is recommended to manage hypertension.  Take any prescribed medicines to control hypertension as directed by your health care provider.  Maintain a healthy weight.  Reducing calorie intake and making food choices that are low in sodium, saturated fat, trans fat, and cholesterol are recommended to manage weight.  Stop drug abuse.  Avoid taking birth control pills.  Talk to your health care provider about the risks of taking birth control pills if you are over 35 years old, smoke, get migraines, or have ever had a blood clot.  Get evaluated for sleep  disorders (sleep apnea).  Talk to your health care provider about getting a sleep evaluation if you snore a lot or have excessive sleepiness.  Take medicines only as directed by your health care provider.  For some people, aspirin or blood thinners (anticoagulants) are helpful in reducing the risk of forming abnormal blood clots that can lead to stroke. If you have the irregular heart rhythm of atrial fibrillation, you should be on a blood thinner unless there is a good reason you cannot take them.  Understand all your medicine instructions.  Make sure that other conditions (such as anemia or atherosclerosis) are addressed. SEEK IMMEDIATE MEDICAL CARE IF:   You have sudden weakness or numbness of the face, arm, or leg, especially on one side of the body.  Your face or eyelid droops to one side.  You have sudden confusion.  You have trouble speaking (aphasia) or understanding.  You have sudden trouble seeing in one or both eyes.  You have sudden trouble walking.  You have dizziness.  You have a loss of balance or coordination.  You have a sudden, severe headache with no known cause.  You have new chest pain or an irregular heartbeat. Any of these symptoms may represent a serious problem that is an emergency. Do not wait to see if the symptoms will go away. Get medical help at once. Call your local emergency services (911 in [place] Do not drive yourself to the hospital. Document Released: 12/15/2004 Document Revised: 03/24/2014 Document Reviewed: 05/10/2013 Exit[employer] Patient Information 2015 [employer]. This information is not intended to replace advice given   to you by your health care provider. Make sure you discuss any questions you have with your health care provider.  

## 2015-04-02 NOTE — Progress Notes (Signed)
   Subjective:    Patient ID: Perry Kennedy, male    DOB: 1941/05/17, 74 y.o.   MRN: [id number]  HPI Last Thursday patient said that he lost control of left side of face and felt weak. Did not call 911- layed down and got better. Had another episode last Saturday that lasted several minutes. Denies any trouble with speech. Was in hospital over a year ago when he has a stroke after having aneurysm repair. Has had lfet facial drooping since that stroke but worsened during recent events.    Review of Systems  Constitutional: Negative.   HENT: Negative.   Respiratory: Negative.   Cardiovascular: Negative.   Genitourinary: Negative.   Neurological: Positive for dizziness and light-headedness.  Psychiatric/Behavioral: Negative.   All other systems reviewed and are negative.      Objective:   Physical Exam  Constitutional: He is oriented to person, place, and time. He appears well-developed and well-nourished. No distress.  Cardiovascular: Normal rate, regular rhythm and normal heart sounds.   Pulmonary/Chest: Effort normal and breath sounds normal.  Neurological: He is alert and oriented to person, place, and time. He has normal reflexes. No cranial nerve deficit.  Slight facila drooping on left - but has been that way since stroke last year.  Skin: Skin is warm and dry.  Psychiatric: He has a normal mood and affect. His behavior is normal. Judgment and thought content normal.    BP 151/80 mmHg  Pulse 100  Temp(Src) 98.1 F (36.7 C) (Oral)  Ht 5\' 5"  (1.651 m)  Wt 142 lb (64.411 kg)  BMI 23.63 kg/m2       Assessment & Plan:   1. History of CVA with residual deficit   2. Weakness    Orders Placed This Encounter  Procedures  . Ambulatory referral to Neurology    Referral Priority:  Routine    Referral Type:  Consultation    Referral Reason:  Specialty Services Required    Requested Specialty:  Neurology    Number of Visits Requested:  1   Keep diary of events- call  911 if does not resolve in 2-3 minutes Continue daily baby asa RTO prn  Mary-Margaret Hassell Done, FNP

## 2015-04-09 ENCOUNTER — Other Ambulatory Visit: Payer: Self-pay | Admitting: Nurse Practitioner

## 2015-04-13 ENCOUNTER — Other Ambulatory Visit: Payer: Self-pay | Admitting: Nurse Practitioner

## 2015-04-14 NOTE — Telephone Encounter (Signed)
Pt just here but, last A1C wa 06/2014

## 2015-04-14 NOTE — Telephone Encounter (Signed)
Patient NTBS for follow up and lab work  

## 2015-04-15 ENCOUNTER — Ambulatory Visit: Payer: Self-pay | Admitting: Nurse Practitioner

## 2015-04-29 ENCOUNTER — Other Ambulatory Visit: Payer: Self-pay | Admitting: Nurse Practitioner

## 2015-05-13 ENCOUNTER — Other Ambulatory Visit: Payer: Self-pay | Admitting: Nurse Practitioner

## 2015-05-14 ENCOUNTER — Other Ambulatory Visit: Payer: Self-pay | Admitting: Nurse Practitioner

## 2015-05-14 NOTE — Telephone Encounter (Signed)
Last seen 04/02/15 MMM Not on EPIC list

## 2015-05-20 ENCOUNTER — Ambulatory Visit (INDEPENDENT_AMBULATORY_CARE_PROVIDER_SITE_OTHER): Payer: Medicare Other | Admitting: Neurology

## 2015-05-20 ENCOUNTER — Encounter: Payer: Self-pay | Admitting: Neurology

## 2015-05-20 VITALS — BP 162/88 | HR 98 | Resp 22 | Ht 65.0 in | Wt 139.4 lb

## 2015-05-20 DIAGNOSIS — I739 Peripheral vascular disease, unspecified: Secondary | ICD-10-CM | POA: Diagnosis not present

## 2015-05-20 DIAGNOSIS — Z86718 Personal history of other venous thrombosis and embolism: Secondary | ICD-10-CM

## 2015-05-20 DIAGNOSIS — Z720 Tobacco use: Secondary | ICD-10-CM | POA: Diagnosis not present

## 2015-05-20 DIAGNOSIS — G458 Other transient cerebral ischemic attacks and related syndromes: Secondary | ICD-10-CM

## 2015-05-20 DIAGNOSIS — Q2112 Patent foramen ovale: Secondary | ICD-10-CM

## 2015-05-20 DIAGNOSIS — Q211 Atrial septal defect: Secondary | ICD-10-CM

## 2015-05-20 NOTE — Patient Instructions (Signed)
I would recommend switching from ASPIRIN to PLAVIX.  Please discuss with Mary-Margaret. We will check bilateral lower extremity venous dopplers and carotid dopplers We discussed importance of quitting smoking Follow up in 3 months.

## 2015-05-20 NOTE — Progress Notes (Signed)
NEUROLOGY CONSULTATION NOTE  Perry Kennedy MRN: [id number] DOB: 1941-07-23  Referring provider: Chevis Pretty, NP Primary care provider: Chevis Pretty, NP  Reason for consult:  TIA or stroke  HISTORY OF PRESENT ILLNESS: Perry Kennedy is a 74 year old right-handed man with hyperlipidemia, hypertension, COPD, diabetes, CAD, peripheral vascular disease and tobacco abuse and AAA status post repair who presents for stroke.  Records, labs and prior MRIs of January 2015 reviewed.  He is accompanied by his son who provides some history.  In early May, he had an episode where he developed probable left-sided facial weakness.  He described that his left eye was twitching and he couldn't move the side of his mouth.  There was also some drool.  It lasted for a couple of minutes and resolved.  There was no associated headache, dizziness, facial numbness or involvement of the extremities.  He had two other episodes over the course of the month.  He has peripheral vascular disease.  He had a stroke in January 2015 while in the hospital following for aortobifemoral bypass graft, extensive right common femoral and profunda femoral endarterectomy, aorta to superior mesenteric artery bypass graft and reimplantation of the inferior mesenteric artery.  On the day after the surgery, he had an acute right frontal lobe infarct with small amount of petechial hemorrhage, presenting as slurred speech and left facial droop.  Carotid doppler showed 1-39% ICA stenosis.  He was found to have lower extremity DVT.  TEE was positive for PFO.  He was started on Xarelto for one year.  While on Xarelto, ASA 325mg  was reduced to 162mg  daily.  The Xarelto was discontinued a few months ago.  He continues on ASA 162mg  daily.  Last Hgb A1c from January 2015 was 6.6.  Lipid panel showed LDL of 45.  PAST MEDICAL HISTORY: Past Medical History  Diagnosis Date  . Hyperlipidemia   . GERD (gastroesophageal reflux  disease)   . Hypertension   . COPD (chronic obstructive pulmonary disease)   . Insomnia   . Gout   . DVT (deep venous thrombosis)   . Peripheral vascular disease   . Diabetes mellitus without complication   . Headache(784.0)     migranes- not in a few years  . Coronary artery disease     December 2014 left main 50% stenosis, LAD 40% stenosis, first diagonal 70% stenosis, ramus intermediate 60% stenosis, right coronary artery 25% stenosis.  Marland Kitchen AAA (abdominal aortic aneurysm)   . Colon polyps     adenomatous  . Cataract   . Depression   . Cecal ulcer   . Diverticulosis   . Hemorrhoids     PAST SURGICAL HISTORY: Past Surgical History  Procedure Laterality Date  . Hemicolectomy    . Aorta - bilateral femoral artery bypass graft N/A 11/22/2013    Procedure: AORTA BIFEMORAL BYPASS GRAFT; SUPERIOR MESENTERIC ARTERY BYPASS, INFERIOR MESENTERIC BYPASS;  Surgeon: Serafina Mitchell, MD;  Location: [place];  Service: Vascular;  Laterality: N/A;  . Colonoscopy  10/25/2007, 04/28/2009  . Esophagogastroduodenoscopy  01/03/2003  . Abdominal aortagram N/A 10/16/2013    Procedure: ABDOMINAL Maxcine Ham;  Surgeon: Wellington Hampshire, MD;  Location: [facility] CATH LAB;  Service: Cardiovascular;  Laterality: N/A;  . Left heart catheterization with coronary angiogram N/A 10/29/2013    Procedure: LEFT HEART CATHETERIZATION WITH CORONARY ANGIOGRAM;  Surgeon: Minus Breeding, MD;  Location[facility];  Service: Cardiovascular;  Laterality: N/A;  . Visceral angiogram N/A 11/22/2013    Procedure:  VISCERAL ANGIOGRAM;  Surgeon: Elam Dutch, MD;  Location: [facility] CATH LAB;  Service: Cardiovascular;  Laterality: N/A;    MEDICATIONS: Current Outpatient Prescriptions on File Prior to Visit  Medication Sig Dispense Refill  . aspirin 162 MG EC tablet Take 1 tablet (162 mg total) by mouth daily.    Marland Kitchen atorvastatin (LIPITOR) 40 MG tablet TAKE ONE TABLET BY MOUTH ONCE DAILY NEEDS  TO  BE  SEEN  FOR  ADDITIONAL  REFILLS 30 tablet 4  .  cilostazol (PLETAL) 100 MG tablet Take 1 tablet (100 mg total) by mouth 2 (two) times daily. 180 tablet 3  . citalopram (CELEXA) 20 MG tablet Take 1 tablet (20 mg total) by mouth daily. 30 tablet 11  . indomethacin (INDOCIN) 50 MG capsule TAKE 1 CAPSULE EVERY 8 HOURS AS NEEDED FOR PAIN 30 capsule 0  . lisinopril (PRINIVIL,ZESTRIL) 20 MG tablet TAKE ONE TABLET BY MOUTH ONCE DAILY 30 tablet 5  . metFORMIN (GLUCOPHAGE) 500 MG tablet TAKE ONE TABLET BY MOUTH TWICE DAILY WITH MEALS NEEDS  APPOINTMENT 60 tablet 1  . mirtazapine (REMERON) 30 MG tablet Take 1 tablet (30 mg total) by mouth at bedtime. 30 tablet 5  . omeprazole (PRILOSEC) 20 MG capsule TAKE ONE CAPSULE BY MOUTH ONCE DAILY 30 capsule 5  . omeprazole (PRILOSEC) 20 MG capsule TAKE ONE CAPSULE BY MOUTH ONCE DAILY NEEDS  TO  BE  SEEN 30 capsule 4  . Oxycodone HCl 10 MG TABS Take 1 tablet (10 mg total) by mouth every 4 (four) hours as needed (pain). 90 tablet 0  . ranitidine (ZANTAC) 150 MG tablet TAKE ONE TABLET BY MOUTH TWICE DAILY 60 tablet 4  . zolpidem (AMBIEN) 5 MG tablet Take 5 mg by mouth at bedtime.      No current facility-administered medications on file prior to visit.    ALLERGIES: Allergies  Allergen Reactions  . Morphine And Related Other (See Comments)    "goes crazy"    FAMILY HISTORY: Family History  Problem Relation Age of Onset  . Heart disease Father 48    pacemaker  . Diabetes Mother     renal failure  . Heart disease Mother   . CAD Brother     died at 74  . COPD Brother   . Heart disease Brother   . Hyperlipidemia Daughter   . Diabetes Son   . Heart disease Son     before age 50  . Heart attack Son   . Lung cancer Brother     SOCIAL HISTORY: History   Social History  . Marital Status: Divorced    Spouse Name: N/A  . Number of Children: 3  . Years of Education: N/A   Occupational History  . retired    Social History Main Topics  . Smoking status: Current Every Day Smoker -- 1.50 packs/day  for 55 years    Types: Cigarettes  . Smokeless tobacco: Never Used  . Alcohol Use: No  . Drug Use: No  . Sexual Activity: No   Other Topics Concern  . Not on file   Social History Narrative   Lives alone.     REVIEW OF SYSTEMS: Constitutional: No fevers, chills, or sweats, no generalized fatigue, change in appetite Eyes: No visual changes, double vision, eye pain Ear, nose and throat: No hearing loss, ear pain, nasal congestion, sore throat Cardiovascular: No chest pain, palpitations Respiratory:  No shortness of breath at rest or with exertion, wheezes GastrointestinaI: No nausea, vomiting,  diarrhea, abdominal pain, fecal incontinence Genitourinary:  No dysuria, urinary retention or frequency Musculoskeletal:  No neck pain, back pain Integumentary: No rash, pruritus, skin lesions Neurological: as above Psychiatric: No depression, insomnia, anxiety Endocrine: No palpitations, fatigue, diaphoresis, mood swings, change in appetite, change in weight, increased thirst Hematologic/Lymphatic:  No anemia, purpura, petechiae. Allergic/Immunologic: no itchy/runny eyes, nasal congestion, recent allergic reactions, rashes  PHYSICAL EXAM: Filed Vitals:   05/20/15 1104  BP: 162/88  Pulse: 98  Resp: 22   General: No acute distress Head:  Normocephalic/atraumatic Eyes:  fundi unremarkable, without vessel changes, exudates, hemorrhages or papilledema. Neck: supple, no paraspinal tenderness, full range of motion Back: No paraspinal tenderness Heart: regular rate and rhythm Lungs: Clear to auscultation bilaterally. Vascular: No carotid bruits. Neurological Exam: Mental status: alert and oriented to person, place, and time, recent and remote memory intact, fund of knowledge intact, attention and concentration intact, speech fluent and not dysarthric, language intact. Cranial nerves: CN I: not tested CN II: pupils equal, round and reactive to light, visual fields intact, fundi  unremarkable, without vessel changes, exudates, hemorrhages or papilledema. CN III, IV, VI:  full range of motion, no nystagmus, no ptosis CN V: facial sensation intact CN VII: upper and lower face symmetric CN VIII: hearing intact CN IX, X: gag intact, uvula midline CN XI: sternocleidomastoid and trapezius muscles intact CN XII: tongue midline Bulk & Tone: normal, no fasciculations. Motor:  5/5 throughout Sensation:  Reduced pinprick and vibration in feet. Deep Tendon Reflexes:  2+ throughout except absent in ankles.  Toes downgoing. Finger to nose testing:  Intact Heel to shin:  Intact Gait:  Normal station and stride.  Unable to tandem walk. Romberg negative.  IMPRESSION: Probable TIA Tobacco abuse Peripheral vascular disease History of DVT PFO Hypertension   PLAN: 1.  If no contraindication, I would switch ASA to Plavix.  I defer to PCP to make this change. 2.  Given history of DVT with PFO, will get lower extremity venous dopplers 3.  Check Carotid doppler 4.  Continue statin (LDL at goal of less than 70) 5.  Need to optimize blood pressure.  Blood pressure and HR high today.  Need to recheck with PCP. 6.  Discussed smoking cessation 7.  Follow up in 3 months.  Thank you for allowing me to take part in the care of this patient.  Metta Clines, DO  CC:  Mary-Margaret Hassell Done, NP

## 2015-06-15 ENCOUNTER — Other Ambulatory Visit: Payer: Self-pay | Admitting: Nurse Practitioner

## 2015-06-23 ENCOUNTER — Other Ambulatory Visit: Payer: Self-pay | Admitting: Nurse Practitioner

## 2015-07-07 ENCOUNTER — Other Ambulatory Visit: Payer: Self-pay | Admitting: Nurse Practitioner

## 2015-07-17 ENCOUNTER — Encounter: Payer: Self-pay | Admitting: Family

## 2015-07-20 ENCOUNTER — Ambulatory Visit (INDEPENDENT_AMBULATORY_CARE_PROVIDER_SITE_OTHER): Payer: Medicare Other | Admitting: Family

## 2015-07-20 ENCOUNTER — Encounter: Payer: Self-pay | Admitting: Family

## 2015-07-20 ENCOUNTER — Ambulatory Visit (HOSPITAL_COMMUNITY)
Admission: RE | Admit: 2015-07-20 | Discharge: 2015-07-20 | Disposition: A | Discharge status: Home / Self Care | Payer: Medicare Other | Source: Ambulatory Visit | Attending: Family | Admitting: Family

## 2015-07-20 VITALS — BP 154/87 | HR 99 | Temp 98.1°F | Resp 18 | Ht 66.0 in | Wt 144.0 lb

## 2015-07-20 DIAGNOSIS — I739 Peripheral vascular disease, unspecified: Secondary | ICD-10-CM

## 2015-07-20 DIAGNOSIS — I10 Essential (primary) hypertension: Secondary | ICD-10-CM | POA: Insufficient documentation

## 2015-07-20 DIAGNOSIS — R1013 Epigastric pain: Secondary | ICD-10-CM | POA: Diagnosis not present

## 2015-07-20 DIAGNOSIS — Z95828 Presence of other vascular implants and grafts: Secondary | ICD-10-CM | POA: Diagnosis not present

## 2015-07-20 DIAGNOSIS — Z9889 Other specified postprocedural states: Secondary | ICD-10-CM | POA: Diagnosis not present

## 2015-07-20 DIAGNOSIS — Z48812 Encounter for surgical aftercare following surgery on the circulatory system: Secondary | ICD-10-CM

## 2015-07-20 DIAGNOSIS — Z720 Tobacco use: Secondary | ICD-10-CM

## 2015-07-20 DIAGNOSIS — E119 Type 2 diabetes mellitus without complications: Secondary | ICD-10-CM | POA: Diagnosis not present

## 2015-07-20 DIAGNOSIS — F17200 Nicotine dependence, unspecified, uncomplicated: Secondary | ICD-10-CM

## 2015-07-20 NOTE — Progress Notes (Addendum)
[facility] [place] HISTORY AND PHYSICAL -PAD  History of Present Illness Perry Kennedy is a 74 y.o. male patient of Dr. Trula Slade who is status post aortobifemoral bypass graft and aorta to superior mesenteric artery bypass graft with reimplantation of the inferior mesenteric artery. This was performed on 11/22/2013. On postoperative day 2 he suffered a stroke. During his workup he was found to have a patent foramen ovale. Lower extremity Doppler studies revealed a left femoral DVT. This was felt to be the etiology of his stroke. He had mild carotid disease. The stroke left him with a left facial droop. He has recently been evaluated by GI. His left lower quadrant pain has improved. He states that he is actually tolerating oral intake better. He has gained some weight, 17 pounds in the last year. He has had no subsequent stroke or TIA's. Pt denies non healing wounds, denies rest pain. In terms of intermittent claudication, he states "my legs give out sometimes" , he is unable to state how far he walks before this happens.   The patient reports New Medical or Surgical History: saw a gastroenterologist; pt states he is now able to eat, has gained weight.   Pt Diabetic: Yes, son states well controlled Pt smoker: smoker  (about 1 ppd, started about age 17 yrs)  Pt meds include: Statin :Yes ASA: Yes Other anticoagulants/antiplatelets: Pletal  Past Medical History  Diagnosis Date  . Hyperlipidemia   . GERD (gastroesophageal reflux disease)   . Hypertension   . COPD (chronic obstructive pulmonary disease)   . Insomnia   . Gout   . DVT (deep venous thrombosis)   . Peripheral vascular disease   . Diabetes mellitus without complication   . Headache(784.0)     migranes- not in a few years  . Coronary artery disease     December 2014 left main 50% stenosis, LAD 40% stenosis, first diagonal 70% stenosis, ramus intermediate 60% stenosis, right coronary artery 25%  stenosis.  Marland Kitchen AAA (abdominal aortic aneurysm)   . Colon polyps     adenomatous  . Cataract   . Depression   . Cecal ulcer   . Diverticulosis   . Hemorrhoids     Social History Social History  Substance Use Topics  . Smoking status: Current Every Day Smoker -- 1.50 packs/day for 55 years    Types: Cigarettes  . Smokeless tobacco: Never Used  . Alcohol Use: No    Family History Family History  Problem Relation Age of Onset  . Heart disease Father 109    pacemaker  . Diabetes Mother     renal failure  . Heart disease Mother   . CAD Brother     died at 37  . COPD Brother   . Heart disease Brother   . Cancer Brother   . Hyperlipidemia Daughter   . Diabetes Son   . Heart disease Son     before age 20-  PVD    . Heart attack Son   . Hyperlipidemia Son   . Lung cancer Brother   . Heart disease Brother     Past Surgical History  Procedure Laterality Date  . Hemicolectomy    . Aorta - bilateral femoral artery bypass graft N/A 11/22/2013    Procedure: AORTA BIFEMORAL BYPASS GRAFT; SUPERIOR MESENTERIC ARTERY BYPASS, INFERIOR MESENTERIC BYPASS;  Surgeon: Serafina Mitchell, MD;  [facility];  Service: Vascular;  Laterality: N/A;  . Colonoscopy  10/25/2007, 04/28/2009  .  Esophagogastroduodenoscopy  01/03/2003  . Abdominal aortagram N/A 10/16/2013    Procedure: ABDOMINAL Maxcine Ham;  Surgeon: Wellington Hampshire, MD;  Location: [facility] CATH LAB;  Service: Cardiovascular;  Laterality: N/A;  . Left heart catheterization with coronary angiogram N/A 10/29/2013    Procedure: LEFT HEART CATHETERIZATION WITH CORONARY ANGIOGRAM;  Surgeon: Minus Breeding, MD;  Location: [facility] CATH LAB;  Service: Cardiovascular;  Laterality: N/A;  . Visceral angiogram N/A 11/22/2013    Procedure: VISCERAL ANGIOGRAM;  Surgeon: Elam Dutch, MD;  Location: [facility] CATH LAB;  Service: Cardiovascular;  Laterality: N/A;    Allergies  Allergen Reactions  . Morphine And Related Other (See Comments)    "goes crazy"     Current Outpatient Prescriptions  Medication Sig Dispense Refill  . aspirin 162 MG EC tablet Take 1 tablet (162 mg total) by mouth daily.    Marland Kitchen atorvastatin (LIPITOR) 40 MG tablet TAKE ONE TABLET BY MOUTH ONCE DAILY NEEDS  TO  BE  SEEN  FOR  ADDITIONAL  REFILLS 30 tablet 4  . cilostazol (PLETAL) 100 MG tablet Take 1 tablet (100 mg total) by mouth 2 (two) times daily. 180 tablet 3  . citalopram (CELEXA) 20 MG tablet Take 1 tablet (20 mg total) by mouth daily. 30 tablet 11  . lisinopril (PRINIVIL,ZESTRIL) 20 MG tablet TAKE ONE TABLET BY MOUTH ONCE DAILY 30 tablet 3  . metFORMIN (GLUCOPHAGE) 500 MG tablet TAKE ONE TABLET BY MOUTH TWICE DAILY WITH MEALS. NEEDS APPOINTMENT 60 tablet 0  . mirtazapine (REMERON) 30 MG tablet TAKE ONE TABLET BY MOUTH ONCE DAILY AT BEDTIME 30 tablet 0  . omeprazole (PRILOSEC) 20 MG capsule TAKE ONE CAPSULE BY MOUTH ONCE DAILY 30 capsule 5  . ranitidine (ZANTAC) 150 MG tablet TAKE ONE TABLET BY MOUTH TWICE DAILY 60 tablet 4  . indomethacin (INDOCIN) 50 MG capsule TAKE 1 CAPSULE EVERY 8 HOURS AS NEEDED FOR PAIN (Patient not taking: Reported on 07/20/2015) 30 capsule 0  . omeprazole (PRILOSEC) 20 MG capsule TAKE ONE CAPSULE BY MOUTH ONCE DAILY NEEDS  TO  BE  SEEN (Patient not taking: Reported on 07/20/2015) 30 capsule 4  . Oxycodone HCl 10 MG TABS Take 1 tablet (10 mg total) by mouth every 4 (four) hours as needed (pain). (Patient not taking: Reported on 07/20/2015) 90 tablet 0  . zolpidem (AMBIEN) 5 MG tablet Take 5 mg by mouth at bedtime.      No current facility-administered medications for this visit.    ROS: See HPI for pertinent positives and negatives.   Physical Examination  Filed Vitals:   07/20/15 1152 07/20/15 1204  BP: 145/87 154/87  Pulse: 99 99  Temp: 98.1 F (36.7 C)   TempSrc: Oral   Resp: 18   Height: 5\' 6"  (1.676 m)   Weight: 144 lb (65.318 kg)   SpO2: 96%    Body mass index is 23.25 kg/(m^2).  General: A&O x 3, WDWN. Gait: normal Eyes:  PERRLA. Pulmonary: CTAB, without wheezes , rales or rhonchi. Cardiac: regular Rythm , without detected murmur.         Carotid Bruits Right Left   Positive Negative  Aorta is not palpable. Radial pulses: 2+ palpable                           VASCULAR EXAM: Extremities without ischemic changes, without Gangrene; without open wounds.  LE Pulses Right Left       FEMORAL  2+ palpable  2+ palpable        POPLITEAL  not palpable   not palpable       POSTERIOR TIBIAL  not palpable   not palpable        DORSALIS PEDIS      ANTERIOR TIBIAL not palpable  not palpable    Abdomen: soft, NT, no palpable masses. Skin: no rashes, no ulcers, feet are pink and warm. Musculoskeletal: no muscle wasting or atrophy.  Neurologic: A&O X 3; Appropriate Affect; MOTOR FUNCTION:  moving all extremities equally, motor strength 5/5 throughout. Speech is fluent/normal. CN 2-12 intact. Taciturn.     Non-Invasive Vascular Imaging: DATE: 07/20/2015 ABI (Date: 07/20/2015)  R: 0.67 (0.55, 01/06/14), DP: monophasic, PT: monophasic, TBI: 0.52  L: 0.61 (0.62), DP: monophasic, PT: monophasic, TBI: 0.44   Outside Studies/Documentation at [facility]: 11/21/13 carotid Duplex: Bilateral: 1-39% ICA stenosis. Vertebral artery flow is antegrade. Right: ICA/CCA ratio is 0.88. Mildly elevated velocities notedin the CCA, ECA, and ICA. Left: ICA/CCA ratio is 0.91. Mildly elevated velocities noted in the CCA, ECA, proximal ICA,and vertebral artery.  ASSESSMENT: Perry Kennedy is a 74 y.o. male  who is status post aortobifemoral bypass graft and aorta to superior mesenteric artery bypass graft with reimplantation of the inferior mesenteric artery. This was performed on 11/22/2013. On postoperative day 2 he suffered a stroke. During his workup he was found to have a patent foramen ovale. Lower extremity Doppler studies  revealed a left femoral DVT. This was felt to be the etiology of his stroke. He had mild carotid disease. The stroke left him with a left facial droop. He has recently been evaluated by GI. His left lower quadrant pain has improved. He states that he is actually tolerating oral intake better. He has gained some weight, 17 pounds in the last year. He has had no subsequent stroke or TIA's. Pt denies non healing wounds, denies rest pain. In terms of intermittent claudication, he states "my legs give out sometimes" , he is unable to state how far he walks before this happens. Today's ABI's indicate moderate bilateral arterial occlusive disease, all monophasic waveforms. Fortunately his DM seems well controlled, unfortunately he continues to smoke and seems to not be interested in quitting.    PLAN:  The patient was counseled re smoking cessation and given several free resources re smoking cessation.  Graduated walking program discussed.  Based on the patient's vascular studies and examination, pt will return to clinic in 1 year with ABI's, carotid Duplex, and mesenteric artery, aorto-ilaic Duplex.  I discussed in depth with the patient the nature of atherosclerosis, and emphasized the importance of maximal medical management including strict control of blood pressure, blood glucose, and lipid levels, obtaining regular exercise, and cessation of smoking.  The patient is aware that without maximal medical management the underlying atherosclerotic disease process will progress, limiting the benefit of any interventions.  The patient was given information about PAD including signs, symptoms, treatment, what symptoms should prompt the patient to seek immediate medical care, and risk reduction measures to take.  Clemon Chambers, RN, MSN, FNP-C [facility] Phone: [phone]  Clinic MD: Trula Slade  07/20/2015 12:09 PM

## 2015-07-20 NOTE — Patient Instructions (Signed)
Peripheral Vascular Disease Peripheral Vascular Disease (PVD), also called Peripheral Arterial Disease (PAD), is a circulation problem caused by cholesterol (atherosclerotic plaque) deposits in the arteries. PVD commonly occurs in the lower extremities (legs) but it can occur in other areas of the body, such as your arms. The cholesterol buildup in the arteries reduces blood flow which can cause pain and other serious problems. The presence of PVD can place a person at risk for Coronary Artery Disease (CAD).  CAUSES  Causes of PVD can be many. It is usually associated with more than one risk factor such as:   High Cholesterol.  Smoking.  Diabetes.  Lack of exercise or inactivity.  High blood pressure (hypertension).  Obesity.  Family history. SYMPTOMS   When the lower extremities are affected, patients with PVD may experience:  Leg pain with exertion or physical activity. This is called INTERMITTENT CLAUDICATION. This may present as cramping or numbness with physical activity. The location of the pain is associated with the level of blockage. For example, blockage at the abdominal level (distal abdominal aorta) may result in buttock or hip pain. Lower leg arterial blockage may result in calf pain.  As PVD becomes more severe, pain can develop with less physical activity.  In people with severe PVD, leg pain may occur at rest.  Other PVD signs and symptoms:  Leg numbness or weakness.  Coldness in the affected leg or foot, especially when compared to the other leg.  A change in leg color.  Patients with significant PVD are more prone to ulcers or sores on toes, feet or legs. These may take longer to heal or may reoccur. The ulcers or sores can become infected.  If signs and symptoms of PVD are ignored, gangrene may occur. This can result in the loss of toes or loss of an entire limb.  Not all leg pain is related to PVD. Other medical conditions can cause leg pain such  as:  Blood clots (embolism) or Deep Vein Thrombosis.  Inflammation of the blood vessels (vasculitis).  Spinal stenosis. DIAGNOSIS  Diagnosis of PVD can involve several different types of tests. These can include:  Pulse Volume Recording Method (PVR). This test is simple, painless and does not involve the use of X-rays. PVR involves measuring and comparing the blood pressure in the arms and legs. An ABI (Ankle-Brachial Index) is calculated. The normal ratio of blood pressures is 1. As this number becomes smaller, it indicates more severe disease.  < 0.95 - indicates significant narrowing in one or more leg vessels.  <0.8 - there will usually be pain in the foot, leg or buttock with exercise.  <0.4 - will usually have pain in the legs at rest.  <0.25 - usually indicates limb threatening PVD.  Doppler detection of pulses in the legs. This test is painless and checks to see if you have a pulses in your legs/feet.  A dye or contrast material (a substance that highlights the blood vessels so they show up on x-ray) may be given to help your caregiver better see the arteries for the following tests. The dye is eliminated from your body by the kidney's. Your caregiver may order blood work to check your kidney function and other laboratory values before the following tests are performed:  Magnetic Resonance Angiography (MRA). An MRA is a picture study of the blood vessels and arteries. The MRA machine uses a large magnet to produce images of the blood vessels.  Computed Tomography Angiography (CTA). A CTA   is a specialized x-ray that looks at how the blood flows in your blood vessels. An IV may be inserted into your arm so contrast dye can be injected.  Angiogram. Is a procedure that uses x-rays to look at your blood vessels. This procedure is minimally invasive, meaning a small incision (cut) is made in your groin. A small tube (catheter) is then inserted into the artery of your groin. The catheter  is guided to the blood vessel or artery your caregiver wants to examine. Contrast dye is injected into the catheter. X-rays are then taken of the blood vessel or artery. After the images are obtained, the catheter is taken out. TREATMENT  Treatment of PVD involves many interventions which may include:  Lifestyle changes:  Quitting smoking.  Exercise.  Following a low fat, low cholesterol diet.  Control of diabetes.  Foot care is very important to the PVD patient. Good foot care can help prevent infection.  Medication:  Cholesterol-lowering medicine.  Blood pressure medicine.  Anti-platelet drugs.  Certain medicines may reduce symptoms of Intermittent Claudication.  Interventional/Surgical options:  Angioplasty. An Angioplasty is a procedure that inflates a balloon in the blocked artery. This opens the blocked artery to improve blood flow.  Stent Implant. A wire mesh tube (stent) is placed in the artery. The stent expands and stays in place, allowing the artery to remain open.  Peripheral Bypass Surgery. This is a surgical procedure that reroutes the blood around a blocked artery to help improve blood flow. This type of procedure may be performed if Angioplasty or stent implants are not an option. SEEK IMMEDIATE MEDICAL CARE IF:   You develop pain or numbness in your arms or legs.  Your arm or leg turns cold, becomes blue in color.  You develop redness, warmth, swelling and pain in your arms or legs. MAKE SURE YOU:   Understand these instructions.  Will watch your condition.  Will get help right away if you are not doing well or get worse. Document Released: 12/15/2004 Document Revised: 01/30/2012 Document Reviewed: 11/11/2008 [employer] Patient Information 2015 [employer]. This information is not intended to replace advice given to you by your health care provider. Make sure you discuss any questions you have with your health care provider.    Smoking  Cessation Quitting smoking is important to your health and has many advantages. However, it is not always easy to quit since nicotine is a very addictive drug. Oftentimes, people try 3 times or more before being able to quit. This document explains the best ways for you to prepare to quit smoking. Quitting takes hard work and a lot of effort, but you can do it. ADVANTAGES OF QUITTING SMOKING  You will live longer, feel better, and live better.  Your body will feel the impact of quitting smoking almost immediately.  Within 20 minutes, blood pressure decreases. Your pulse returns to its normal level.  After 8 hours, carbon monoxide levels in the blood return to normal. Your oxygen level increases.  After 24 hours, the chance of having a heart attack starts to decrease. Your breath, hair, and body stop smelling like smoke.  After 48 hours, damaged nerve endings begin to recover. Your sense of taste and smell improve.  After 72 hours, the body is virtually free of nicotine. Your bronchial tubes relax and breathing becomes easier.  After 2 to 12 weeks, lungs can hold more air. Exercise becomes easier and circulation improves.  The risk of having a heart attack, stroke,   cancer, or lung disease is greatly reduced.  After 1 year, the risk of coronary heart disease is cut in half.  After 5 years, the risk of stroke falls to the same as a nonsmoker.  After 10 years, the risk of lung cancer is cut in half and the risk of other cancers decreases significantly.  After 15 years, the risk of coronary heart disease drops, usually to the level of a nonsmoker.  If you are pregnant, quitting smoking will improve your chances of having a healthy baby.  The people you live with, especially any children, will be healthier.  You will have extra money to spend on things other than cigarettes. QUESTIONS TO THINK ABOUT BEFORE ATTEMPTING TO QUIT You may want to talk about your answers with your health care  provider.  Why do you want to quit?  If you tried to quit in the past, what helped and what did not?  What will be the most difficult situations for you after you quit? How will you plan to handle them?  Who can help you through the tough times? Your family? Friends? A health care provider?  What pleasures do you get from smoking? What ways can you still get pleasure if you quit? Here are some questions to ask your health care provider:  How can you help me to be successful at quitting?  What medicine do you think would be best for me and how should I take it?  What should I do if I need more help?  What is smoking withdrawal like? How can I get information on withdrawal? GET READY  Set a quit date.  Change your environment by getting rid of all cigarettes, ashtrays, matches, and lighters in your home, car, or work. Do not let people smoke in your home.  Review your past attempts to quit. Think about what worked and what did not. GET SUPPORT AND ENCOURAGEMENT You have a better chance of being successful if you have help. You can get support in many ways.  Tell your family, friends, and coworkers that you are going to quit and need their support. Ask them not to smoke around you.  Get individual, group, or telephone counseling and support. Programs are available at local hospitals and health centers. Call your local health department for information about programs in your area.  Spiritual beliefs and practices may help some smokers quit.  Download a "quit meter" on your computer to keep track of quit statistics, such as how long you have gone without smoking, cigarettes not smoked, and money saved.  Get a self-help book about quitting smoking and staying off tobacco. LEARN NEW SKILLS AND BEHAVIORS  Distract yourself from urges to smoke. Talk to someone, go for a walk, or occupy your time with a task.  Change your normal routine. Take a different route to work. Drink tea  instead of coffee. Eat breakfast in a different place.  Reduce your stress. Take a hot bath, exercise, or read a book.  Plan something enjoyable to do every day. Reward yourself for not smoking.  Explore interactive web-based programs that specialize in helping you quit. GET MEDICINE AND USE IT CORRECTLY Medicines can help you stop smoking and decrease the urge to smoke. Combining medicine with the above behavioral methods and support can greatly increase your chances of successfully quitting smoking.  Nicotine replacement therapy helps deliver nicotine to your body without the negative effects and risks of smoking. Nicotine replacement therapy includes nicotine gum, lozenges,   inhalers, nasal sprays, and skin patches. Some may be available over-the-counter and others require a prescription.  Antidepressant medicine helps people abstain from smoking, but how this works is unknown. This medicine is available by prescription.  Nicotinic receptor partial agonist medicine simulates the effect of nicotine in your brain. This medicine is available by prescription. Ask your health care provider for advice about which medicines to use and how to use them based on your health history. Your health care provider will tell you what side effects to look out for if you choose to be on a medicine or therapy. Carefully read the information on the package. Do not use any other product containing nicotine while using a nicotine replacement product.  RELAPSE OR DIFFICULT SITUATIONS Most relapses occur within the first 3 months after quitting. Do not be discouraged if you start smoking again. Remember, most people try several times before finally quitting. You may have symptoms of withdrawal because your body is used to nicotine. You may crave cigarettes, be irritable, feel very hungry, cough often, get headaches, or have difficulty concentrating. The withdrawal symptoms are only temporary. They are strongest when you  first quit, but they will go away within 10-14 days. To reduce the chances of relapse, try to:  Avoid drinking alcohol. Drinking lowers your chances of successfully quitting.  Reduce the amount of caffeine you consume. Once you quit smoking, the amount of caffeine in your body increases and can give you symptoms, such as a rapid heartbeat, sweating, and anxiety.  Avoid smokers because they can make you want to smoke.  Do not let weight gain distract you. Many smokers will gain weight when they quit, usually less than 10 pounds. Eat a healthy diet and stay active. You can always lose the weight gained after you quit.  Find ways to improve your mood other than smoking. FOR MORE INFORMATION  www.smokefree.gov  Document Released: 11/01/2001 Document Revised: 03/24/2014 Document Reviewed: 02/16/2012 [employer] Patient Information 2015 [employer]. This information is not intended to replace advice given to you by your health care provider. Make sure you discuss any questions you have with your health care provider.    Smoking Cessation, Tips for Success If you are ready to quit smoking, congratulations! You have chosen to help yourself be healthier. Cigarettes bring nicotine, tar, carbon monoxide, and other irritants into your body. Your lungs, heart, and blood vessels will be able to work better without these poisons. There are many different ways to quit smoking. Nicotine gum, nicotine patches, a nicotine inhaler, or nicotine nasal spray can help with physical craving. Hypnosis, support groups, and medicines help break the habit of smoking. WHAT THINGS CAN I DO TO MAKE QUITTING EASIER?  Here are some tips to help you quit for good:  Pick a date when you will quit smoking completely. Tell all of your friends and family about your plan to quit on that date.  Do not try to slowly cut down on the number of cigarettes you are smoking. Pick a quit date and quit smoking completely starting on that  day.  Throw away all cigarettes.   Clean and remove all ashtrays from your home, work, and car.  On a card, write down your reasons for quitting. Carry the card with you and read it when you get the urge to smoke.  Cleanse your body of nicotine. Drink enough water and fluids to keep your urine clear or pale yellow. Do this after quitting to flush the nicotine from   your body.  Learn to predict your moods. Do not let a bad situation be your excuse to have a cigarette. Some situations in your life might tempt you into wanting a cigarette.  Never have "just one" cigarette. It leads to wanting another and another. Remind yourself of your decision to quit.  Change habits associated with smoking. If you smoked while driving or when feeling stressed, try other activities to replace smoking. Stand up when drinking your coffee. Brush your teeth after eating. Sit in a different chair when you read the paper. Avoid alcohol while trying to quit, and try to drink fewer caffeinated beverages. Alcohol and caffeine may urge you to smoke.  Avoid foods and drinks that can trigger a desire to smoke, such as sugary or spicy foods and alcohol.  Ask people who smoke not to smoke around you.  Have something planned to do right after eating or having a cup of coffee. For example, plan to take a walk or exercise.  Try a relaxation exercise to calm you down and decrease your stress. Remember, you may be tense and nervous for the first 2 weeks after you quit, but this will pass.  Find new activities to keep your hands busy. Play with a pen, coin, or rubber band. Doodle or draw things on paper.  Brush your teeth right after eating. This will help cut down on the craving for the taste of tobacco after meals. You can also try mouthwash.   Use oral substitutes in place of cigarettes. Try using lemon drops, carrots, cinnamon sticks, or chewing gum. Keep them handy so they are available when you have the urge to  smoke.  When you have the urge to smoke, try deep breathing.  Designate your home as a nonsmoking area.  If you are a heavy smoker, ask your health care provider about a prescription for nicotine chewing gum. It can ease your withdrawal from nicotine.  Reward yourself. Set aside the cigarette money you save and buy yourself something nice.  Look for support from others. Join a support group or smoking cessation program. Ask someone at home or at work to help you with your plan to quit smoking.  Always ask yourself, "Do I need this cigarette or is this just a reflex?" Tell yourself, "Today, I choose not to smoke," or "I do not want to smoke." You are reminding yourself of your decision to quit.  Do not replace cigarette smoking with electronic cigarettes (commonly called e-cigarettes). The safety of e-cigarettes is unknown, and some may contain harmful chemicals.  If you relapse, do not give up! Plan ahead and think about what you will do the next time you get the urge to smoke. HOW WILL I FEEL WHEN I QUIT SMOKING? You may have symptoms of withdrawal because your body is used to nicotine (the addictive substance in cigarettes). You may crave cigarettes, be irritable, feel very hungry, cough often, get headaches, or have difficulty concentrating. The withdrawal symptoms are only temporary. They are strongest when you first quit but will go away within 10-14 days. When withdrawal symptoms occur, stay in control. Think about your reasons for quitting. Remind yourself that these are signs that your body is healing and getting used to being without cigarettes. Remember that withdrawal symptoms are easier to treat than the major diseases that smoking can cause.  Even after the withdrawal is over, expect periodic urges to smoke. However, these cravings are generally short lived and will go away whether you   smoke or not. Do not smoke! WHAT RESOURCES ARE AVAILABLE TO HELP ME QUIT SMOKING? Your health care  provider can direct you to community resources or hospitals for support, which may include:  Group support.  Education.  Hypnosis.  Therapy. Document Released: 08/05/2004 Document Revised: 03/24/2014 Document Reviewed: 04/25/2013 [employer] Patient Information 2015 [employer]. This information is not intended to replace advice given to you by your health care provider. Make sure you discuss any questions you have with your health care provider.  

## 2015-07-23 ENCOUNTER — Other Ambulatory Visit: Payer: Self-pay | Admitting: Nurse Practitioner

## 2015-07-26 ENCOUNTER — Other Ambulatory Visit: Payer: Self-pay | Admitting: Nurse Practitioner

## 2015-08-04 ENCOUNTER — Other Ambulatory Visit: Payer: Self-pay | Admitting: Cardiology

## 2015-08-04 ENCOUNTER — Other Ambulatory Visit: Payer: Self-pay | Admitting: Nurse Practitioner

## 2015-08-26 ENCOUNTER — Other Ambulatory Visit: Payer: Self-pay | Admitting: Nurse Practitioner

## 2015-09-04 ENCOUNTER — Encounter: Payer: Self-pay | Admitting: Nurse Practitioner

## 2015-09-04 ENCOUNTER — Ambulatory Visit (INDEPENDENT_AMBULATORY_CARE_PROVIDER_SITE_OTHER): Payer: Medicare Other | Admitting: Nurse Practitioner

## 2015-09-04 VITALS — BP 148/90 | HR 102 | Temp 97.6°F | Ht 66.0 in | Wt 144.0 lb

## 2015-09-04 DIAGNOSIS — Z23 Encounter for immunization: Secondary | ICD-10-CM | POA: Diagnosis not present

## 2015-09-04 DIAGNOSIS — E1069 Type 1 diabetes mellitus with other specified complication: Secondary | ICD-10-CM

## 2015-09-04 DIAGNOSIS — K219 Gastro-esophageal reflux disease without esophagitis: Secondary | ICD-10-CM

## 2015-09-04 DIAGNOSIS — E785 Hyperlipidemia, unspecified: Secondary | ICD-10-CM | POA: Diagnosis not present

## 2015-09-04 DIAGNOSIS — E119 Type 2 diabetes mellitus without complications: Secondary | ICD-10-CM

## 2015-09-04 DIAGNOSIS — I10 Essential (primary) hypertension: Secondary | ICD-10-CM | POA: Diagnosis not present

## 2015-09-04 DIAGNOSIS — F329 Major depressive disorder, single episode, unspecified: Secondary | ICD-10-CM

## 2015-09-04 DIAGNOSIS — F17200 Nicotine dependence, unspecified, uncomplicated: Secondary | ICD-10-CM

## 2015-09-04 DIAGNOSIS — I2510 Atherosclerotic heart disease of native coronary artery without angina pectoris: Secondary | ICD-10-CM | POA: Diagnosis not present

## 2015-09-04 DIAGNOSIS — I739 Peripheral vascular disease, unspecified: Secondary | ICD-10-CM | POA: Diagnosis not present

## 2015-09-04 DIAGNOSIS — G4700 Insomnia, unspecified: Secondary | ICD-10-CM | POA: Diagnosis not present

## 2015-09-04 DIAGNOSIS — F32A Depression, unspecified: Secondary | ICD-10-CM

## 2015-09-04 LAB — POCT GLYCOSYLATED HEMOGLOBIN (HGB A1C): HEMOGLOBIN A1C: 6.4

## 2015-09-04 LAB — POCT UA - MICROALBUMIN: Microalbumin Ur, POC: 50 mg/L

## 2015-09-04 MED ORDER — RANITIDINE HCL 150 MG PO TABS: 150.0000 mg | ORAL_TABLET | Freq: Two times a day (BID) | ORAL | Status: DC

## 2015-09-04 MED ORDER — CLOPIDOGREL BISULFATE 75 MG PO TABS: 75.0000 mg | ORAL_TABLET | Freq: Every day | ORAL | Status: DC

## 2015-09-04 MED ORDER — CILOSTAZOL 100 MG PO TABS: 100.0000 mg | ORAL_TABLET | Freq: Two times a day (BID) | ORAL | Status: DC

## 2015-09-04 MED ORDER — MIRTAZAPINE 30 MG PO TABS: 30.0000 mg | ORAL_TABLET | Freq: Every day | ORAL | Status: DC

## 2015-09-04 MED ORDER — CITALOPRAM HYDROBROMIDE 20 MG PO TABS: 20.0000 mg | ORAL_TABLET | Freq: Every day | ORAL | Status: DC

## 2015-09-04 MED ORDER — ATORVASTATIN CALCIUM 40 MG PO TABS: ORAL_TABLET | ORAL | Status: DC

## 2015-09-04 MED ORDER — LISINOPRIL 20 MG PO TABS: 20.0000 mg | ORAL_TABLET | Freq: Every day | ORAL | Status: DC

## 2015-09-04 MED ORDER — METFORMIN HCL 500 MG PO TABS: ORAL_TABLET | ORAL | Status: DC

## 2015-09-04 MED ORDER — OMEPRAZOLE 20 MG PO CPDR: DELAYED_RELEASE_CAPSULE | ORAL | Status: DC

## 2015-09-04 NOTE — Progress Notes (Signed)
Subjective:    Patient ID: Perry Kennedy, male    DOB: 1941/06/16, 74 y.o.   MRN: [id number]  Hypertension This is a chronic problem. The current episode started more than 1 year ago. The problem has been waxing and waning since onset. Pertinent negatives include no chest pain or headaches. Risk factors for coronary artery disease include diabetes mellitus, dyslipidemia, obesity and male gender. Past treatments include ACE inhibitors. The current treatment provides moderate improvement. Hypertensive end-organ damage includes CAD/MI and PVD.  Hyperlipidemia This is a chronic problem. The current episode started more than 1 year ago. Recent lipid tests were reviewed and are variable. Exacerbating diseases include diabetes and obesity. He has no history of hypothyroidism. Pertinent negatives include no chest pain. Current antihyperlipidemic treatment includes statins. The current treatment provides moderate improvement of lipids. Compliance problems include adherence to diet and adherence to exercise.  Risk factors for coronary artery disease include dyslipidemia, hypertension, male sex and obesity.  Diabetes He presents for his follow-up diabetic visit. His disease course has been fluctuating. Pertinent negatives for hypoglycemia include no dizziness or headaches. Pertinent negatives for diabetes include no chest pain, no fatigue, no foot paresthesias, no polydipsia and no polyphagia. Diabetic complications include PVD. Risk factors for coronary artery disease include diabetes mellitus, dyslipidemia, hypertension, male sex and obesity. Current diabetic treatment includes oral agent (monotherapy). He is compliant with treatment some of the time. His weight is stable. When asked about meal planning, he reported none. He has not had a previous visit with a dietitian. He rarely participates in exercise. There is no change in his home blood glucose trend. His breakfast blood glucose is taken between 9-10 am.  His breakfast blood glucose range is generally 140-180 mg/dl. His overall blood glucose range is 140-180 mg/dl. An ACE inhibitor/angiotensin II receptor blocker is not being taken. He does not see a podiatrist.Eye exam is not current.  CAD/PVD See cardiovascular surgeon every 6 months- no recent problems- on pletal without probems GERD On zantac daily which keeps symptoms under control Insomnia remeron   nightly helps him rest well- no c/o fatigue when awakening. Does not always take both meds at bedtime. Depression Currently on celexa- no c/o side effects- says that keeps him from feeling down. Able to get out of house more if he takes meds     Review of Systems  Constitutional: Negative for fatigue.  HENT: Negative.   Respiratory: Negative.   Cardiovascular: Negative.  Negative for chest pain.  Gastrointestinal: Negative.   Endocrine: Negative for polydipsia and polyphagia.  Genitourinary: Negative.   Neurological: Negative.  Negative for dizziness and headaches.  Psychiatric/Behavioral: Negative.   All other systems reviewed and are negative.      Objective:   Physical Exam  Constitutional: He is oriented to person, place, and time. He appears well-nourished.  HENT:  Head: Normocephalic.  Right Ear: External ear normal.  Left Ear: External ear normal.  Nose: Nose normal.  Mouth/Throat: Oropharynx is clear and moist.  Eyes: EOM are normal. Pupils are equal, round, and reactive to light.  Neck: Normal range of motion. Neck supple. No JVD present. No thyromegaly present.  Cardiovascular: Normal rate, regular rhythm, normal heart sounds and intact distal pulses.  Exam reveals no gallop and no friction rub.   No murmur heard. Pulmonary/Chest: Effort normal and breath sounds normal. No respiratory distress. He has no wheezes. He has no rales. He exhibits no tenderness.  Abdominal: Soft. Bowel sounds are normal. He exhibits no  mass. There is no tenderness.  Genitourinary:  Prostate normal and penis normal.  Musculoskeletal: Normal range of motion. He exhibits no edema.  Lymphadenopathy:    He has no cervical adenopathy.  Neurological: He is alert and oriented to person, place, and time. No cranial nerve deficit.  Skin: Skin is warm and dry.  Psychiatric: He has a normal mood and affect. His behavior is normal. Judgment and thought content normal.    BP 148/90 mmHg  Pulse 102  Temp(Src) 97.6 F (36.4 C) (Oral)  Ht 5' 6"  (1.676 m)  Wt 144 lb (65.318 kg)  BMI 23.25 kg/m2  Results for orders placed or performed in visit on 09/04/15  POCT glycosylated hemoglobin (Hb A1C)  Result Value Ref Range   Hemoglobin A1C 6.4   POCT UA - Microalbumin  Result Value Ref Range   Microalbumin Ur, POC 50 mg/L          Assessment & Plan:  1. Essential hypertension Do not add salt to diet - CMP14+EGFR - lisinopril (PRINIVIL,ZESTRIL) 20 MG tablet; Take 1 tablet (20 mg total) by mouth daily.  Dispense: 90 tablet; Refill: 1  2. Atherosclerosis of native coronary artery of native heart without angina pectoris Keep follow up with peripheral vascular specialist Changed ASA to plaxix - cilostazol (PLETAL) 100 MG tablet; Take 1 tablet (100 mg total) by mouth 2 (two) times daily.  Dispense: 180 tablet; Refill: 1 - clopidogrel (PLAVIX) 75 MG tablet; Take 1 tablet (75 mg total) by mouth daily.  Dispense: 90 tablet; Refill: 1  3. PERIPHERAL VASCULAR DISEASE  4. Gastroesophageal reflux disease without esophagitis Avoid spicy foods Do not eat 2 hours prior to bedtime - ranitidine (ZANTAC) 150 MG tablet; Take 1 tablet (150 mg total) by mouth 2 (two) times daily.  Dispense: 180 tablet; Refill: 1 - omeprazole (PRILOSEC) 20 MG capsule; TAKE ONE CAPSULE BY MOUTH ONCE DAILY  Dispense: 90 capsule; Refill: 1  5. Type 2 diabetes mellitus without complication, without long-term current use of insulin (HCC) Continue to watch carbs in diet - POCT glycosylated hemoglobin (Hb A1C) -  POCT UA - Microalbumin - Microalbumin, urine - metFORMIN (GLUCOPHAGE) 500 MG tablet; TAKE ONE TABLET BY MOUTH TWICE DAILY WITH MEALS. NEEDS APPOINTMENT  Dispense: 180 tablet; Refill: 1  6. Hyperlipidemia due to type 1 diabetes mellitus (HCC) Low fat diet - Lipid panel - atorvastatin (LIPITOR) 40 MG tablet; TAKE ONE TABLET BY MOUTH ONCE DAILY NEEDS  TO  BE  SEEN  FOR  ADDITIONAL  REFILLS  Dispense: 90 tablet; Refill: 1  7. SMOKER Smoking cessationencouraged  8. Depression Stress management - citalopram (CELEXA) 20 MG tablet; Take 1 tablet (20 mg total) by mouth daily.  Dispense: 90 tablet; Refill: 1  9. Insomnia Bedtime ritual - mirtazapine (REMERON) 30 MG tablet; Take 1 tablet (30 mg total) by mouth daily with breakfast.  Dispense: 90 tablet; Refill: 1    Labs pending Health maintenance reviewed Diet and exercise encouraged Continue all meds Follow up  In 3 month   [place], FNP

## 2015-09-04 NOTE — Patient Instructions (Signed)

## 2015-09-04 NOTE — Addendum Note (Signed)
Addended by: Rolena Infante on: 09/04/2015 05:26 PM   Modules accepted: Orders

## 2015-09-05 LAB — CMP14+EGFR
ALK PHOS: 127 IU/L — AB (ref 39–117)
ALT: 15 IU/L (ref 0–44)
AST: 17 IU/L (ref 0–40)
Albumin/Globulin Ratio: 1.4 (ref 1.1–2.5)
Albumin: 4.2 g/dL (ref 3.5–4.8)
BUN / CREAT RATIO: 7 — AB (ref 10–22)
BUN: 7 mg/dL — AB (ref 8–27)
Bilirubin Total: 0.3 mg/dL (ref 0.0–1.2)
CALCIUM: 9.7 mg/dL (ref 8.6–10.2)
CO2: 23 mmol/L (ref 18–29)
CREATININE: 0.99 mg/dL (ref 0.76–1.27)
Chloride: 89 mmol/L — ABNORMAL LOW (ref 97–108)
GFR calc Af Amer: 87 mL/min/{1.73_m2} (ref >59)
GFR calc non Af Amer: 75 mL/min/{1.73_m2} (ref >59)
GLOBULIN, TOTAL: 3.1 g/dL (ref 1.5–4.5)
Glucose: 141 mg/dL — ABNORMAL HIGH (ref 65–99)
Potassium: 4.6 mmol/L (ref 3.5–5.2)
Sodium: 129 mmol/L — ABNORMAL LOW (ref 134–144)
TOTAL PROTEIN: 7.3 g/dL (ref 6.0–8.5)

## 2015-09-05 LAB — LIPID PANEL
CHOL/HDL RATIO: 3 ratio (ref 0.0–5.0)
Cholesterol, Total: 115 mg/dL (ref 100–199)
HDL: 38 mg/dL — ABNORMAL LOW (ref >39)
LDL Calculated: 44 mg/dL (ref 0–99)
Triglycerides: 166 mg/dL — ABNORMAL HIGH (ref 0–149)
VLDL Cholesterol Cal: 33 mg/dL (ref 5–40)

## 2015-09-05 LAB — MICROALBUMIN, URINE: MICROALBUM., U, RANDOM: 214.7 ug/mL

## 2015-09-07 ENCOUNTER — Ambulatory Visit: Payer: Self-pay | Admitting: Neurology

## 2015-12-29 ENCOUNTER — Encounter: Payer: Self-pay | Admitting: Cardiology

## 2016-02-29 ENCOUNTER — Ambulatory Visit (INDEPENDENT_AMBULATORY_CARE_PROVIDER_SITE_OTHER): Payer: Medicare Other | Admitting: Nurse Practitioner

## 2016-02-29 ENCOUNTER — Encounter: Payer: Self-pay | Admitting: Nurse Practitioner

## 2016-02-29 VITALS — BP 134/80 | HR 114 | Temp 98.2°F | Ht 66.0 in | Wt 143.0 lb

## 2016-02-29 DIAGNOSIS — E119 Type 2 diabetes mellitus without complications: Secondary | ICD-10-CM

## 2016-02-29 DIAGNOSIS — I10 Essential (primary) hypertension: Secondary | ICD-10-CM | POA: Diagnosis not present

## 2016-02-29 DIAGNOSIS — I771 Stricture of artery: Secondary | ICD-10-CM | POA: Diagnosis not present

## 2016-02-29 DIAGNOSIS — F17200 Nicotine dependence, unspecified, uncomplicated: Secondary | ICD-10-CM | POA: Diagnosis not present

## 2016-02-29 DIAGNOSIS — K219 Gastro-esophageal reflux disease without esophagitis: Secondary | ICD-10-CM | POA: Diagnosis not present

## 2016-02-29 DIAGNOSIS — E785 Hyperlipidemia, unspecified: Secondary | ICD-10-CM | POA: Diagnosis not present

## 2016-02-29 DIAGNOSIS — R0989 Other specified symptoms and signs involving the circulatory and respiratory systems: Secondary | ICD-10-CM | POA: Diagnosis not present

## 2016-02-29 DIAGNOSIS — I2510 Atherosclerotic heart disease of native coronary artery without angina pectoris: Secondary | ICD-10-CM

## 2016-02-29 DIAGNOSIS — I25810 Atherosclerosis of coronary artery bypass graft(s) without angina pectoris: Secondary | ICD-10-CM | POA: Diagnosis not present

## 2016-02-29 DIAGNOSIS — G4700 Insomnia, unspecified: Secondary | ICD-10-CM | POA: Diagnosis not present

## 2016-02-29 DIAGNOSIS — F32A Depression, unspecified: Secondary | ICD-10-CM

## 2016-02-29 DIAGNOSIS — E1069 Type 1 diabetes mellitus with other specified complication: Secondary | ICD-10-CM

## 2016-02-29 DIAGNOSIS — F329 Major depressive disorder, single episode, unspecified: Secondary | ICD-10-CM | POA: Diagnosis not present

## 2016-02-29 DIAGNOSIS — I639 Cerebral infarction, unspecified: Secondary | ICD-10-CM

## 2016-02-29 LAB — BAYER DCA HB A1C WAIVED: HB A1C (BAYER DCA - WAIVED): 6.7 % (ref <7.0)

## 2016-02-29 MED ORDER — RANITIDINE HCL 150 MG PO TABS: 150.0000 mg | ORAL_TABLET | Freq: Two times a day (BID) | ORAL | Status: DC

## 2016-02-29 MED ORDER — METFORMIN HCL 500 MG PO TABS: ORAL_TABLET | ORAL | Status: DC

## 2016-02-29 MED ORDER — OMEPRAZOLE 20 MG PO CPDR: DELAYED_RELEASE_CAPSULE | ORAL | Status: DC

## 2016-02-29 MED ORDER — LISINOPRIL 20 MG PO TABS: 20.0000 mg | ORAL_TABLET | Freq: Every day | ORAL | Status: DC

## 2016-02-29 MED ORDER — ATORVASTATIN CALCIUM 40 MG PO TABS: ORAL_TABLET | ORAL | Status: DC

## 2016-02-29 MED ORDER — CLOPIDOGREL BISULFATE 75 MG PO TABS: 75.0000 mg | ORAL_TABLET | Freq: Every day | ORAL | Status: DC

## 2016-02-29 MED ORDER — CILOSTAZOL 100 MG PO TABS: 100.0000 mg | ORAL_TABLET | Freq: Two times a day (BID) | ORAL | Status: DC

## 2016-02-29 MED ORDER — MIRTAZAPINE 30 MG PO TABS: 30.0000 mg | ORAL_TABLET | Freq: Every day | ORAL | Status: DC

## 2016-02-29 MED ORDER — CITALOPRAM HYDROBROMIDE 20 MG PO TABS: 20.0000 mg | ORAL_TABLET | Freq: Every day | ORAL | Status: DC

## 2016-02-29 NOTE — Progress Notes (Signed)
Subjective:    Patient ID: Perry Kennedy, male    DOB: [birth year], 75 y.o.   MRN: [id number]  Patient here today for follow up of chronic medical problems.  Outpatient Encounter Prescriptions as of 02/29/2016  Medication Sig  . atorvastatin (LIPITOR) 40 MG tablet TAKE ONE TABLET BY MOUTH ONCE DAILY NEEDS  TO  BE  SEEN  FOR  ADDITIONAL  REFILLS  . cilostazol (PLETAL) 100 MG tablet Take 1 tablet (100 mg total) by mouth 2 (two) times daily.  . citalopram (CELEXA) 20 MG tablet Take 1 tablet (20 mg total) by mouth daily.  . clopidogrel (PLAVIX) 75 MG tablet Take 1 tablet (75 mg total) by mouth daily.  Marland Kitchen lisinopril (PRINIVIL,ZESTRIL) 20 MG tablet Take 1 tablet (20 mg total) by mouth daily.  . metFORMIN (GLUCOPHAGE) 500 MG tablet TAKE ONE TABLET BY MOUTH TWICE DAILY WITH MEALS. NEEDS APPOINTMENT  . mirtazapine (REMERON) 30 MG tablet Take 1 tablet (30 mg total) by mouth daily with breakfast.  . omeprazole (PRILOSEC) 20 MG capsule TAKE ONE CAPSULE BY MOUTH ONCE DAILY  . ranitidine (ZANTAC) 150 MG tablet Take 1 tablet (150 mg total) by mouth 2 (two) times daily.   No facility-administered encounter medications on file as of 02/29/2016.    Hypertension This is a chronic problem. The current episode started more than 1 year ago. The problem has been waxing and waning since onset. Pertinent negatives include no chest pain or headaches. Risk factors for coronary artery disease include diabetes mellitus, dyslipidemia, obesity and male gender. Past treatments include ACE inhibitors. The current treatment provides moderate improvement. Hypertensive end-organ damage includes CAD/MI and PVD.  Hyperlipidemia This is a chronic problem. The current episode started more than 1 year ago. Recent lipid tests were reviewed and are variable. Exacerbating diseases include diabetes and obesity. He has no history of hypothyroidism. Pertinent negatives include no chest pain. Current antihyperlipidemic treatment includes  statins. The current treatment provides moderate improvement of lipids. Compliance problems include adherence to diet and adherence to exercise.  Risk factors for coronary artery disease include dyslipidemia, hypertension, male sex and obesity.  Diabetes He presents for his follow-up diabetic visit. His disease course has been fluctuating. Pertinent negatives for hypoglycemia include no dizziness or headaches. Pertinent negatives for diabetes include no chest pain, no fatigue, no foot paresthesias, no polydipsia and no polyphagia. Diabetic complications include PVD. Risk factors for coronary artery disease include diabetes mellitus, dyslipidemia, hypertension, male sex and obesity. Current diabetic treatment includes oral agent (monotherapy). He is compliant with treatment some of the time. His weight is stable. When asked about meal planning, he reported none. He has not had a previous visit with a dietitian. He rarely participates in exercise. There is no change in his home blood glucose trend. His breakfast blood glucose is taken between 9-10 am. His breakfast blood glucose range is generally 140-180 mg/dl. His overall blood glucose range is 140-180 mg/dl. An ACE inhibitor/angiotensin II receptor blocker is not being taken. He does not see a podiatrist.Eye exam is not current.  CAD/PVD See cardiovascular surgeon every 6 months- no recent problems- on pletal without probems GERD On zantac daily which keeps symptoms under control Insomnia remeron   nightly helps him rest well- no c/o fatigue when awakening. Does not always take both meds at bedtime. Depression Currently on celexa- no c/o side effects- says that keeps him from feeling down. Able to get out of house more if he takes meds Hx CVA/CAD/[place] artery bypass  Currently on plavix and pletal- no bleeding- no bruising   Review of Systems  Constitutional: Negative for fatigue.  HENT: Negative.   Respiratory: Negative.   Cardiovascular:  Negative.  Negative for chest pain.  Gastrointestinal: Negative.   Endocrine: Negative for polydipsia and polyphagia.  Genitourinary: Negative.   Neurological: Negative.  Negative for dizziness and headaches.  Psychiatric/Behavioral: Negative.   All other systems reviewed and are negative.      Objective:   Physical Exam  Constitutional: He is oriented to person, place, and time. He appears well-nourished.  HENT:  Head: Normocephalic.  Right Ear: External ear normal.  Left Ear: External ear normal.  Nose: Nose normal.  Mouth/Throat: Oropharynx is clear and moist.  Eyes: EOM are normal. Pupils are equal, round, and reactive to light.  Neck: Normal range of motion. Neck supple. No JVD present. No thyromegaly present.  Cardiovascular: Normal rate, regular rhythm, normal heart sounds and intact distal pulses.  Exam reveals no gallop and no friction rub.   No murmur heard. Pulmonary/Chest: Effort normal and breath sounds normal. No respiratory distress. He has no wheezes. He has no rales. He exhibits no tenderness.  Abdominal: Soft. Bowel sounds are normal. He exhibits no mass. There is no tenderness.  Genitourinary: Prostate normal and penis normal.  Musculoskeletal: Normal range of motion. He exhibits no edema.  Lymphadenopathy:    He has no cervical adenopathy.  Neurological: He is alert and oriented to person, place, and time. No cranial nerve deficit.  Skin: Skin is warm and dry.  Psychiatric: He has a normal mood and affect. His behavior is normal. Judgment and thought content normal.    BP 134/80 mmHg  Pulse 114  Temp(Src) 98.2 F (36.8 C) (Oral)  Ht 5' 6"  (1.676 m)  Wt 143 lb (64.864 kg)  BMI 23.09 kg/m2 hgba1c- 6.7%     Assessment & Plan:  1. Essential hypertension Do not add salt to diet - CMP14+EGFR - lisinopril (PRINIVIL,ZESTRIL) 20 MG tablet; Take 1 tablet (20 mg total) by mouth daily.  Dispense: 90 tablet; Refill: 1  2. Type 2 diabetes mellitus without  complication, without long-term current use of insulin (HCC) Continue to watch carbs in diet - Bayer DCA Hb A1c Waived - metFORMIN (GLUCOPHAGE) 500 MG tablet; TAKE ONE TABLET BY MOUTH TWICE DAILY WITH MEALS. NEEDS APPOINTMENT  Dispense: 180 tablet; Refill: 1  3. Hyperlipidemia due to type 1 diabetes mellitus (HCC) Low fta diet - Lipid panel - atorvastatin (LIPITOR) 40 MG tablet; TAKE ONE TABLET BY MOUTH ONCE DAILY NEEDS  TO  BE  SEEN  FOR  ADDITIONAL  REFILLS  Dispense: 90 tablet; Refill: 1  4. Atherosclerosis of native coronary artery of native heart without angina pectoris - cilostazol (PLETAL) 100 MG tablet; Take 1 tablet (100 mg total) by mouth 2 (two) times daily.  Dispense: 180 tablet; Refill: 1 - clopidogrel (PLAVIX) 75 MG tablet; Take 1 tablet (75 mg total) by mouth daily.  Dispense: 90 tablet; Refill: 1  5. Depression Stress management - citalopram (CELEXA) 20 MG tablet; Take 1 tablet (20 mg total) by mouth daily.  Dispense: 90 tablet; Refill: 1  6. Gastroesophageal reflux disease without esophagitis Avoid spicy foods Do not eat 2 hours prior to bedtime - ranitidine (ZANTAC) 150 MG tablet; Take 1 tablet (150 mg total) by mouth 2 (two) times daily.  Dispense: 180 tablet; Refill: 1 - omeprazole (PRILOSEC) 20 MG capsule; TAKE ONE CAPSULE BY MOUTH ONCE DAILY  Dispense: 90 capsule; Refill: 1  7. Insomnia Bedtime ritual - mirtazapine (REMERON) 30 MG tablet; Take 1 tablet (30 mg total) by mouth daily with breakfast.  Dispense: 90 tablet; Refill: 1  8. 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 Acute CVA (cerebrovascular accident) ([place])  10. SMOKER Smoking cessation encouraged  11. Coronary artery disease involving coronary bypass graft of native heart without angina pectoris    Labs pending Health maintenance reviewed Diet and exercise encouraged Continue all meds Follow up  In 3 months   [place], FNP

## 2016-02-29 NOTE — Patient Instructions (Signed)
Smoking Cessation, Tips for Success If you are ready to quit smoking, congratulations! You have chosen to help yourself be healthier. Cigarettes bring nicotine, tar, carbon monoxide, and other irritants into your body. Your lungs, heart, and blood vessels will be able to work better without these poisons. There are many different ways to quit smoking. Nicotine gum, nicotine patches, a nicotine inhaler, or nicotine nasal spray can help with physical craving. Hypnosis, support groups, and medicines help break the habit of smoking. WHAT THINGS CAN I DO TO MAKE QUITTING EASIER?  Here are some tips to help you quit for good:  Pick a date when you will quit smoking completely. Tell all of your friends and family about your plan to quit on that date.  Do not try to slowly cut down on the number of cigarettes you are smoking. Pick a quit date and quit smoking completely starting on that day.  Throw away all cigarettes.   Clean and remove all ashtrays from your home, work, and car.  On a card, write down your reasons for quitting. Carry the card with you and read it when you get the urge to smoke.  Cleanse your body of nicotine. Drink enough water and fluids to keep your urine clear or pale yellow. Do this after quitting to flush the nicotine from your body.  Learn to predict your moods. Do not let a bad situation be your excuse to have a cigarette. Some situations in your life might tempt you into wanting a cigarette.  Never have "just one" cigarette. It leads to wanting another and another. Remind yourself of your decision to quit.  Change habits associated with smoking. If you smoked while driving or when feeling stressed, try other activities to replace smoking. Stand up when drinking your coffee. Brush your teeth after eating. Sit in a different chair when you read the paper. Avoid alcohol while trying to quit, and try to drink fewer caffeinated beverages. Alcohol and caffeine may urge you to  smoke.  Avoid foods and drinks that can trigger a desire to smoke, such as sugary or spicy foods and alcohol.  Ask people who smoke not to smoke around you.  Have something planned to do right after eating or having a cup of coffee. For example, plan to take a walk or exercise.  Try a relaxation exercise to calm you down and decrease your stress. Remember, you may be tense and nervous for the first 2 weeks after you quit, but this will pass.  Find new activities to keep your hands busy. Play with a pen, coin, or rubber band. Doodle or draw things on paper.  Brush your teeth right after eating. This will help cut down on the craving for the taste of tobacco after meals. You can also try mouthwash.   Use oral substitutes in place of cigarettes. Try using lemon drops, carrots, cinnamon sticks, or chewing gum. Keep them handy so they are available when you have the urge to smoke.  When you have the urge to smoke, try deep breathing.  Designate your home as a nonsmoking area.  If you are a heavy smoker, ask your health care provider about a prescription for nicotine chewing gum. It can ease your withdrawal from nicotine.  Reward yourself. Set aside the cigarette money you save and buy yourself something nice.  Look for support from others. Join a support group or smoking cessation program. Ask someone at home or at work to help you with your plan   to quit smoking.  Always ask yourself, "Do I need this cigarette or is this just a reflex?" Tell yourself, "Today, I choose not to smoke," or "I do not want to smoke." You are reminding yourself of your decision to quit.  Do not replace cigarette smoking with electronic cigarettes (commonly called e-cigarettes). The safety of e-cigarettes is unknown, and some may contain harmful chemicals.  If you relapse, do not give up! Plan ahead and think about what you will do the next time you get the urge to smoke. HOW WILL I FEEL WHEN I QUIT SMOKING? You  may have symptoms of withdrawal because your body is used to nicotine (the addictive substance in cigarettes). You may crave cigarettes, be irritable, feel very hungry, cough often, get headaches, or have difficulty concentrating. The withdrawal symptoms are only temporary. They are strongest when you first quit but will go away within 10-14 days. When withdrawal symptoms occur, stay in control. Think about your reasons for quitting. Remind yourself that these are signs that your body is healing and getting used to being without cigarettes. Remember that withdrawal symptoms are easier to treat than the major diseases that smoking can cause.  Even after the withdrawal is over, expect periodic urges to smoke. However, these cravings are generally short lived and will go away whether you smoke or not. Do not smoke! WHAT RESOURCES ARE AVAILABLE TO HELP ME QUIT SMOKING? Your health care provider can direct you to community resources or hospitals for support, which may include:  Group support.  Education.  Hypnosis.  Therapy.   This information is not intended to replace advice given to you by your health care provider. Make sure you discuss any questions you have with your health care provider.   Document Released: 08/05/2004 Document Revised: 11/28/2014 Document Reviewed: 04/25/2013 Elsevier Interactive Patient Education 2016 [employer]  

## 2016-02-29 NOTE — Addendum Note (Signed)
Addended by: Chevis Pretty on: 02/29/2016 04:17 PM   Modules accepted: Orders

## 2016-03-01 LAB — CMP14+EGFR
ALBUMIN: 3.9 g/dL (ref 3.5–4.8)
ALK PHOS: 110 IU/L (ref 39–117)
ALT: 15 IU/L (ref 0–44)
AST: 18 IU/L (ref 0–40)
Albumin/Globulin Ratio: 1.2 (ref 1.2–2.2)
BILIRUBIN TOTAL: 0.3 mg/dL (ref 0.0–1.2)
BUN / CREAT RATIO: 10 (ref 10–24)
BUN: 8 mg/dL (ref 8–27)
CHLORIDE: 90 mmol/L — AB (ref 96–106)
CO2: 19 mmol/L (ref 18–29)
Calcium: 9.1 mg/dL (ref 8.6–10.2)
Creatinine, Ser: 0.81 mg/dL (ref 0.76–1.27)
GFR calc Af Amer: 102 mL/min/{1.73_m2} (ref >59)
GFR calc non Af Amer: 88 mL/min/{1.73_m2} (ref >59)
GLUCOSE: 179 mg/dL — AB (ref 65–99)
Globulin, Total: 3.2 g/dL (ref 1.5–4.5)
Potassium: 4.6 mmol/L (ref 3.5–5.2)
Sodium: 128 mmol/L — ABNORMAL LOW (ref 134–144)
Total Protein: 7.1 g/dL (ref 6.0–8.5)

## 2016-03-01 LAB — LIPID PANEL
CHOLESTEROL TOTAL: 100 mg/dL (ref 100–199)
Chol/HDL Ratio: 2.9 ratio units (ref 0.0–5.0)
HDL: 35 mg/dL — ABNORMAL LOW (ref >39)
LDL Calculated: 35 mg/dL (ref 0–99)
Triglycerides: 149 mg/dL (ref 0–149)
VLDL CHOLESTEROL CAL: 30 mg/dL (ref 5–40)

## 2016-03-11 ENCOUNTER — Ambulatory Visit (HOSPITAL_COMMUNITY): Payer: Medicare Other

## 2016-03-14 ENCOUNTER — Ambulatory Visit (HOSPITAL_COMMUNITY): Payer: Medicare Other

## 2016-03-22 ENCOUNTER — Encounter: Payer: Self-pay | Admitting: Internal Medicine

## 2016-07-28 ENCOUNTER — Encounter: Payer: Self-pay | Admitting: Family

## 2016-08-01 ENCOUNTER — Encounter (HOSPITAL_COMMUNITY): Payer: Self-pay

## 2016-08-01 ENCOUNTER — Ambulatory Visit: Payer: Self-pay | Admitting: Family

## 2016-09-07 ENCOUNTER — Other Ambulatory Visit: Payer: Self-pay | Admitting: Nurse Practitioner

## 2016-09-07 DIAGNOSIS — E119 Type 2 diabetes mellitus without complications: Secondary | ICD-10-CM

## 2016-09-07 DIAGNOSIS — E1069 Type 1 diabetes mellitus with other specified complication: Secondary | ICD-10-CM

## 2016-09-07 DIAGNOSIS — E785 Hyperlipidemia, unspecified: Secondary | ICD-10-CM

## 2016-09-07 DIAGNOSIS — I2510 Atherosclerotic heart disease of native coronary artery without angina pectoris: Secondary | ICD-10-CM

## 2016-09-07 DIAGNOSIS — K219 Gastro-esophageal reflux disease without esophagitis: Secondary | ICD-10-CM

## 2016-09-07 DIAGNOSIS — G4700 Insomnia, unspecified: Secondary | ICD-10-CM

## 2016-09-08 NOTE — Telephone Encounter (Signed)
LM NTBS before next refill

## 2016-09-08 NOTE — Telephone Encounter (Signed)
Last refill without being seen 

## 2016-09-21 ENCOUNTER — Other Ambulatory Visit: Payer: Self-pay | Admitting: Nurse Practitioner

## 2016-09-21 DIAGNOSIS — F329 Major depressive disorder, single episode, unspecified: Secondary | ICD-10-CM

## 2016-09-21 DIAGNOSIS — F32A Depression, unspecified: Secondary | ICD-10-CM

## 2016-10-26 ENCOUNTER — Other Ambulatory Visit: Payer: Self-pay | Admitting: Nurse Practitioner

## 2016-10-26 DIAGNOSIS — I10 Essential (primary) hypertension: Secondary | ICD-10-CM

## 2016-11-02 ENCOUNTER — Encounter (HOSPITAL_COMMUNITY): Payer: Self-pay | Admitting: Emergency Medicine

## 2016-11-02 ENCOUNTER — Emergency Department (HOSPITAL_COMMUNITY): Payer: Medicare Other

## 2016-11-02 ENCOUNTER — Inpatient Hospital Stay (HOSPITAL_COMMUNITY)
Admission: EM | Admit: 2016-11-02 | Discharge: 2016-11-09 | DRG: 871 | Disposition: A | Discharge status: Home / Self Care | Payer: Medicare Other | Attending: Family Medicine | Admitting: Family Medicine

## 2016-11-02 DIAGNOSIS — Z86010 Personal history of colonic polyps: Secondary | ICD-10-CM | POA: Diagnosis not present

## 2016-11-02 DIAGNOSIS — I429 Cardiomyopathy, unspecified: Secondary | ICD-10-CM | POA: Diagnosis not present

## 2016-11-02 DIAGNOSIS — Z833 Family history of diabetes mellitus: Secondary | ICD-10-CM

## 2016-11-02 DIAGNOSIS — K219 Gastro-esophageal reflux disease without esophagitis: Secondary | ICD-10-CM | POA: Diagnosis present

## 2016-11-02 DIAGNOSIS — R6520 Severe sepsis without septic shock: Secondary | ICD-10-CM | POA: Diagnosis present

## 2016-11-02 DIAGNOSIS — R451 Restlessness and agitation: Secondary | ICD-10-CM | POA: Diagnosis not present

## 2016-11-02 DIAGNOSIS — R0602 Shortness of breath: Secondary | ICD-10-CM | POA: Diagnosis not present

## 2016-11-02 DIAGNOSIS — Z825 Family history of asthma and other chronic lower respiratory diseases: Secondary | ICD-10-CM | POA: Diagnosis not present

## 2016-11-02 DIAGNOSIS — I10 Essential (primary) hypertension: Secondary | ICD-10-CM | POA: Diagnosis present

## 2016-11-02 DIAGNOSIS — Z86718 Personal history of other venous thrombosis and embolism: Secondary | ICD-10-CM

## 2016-11-02 DIAGNOSIS — M542 Cervicalgia: Secondary | ICD-10-CM

## 2016-11-02 DIAGNOSIS — F17210 Nicotine dependence, cigarettes, uncomplicated: Secondary | ICD-10-CM | POA: Diagnosis present

## 2016-11-02 DIAGNOSIS — R55 Syncope and collapse: Secondary | ICD-10-CM

## 2016-11-02 DIAGNOSIS — I739 Peripheral vascular disease, unspecified: Secondary | ICD-10-CM | POA: Diagnosis not present

## 2016-11-02 DIAGNOSIS — I248 Other forms of acute ischemic heart disease: Secondary | ICD-10-CM | POA: Diagnosis present

## 2016-11-02 DIAGNOSIS — I2510 Atherosclerotic heart disease of native coronary artery without angina pectoris: Secondary | ICD-10-CM | POA: Diagnosis not present

## 2016-11-02 DIAGNOSIS — Z8673 Personal history of transient ischemic attack (TIA), and cerebral infarction without residual deficits: Secondary | ICD-10-CM | POA: Diagnosis not present

## 2016-11-02 DIAGNOSIS — Z801 Family history of malignant neoplasm of trachea, bronchus and lung: Secondary | ICD-10-CM

## 2016-11-02 DIAGNOSIS — E860 Dehydration: Secondary | ICD-10-CM | POA: Diagnosis present

## 2016-11-02 DIAGNOSIS — R7989 Other specified abnormal findings of blood chemistry: Secondary | ICD-10-CM

## 2016-11-02 DIAGNOSIS — J189 Pneumonia, unspecified organism: Secondary | ICD-10-CM

## 2016-11-02 DIAGNOSIS — I5020 Unspecified systolic (congestive) heart failure: Secondary | ICD-10-CM | POA: Diagnosis present

## 2016-11-02 DIAGNOSIS — E1159 Type 2 diabetes mellitus with other circulatory complications: Secondary | ICD-10-CM | POA: Diagnosis present

## 2016-11-02 DIAGNOSIS — Z9889 Other specified postprocedural states: Secondary | ICD-10-CM | POA: Diagnosis not present

## 2016-11-02 DIAGNOSIS — Z7984 Long term (current) use of oral hypoglycemic drugs: Secondary | ICD-10-CM

## 2016-11-02 DIAGNOSIS — J449 Chronic obstructive pulmonary disease, unspecified: Secondary | ICD-10-CM | POA: Diagnosis present

## 2016-11-02 DIAGNOSIS — M109 Gout, unspecified: Secondary | ICD-10-CM | POA: Diagnosis present

## 2016-11-02 DIAGNOSIS — I25119 Atherosclerotic heart disease of native coronary artery with unspecified angina pectoris: Secondary | ICD-10-CM | POA: Diagnosis not present

## 2016-11-02 DIAGNOSIS — A419 Sepsis, unspecified organism: Secondary | ICD-10-CM | POA: Diagnosis not present

## 2016-11-02 DIAGNOSIS — R748 Abnormal levels of other serum enzymes: Secondary | ICD-10-CM | POA: Diagnosis not present

## 2016-11-02 DIAGNOSIS — Z951 Presence of aortocoronary bypass graft: Secondary | ICD-10-CM

## 2016-11-02 DIAGNOSIS — I422 Other hypertrophic cardiomyopathy: Secondary | ICD-10-CM

## 2016-11-02 DIAGNOSIS — R918 Other nonspecific abnormal finding of lung field: Secondary | ICD-10-CM | POA: Diagnosis not present

## 2016-11-02 DIAGNOSIS — E871 Hypo-osmolality and hyponatremia: Secondary | ICD-10-CM | POA: Diagnosis present

## 2016-11-02 DIAGNOSIS — Z888 Allergy status to other drugs, medicaments and biological substances status: Secondary | ICD-10-CM | POA: Diagnosis not present

## 2016-11-02 DIAGNOSIS — E1151 Type 2 diabetes mellitus with diabetic peripheral angiopathy without gangrene: Secondary | ICD-10-CM | POA: Diagnosis not present

## 2016-11-02 DIAGNOSIS — Z79899 Other long term (current) drug therapy: Secondary | ICD-10-CM | POA: Diagnosis not present

## 2016-11-02 DIAGNOSIS — J9601 Acute respiratory failure with hypoxia: Secondary | ICD-10-CM | POA: Diagnosis present

## 2016-11-02 DIAGNOSIS — Z23 Encounter for immunization: Secondary | ICD-10-CM

## 2016-11-02 DIAGNOSIS — Z720 Tobacco use: Secondary | ICD-10-CM

## 2016-11-02 DIAGNOSIS — D509 Iron deficiency anemia, unspecified: Secondary | ICD-10-CM | POA: Diagnosis present

## 2016-11-02 DIAGNOSIS — R9389 Abnormal findings on diagnostic imaging of other specified body structures: Secondary | ICD-10-CM

## 2016-11-02 DIAGNOSIS — S199XXA Unspecified injury of neck, initial encounter: Secondary | ICD-10-CM | POA: Diagnosis not present

## 2016-11-02 DIAGNOSIS — R062 Wheezing: Secondary | ICD-10-CM

## 2016-11-02 DIAGNOSIS — R590 Localized enlarged lymph nodes: Secondary | ICD-10-CM | POA: Diagnosis present

## 2016-11-02 DIAGNOSIS — I110 Hypertensive heart disease with heart failure: Secondary | ICD-10-CM | POA: Diagnosis present

## 2016-11-02 DIAGNOSIS — Z8249 Family history of ischemic heart disease and other diseases of the circulatory system: Secondary | ICD-10-CM | POA: Diagnosis not present

## 2016-11-02 DIAGNOSIS — R05 Cough: Secondary | ICD-10-CM | POA: Diagnosis not present

## 2016-11-02 DIAGNOSIS — M4802 Spinal stenosis, cervical region: Secondary | ICD-10-CM | POA: Diagnosis present

## 2016-11-02 DIAGNOSIS — T07XXXA Unspecified multiple injuries, initial encounter: Secondary | ICD-10-CM | POA: Diagnosis not present

## 2016-11-02 DIAGNOSIS — J181 Lobar pneumonia, unspecified organism: Secondary | ICD-10-CM | POA: Diagnosis not present

## 2016-11-02 DIAGNOSIS — R778 Other specified abnormalities of plasma proteins: Secondary | ICD-10-CM

## 2016-11-02 LAB — BASIC METABOLIC PANEL
ANION GAP: 13 (ref 5–15)
BUN: 5 mg/dL — ABNORMAL LOW (ref 6–20)
CALCIUM: 9.1 mg/dL (ref 8.9–10.3)
CO2: 19 mmol/L — ABNORMAL LOW (ref 22–32)
CREATININE: 0.89 mg/dL (ref 0.61–1.24)
Chloride: 93 mmol/L — ABNORMAL LOW (ref 101–111)
Glucose, Bld: 227 mg/dL — ABNORMAL HIGH (ref 65–99)
Potassium: 4.4 mmol/L (ref 3.5–5.1)
Sodium: 125 mmol/L — ABNORMAL LOW (ref 135–145)

## 2016-11-02 LAB — CBC
HCT: 31.7 % — ABNORMAL LOW (ref 39.0–52.0)
HEMOGLOBIN: 10.5 g/dL — AB (ref 13.0–17.0)
MCH: 22.9 pg — ABNORMAL LOW (ref 26.0–34.0)
MCHC: 33.1 g/dL (ref 30.0–36.0)
MCV: 69.1 fL — ABNORMAL LOW (ref 78.0–100.0)
PLATELETS: 254 10*3/uL (ref 150–400)
RBC: 4.59 MIL/uL (ref 4.22–5.81)
RDW: 15.9 % — ABNORMAL HIGH (ref 11.5–15.5)
WBC: 17.1 10*3/uL — ABNORMAL HIGH (ref 4.0–10.5)

## 2016-11-02 LAB — URINALYSIS, ROUTINE W REFLEX MICROSCOPIC
BILIRUBIN URINE: NEGATIVE
Bacteria, UA: NONE SEEN
GLUCOSE, UA: 150 mg/dL — AB
HGB URINE DIPSTICK: NEGATIVE
Ketones, ur: 5 mg/dL — AB
Leukocytes, UA: NEGATIVE
NITRITE: NEGATIVE
Protein, ur: 100 mg/dL — AB
SPECIFIC GRAVITY, URINE: 1.025 (ref 1.005–1.030)
Squamous Epithelial / LPF: NONE SEEN
pH: 5 (ref 5.0–8.0)

## 2016-11-02 LAB — I-STAT TROPONIN, ED
TROPONIN I, POC: 0.25 ng/mL — AB (ref 0.00–0.08)
Troponin i, poc: 1.19 ng/mL (ref 0.00–0.08)

## 2016-11-02 LAB — TROPONIN I: Troponin I: 1.74 ng/mL (ref <0.03)

## 2016-11-02 LAB — BRAIN NATRIURETIC PEPTIDE: B NATRIURETIC PEPTIDE 5: 19.1 pg/mL (ref 0.0–100.0)

## 2016-11-02 LAB — PROTIME-INR
INR: 1.13
PROTHROMBIN TIME: 14.6 s (ref 11.4–15.2)

## 2016-11-02 LAB — TSH: TSH: 0.726 u[IU]/mL (ref 0.350–4.500)

## 2016-11-02 LAB — I-STAT CG4 LACTIC ACID, ED
Lactic Acid, Venous: 4.22 mmol/L (ref 0.5–1.9)
Lactic Acid, Venous: 1.69 mmol/L (ref 0.5–1.9)

## 2016-11-02 LAB — LACTIC ACID, PLASMA: Lactic Acid, Venous: 2.9 mmol/L (ref 0.5–1.9)

## 2016-11-02 LAB — D-DIMER, QUANTITATIVE: D-Dimer, Quant: 8.18 ug/mL-FEU — ABNORMAL HIGH (ref 0.00–0.50)

## 2016-11-02 LAB — CBG MONITORING, ED: GLUCOSE-CAPILLARY: 238 mg/dL — AB (ref 65–99)

## 2016-11-02 LAB — MAGNESIUM: Magnesium: 1.1 mg/dL — ABNORMAL LOW (ref 1.7–2.4)

## 2016-11-02 MED ORDER — SODIUM CHLORIDE 0.9 % IV BOLUS (SEPSIS)
500.0000 mL | Freq: Once | INTRAVENOUS | Status: AC
  Administered 2016-11-02: 500 mL via INTRAVENOUS

## 2016-11-02 MED ORDER — ACETAMINOPHEN 325 MG PO TABS
650.0000 mg | ORAL_TABLET | Freq: Four times a day (QID) | ORAL | Status: DC | PRN
Start: 2016-11-02 — End: 2016-11-09
  Administered 2016-11-03: 650 mg via ORAL
  Filled 2016-11-02: qty 2

## 2016-11-02 MED ORDER — DEXTROSE 5 % IV SOLN
500.0000 mg | INTRAVENOUS | Status: DC
  Administered 2016-11-02 – 2016-11-05 (×4): 500 mg via INTRAVENOUS
  Filled 2016-11-02 (×6): qty 500

## 2016-11-02 MED ORDER — MIRTAZAPINE 30 MG PO TABS
30.0000 mg | ORAL_TABLET | Freq: Every morning | ORAL | Status: DC
  Administered 2016-11-03 – 2016-11-09 (×7): 30 mg via ORAL
  Filled 2016-11-02: qty 2
  Filled 2016-11-02 (×3): qty 1
  Filled 2016-11-02: qty 2
  Filled 2016-11-02: qty 1
  Filled 2016-11-02 (×2): qty 2

## 2016-11-02 MED ORDER — PANTOPRAZOLE SODIUM 40 MG PO TBEC
40.0000 mg | DELAYED_RELEASE_TABLET | Freq: Every day | ORAL | Status: DC
  Administered 2016-11-03 – 2016-11-09 (×7): 40 mg via ORAL
  Filled 2016-11-02 (×7): qty 1

## 2016-11-02 MED ORDER — ONDANSETRON HCL 4 MG PO TABS
4.0000 mg | ORAL_TABLET | Freq: Four times a day (QID) | ORAL | Status: DC | PRN
  Administered 2016-11-03: 4 mg via ORAL
  Filled 2016-11-02: qty 1

## 2016-11-02 MED ORDER — SODIUM CHLORIDE 0.9 % IV SOLN
INTRAVENOUS | Status: AC
  Administered 2016-11-02 – 2016-11-03 (×2): via INTRAVENOUS

## 2016-11-02 MED ORDER — IOPAMIDOL (ISOVUE-370) INJECTION 76%
INTRAVENOUS | Status: AC
Start: 2016-11-02 — End: 2016-11-02
  Administered 2016-11-02: 100 mL
  Filled 2016-11-02: qty 100

## 2016-11-02 MED ORDER — CITALOPRAM HYDROBROMIDE 20 MG PO TABS
20.0000 mg | ORAL_TABLET | Freq: Every day | ORAL | Status: DC
  Administered 2016-11-03 – 2016-11-09 (×7): 20 mg via ORAL
  Filled 2016-11-02 (×7): qty 1

## 2016-11-02 MED ORDER — ATORVASTATIN CALCIUM 40 MG PO TABS: 40.0000 mg | ORAL_TABLET | Freq: Every day | ORAL | Status: DC

## 2016-11-02 MED ORDER — CILOSTAZOL 100 MG PO TABS
100.0000 mg | ORAL_TABLET | Freq: Two times a day (BID) | ORAL | Status: DC
Start: 2016-11-02 — End: 2016-11-09
  Administered 2016-11-02 – 2016-11-09 (×14): 100 mg via ORAL
  Filled 2016-11-02 (×15): qty 1

## 2016-11-02 MED ORDER — LISINOPRIL 20 MG PO TABS: 20.0000 mg | ORAL_TABLET | Freq: Every day | ORAL | Status: DC

## 2016-11-02 MED ORDER — DEXTROSE 5 % IV SOLN
500.0000 mg | Freq: Once | INTRAVENOUS | Status: AC
  Administered 2016-11-02: 500 mg via INTRAVENOUS
  Filled 2016-11-02: qty 5

## 2016-11-02 MED ORDER — DEXTROSE 5 % IV SOLN: 1.0000 g | Freq: Once | INTRAVENOUS | Status: DC

## 2016-11-02 MED ORDER — ONDANSETRON HCL 4 MG/2ML IJ SOLN: 4.0000 mg | Freq: Four times a day (QID) | INTRAMUSCULAR | Status: DC | PRN

## 2016-11-02 MED ORDER — ENOXAPARIN SODIUM 40 MG/0.4ML ~~LOC~~ SOLN: 40.0000 mg | SUBCUTANEOUS | Status: DC

## 2016-11-02 MED ORDER — DEXTROSE 5 % IV SOLN: 500.0000 mg | Freq: Once | INTRAVENOUS | Status: DC

## 2016-11-02 MED ORDER — INFLUENZA VAC SPLIT QUAD 0.5 ML IM SUSY
0.5000 mL | PREFILLED_SYRINGE | INTRAMUSCULAR | Status: AC
  Administered 2016-11-03: 0.5 mL via INTRAMUSCULAR
  Filled 2016-11-02: qty 0.5

## 2016-11-02 MED ORDER — DEXTROSE 5 % IV SOLN
1.0000 g | INTRAVENOUS | Status: DC
  Administered 2016-11-02 – 2016-11-03 (×2): 1 g via INTRAVENOUS
  Filled 2016-11-02 (×3): qty 10

## 2016-11-02 MED ORDER — FAMOTIDINE 20 MG PO TABS
20.0000 mg | ORAL_TABLET | Freq: Two times a day (BID) | ORAL | Status: DC
  Administered 2016-11-02 – 2016-11-09 (×14): 20 mg via ORAL
  Filled 2016-11-02 (×13): qty 1

## 2016-11-02 MED ORDER — ACETAMINOPHEN 650 MG RE SUPP: 650.0000 mg | Freq: Four times a day (QID) | RECTAL | Status: DC | PRN

## 2016-11-02 MED ORDER — IOPAMIDOL (ISOVUE-300) INJECTION 61%
INTRAVENOUS | Status: AC
  Filled 2016-11-02: qty 30

## 2016-11-02 MED ORDER — SODIUM CHLORIDE 0.9 % IV BOLUS (SEPSIS)
1000.0000 mL | Freq: Once | INTRAVENOUS | Status: AC
  Administered 2016-11-02: 1000 mL via INTRAVENOUS

## 2016-11-02 MED ORDER — CLOPIDOGREL BISULFATE 75 MG PO TABS: 75.0000 mg | ORAL_TABLET | Freq: Every day | ORAL | Status: DC

## 2016-11-02 MED ORDER — INSULIN ASPART 100 UNIT/ML ~~LOC~~ SOLN
0.0000 [IU] | Freq: Three times a day (TID) | SUBCUTANEOUS | Status: DC
  Administered 2016-11-03: 2 [IU] via SUBCUTANEOUS
  Administered 2016-11-03: 3 [IU] via SUBCUTANEOUS
  Administered 2016-11-03: 2 [IU] via SUBCUTANEOUS
  Administered 2016-11-04: 3 [IU] via SUBCUTANEOUS
  Administered 2016-11-04 – 2016-11-05 (×3): 2 [IU] via SUBCUTANEOUS
  Administered 2016-11-05: 6 [IU] via SUBCUTANEOUS
  Administered 2016-11-05: 5 [IU] via SUBCUTANEOUS
  Administered 2016-11-05: 2 [IU] via SUBCUTANEOUS
  Administered 2016-11-06: 3 [IU] via SUBCUTANEOUS
  Administered 2016-11-06: 5 [IU] via SUBCUTANEOUS
  Administered 2016-11-06: 1 [IU] via SUBCUTANEOUS
  Administered 2016-11-07 (×2): 2 [IU] via SUBCUTANEOUS
  Administered 2016-11-07: 1 [IU] via SUBCUTANEOUS
  Administered 2016-11-08: 2 [IU] via SUBCUTANEOUS
  Administered 2016-11-08 – 2016-11-09 (×2): 1 [IU] via SUBCUTANEOUS
  Administered 2016-11-09: 2 [IU] via SUBCUTANEOUS

## 2016-11-02 MED ORDER — DEXTROSE 5 % IV SOLN: 500.0000 mg | INTRAVENOUS | Status: DC

## 2016-11-02 MED ORDER — NICOTINE 14 MG/24HR TD PT24
14.0000 mg | MEDICATED_PATCH | Freq: Every day | TRANSDERMAL | Status: DC
  Administered 2016-11-02 – 2016-11-09 (×8): 14 mg via TRANSDERMAL
  Filled 2016-11-02 (×8): qty 1

## 2016-11-02 MED ORDER — DEXTROSE 5 % IV SOLN: 1.0000 g | INTRAVENOUS | Status: DC

## 2016-11-02 NOTE — ED Notes (Signed)
EDP notified of patient having hypoxia.  Placed patient on 4L of O2 at this time. Was asked by EDP to order chest xray.

## 2016-11-02 NOTE — Progress Notes (Signed)
Pharmacy Antibiotic Note  Perry Kennedy is a 75 y.o. male admitted on 11/02/2016 with pneumonia.  Pharmacy has been consulted for ceftriaxone/azithromycin dosing. Afebrile, WBC 17.1.  Plan: Ceftriaxone 1g IV q24h Azithromycin 500mg  IV q24h Monitor clinical progress, LOT Not renally adjusted - Rx will s/o consult  Height: 5\' 10"  (177.8 cm) Weight: 140 lb (63.5 kg) IBW/kg (Calculated) : 73  Temp (24hrs), Avg:97.5 F (36.4 C), Min:97.5 F (36.4 C), Max:97.5 F (36.4 C)   Recent Labs Lab 11/02/16 1552 11/02/16 1650  WBC 17.1*  --   LATICACIDVEN  --  4.22*    CrCl cannot be calculated (Patient's most recent lab result is older than the maximum 21 days allowed.).    Allergies  Allergen Reactions  . Morphine And Related Other (See Comments)    "goes crazy"    Elicia Lamp, PharmD, BCPS Clinical Pharmacist 11/02/2016 5:15 PM

## 2016-11-02 NOTE — ED Notes (Signed)
Patient on way to Xray

## 2016-11-02 NOTE — ED Notes (Signed)
Gave pt a urinal, pt is aware about urine sample.
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
## 2016-11-02 NOTE — ED Notes (Signed)
Gave patient [place] sandwich and sprite.
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
## 2016-11-02 NOTE — ED Notes (Signed)
EDP notified of placing patient on 2L of O2. O2 sats dropping to 88% on RA.

## 2016-11-03 ENCOUNTER — Inpatient Hospital Stay (HOSPITAL_COMMUNITY): Payer: Medicare Other

## 2016-11-03 ENCOUNTER — Encounter (HOSPITAL_COMMUNITY): Payer: Self-pay | Admitting: Student

## 2016-11-03 DIAGNOSIS — R55 Syncope and collapse: Secondary | ICD-10-CM

## 2016-11-03 DIAGNOSIS — R748 Abnormal levels of other serum enzymes: Secondary | ICD-10-CM

## 2016-11-03 DIAGNOSIS — I2510 Atherosclerotic heart disease of native coronary artery without angina pectoris: Secondary | ICD-10-CM

## 2016-11-03 DIAGNOSIS — I10 Essential (primary) hypertension: Secondary | ICD-10-CM

## 2016-11-03 DIAGNOSIS — R7989 Other specified abnormal findings of blood chemistry: Secondary | ICD-10-CM

## 2016-11-03 DIAGNOSIS — I420 Dilated cardiomyopathy: Secondary | ICD-10-CM

## 2016-11-03 DIAGNOSIS — I422 Other hypertrophic cardiomyopathy: Secondary | ICD-10-CM

## 2016-11-03 DIAGNOSIS — I248 Other forms of acute ischemic heart disease: Secondary | ICD-10-CM

## 2016-11-03 DIAGNOSIS — I429 Cardiomyopathy, unspecified: Secondary | ICD-10-CM

## 2016-11-03 DIAGNOSIS — R778 Other specified abnormalities of plasma proteins: Secondary | ICD-10-CM

## 2016-11-03 DIAGNOSIS — I739 Peripheral vascular disease, unspecified: Secondary | ICD-10-CM

## 2016-11-03 LAB — CBC WITH DIFFERENTIAL/PLATELET
BASOS ABS: 0 10*3/uL (ref 0.0–0.1)
BASOS PCT: 0 %
EOS ABS: 0.1 10*3/uL (ref 0.0–0.7)
Eosinophils Relative: 1 %
HCT: 27 % — ABNORMAL LOW (ref 39.0–52.0)
HEMOGLOBIN: 8.7 g/dL — AB (ref 13.0–17.0)
Lymphocytes Relative: 17 %
Lymphs Abs: 1.4 10*3/uL (ref 0.7–4.0)
MCH: 22.7 pg — ABNORMAL LOW (ref 26.0–34.0)
MCHC: 32.2 g/dL (ref 30.0–36.0)
MCV: 70.3 fL — ABNORMAL LOW (ref 78.0–100.0)
MONOS PCT: 5 %
Monocytes Absolute: 0.4 10*3/uL (ref 0.1–1.0)
NEUTROS PCT: 77 %
Neutro Abs: 6.3 10*3/uL (ref 1.7–7.7)
Platelets: 230 10*3/uL (ref 150–400)
RBC: 3.84 MIL/uL — ABNORMAL LOW (ref 4.22–5.81)
RDW: 16.6 % — AB (ref 11.5–15.5)
WBC: 8.1 10*3/uL (ref 4.0–10.5)

## 2016-11-03 LAB — LACTIC ACID, PLASMA
LACTIC ACID, VENOUS: 1.4 mmol/L (ref 0.5–1.9)
Lactic Acid, Venous: 1.7 mmol/L (ref 0.5–1.9)

## 2016-11-03 LAB — ECHOCARDIOGRAM COMPLETE
Height: 70 in
WEIGHTICAEL: 2265.6 [oz_av]

## 2016-11-03 LAB — COMPREHENSIVE METABOLIC PANEL
ALBUMIN: 2.9 g/dL — AB (ref 3.5–5.0)
ALT: 17 U/L (ref 17–63)
ANION GAP: 11 (ref 5–15)
AST: 27 U/L (ref 15–41)
Alkaline Phosphatase: 89 U/L (ref 38–126)
BUN: <5 mg/dL — ABNORMAL LOW (ref 6–20)
CO2: 17 mmol/L — AB (ref 22–32)
Calcium: 7.8 mg/dL — ABNORMAL LOW (ref 8.9–10.3)
Chloride: 100 mmol/L — ABNORMAL LOW (ref 101–111)
Creatinine, Ser: 0.87 mg/dL (ref 0.61–1.24)
GFR calc Af Amer: >60 mL/min (ref >60)
GFR calc non Af Amer: >60 mL/min (ref >60)
Glucose, Bld: 149 mg/dL — ABNORMAL HIGH (ref 65–99)
POTASSIUM: 4.2 mmol/L (ref 3.5–5.1)
SODIUM: 128 mmol/L — AB (ref 135–145)
Total Bilirubin: 0.4 mg/dL (ref 0.3–1.2)
Total Protein: 5.7 g/dL — ABNORMAL LOW (ref 6.5–8.1)

## 2016-11-03 LAB — VITAMIN B12: Vitamin B-12: 1361 pg/mL — ABNORMAL HIGH (ref 180–914)

## 2016-11-03 LAB — GLUCOSE, CAPILLARY
Glucose-Capillary: 158 mg/dL — ABNORMAL HIGH (ref 65–99)
Glucose-Capillary: 180 mg/dL — ABNORMAL HIGH (ref 65–99)
Glucose-Capillary: 202 mg/dL — ABNORMAL HIGH (ref 65–99)
Glucose-Capillary: 213 mg/dL — ABNORMAL HIGH (ref 65–99)

## 2016-11-03 LAB — FOLATE: Folate: 29.9 ng/mL (ref >5.9)

## 2016-11-03 LAB — HIV ANTIBODY (ROUTINE TESTING W REFLEX): HIV SCREEN 4TH GENERATION: NONREACTIVE

## 2016-11-03 LAB — IRON AND TIBC
IRON: 14 ug/dL — AB (ref 45–182)
SATURATION RATIOS: 4 % — AB (ref 17.9–39.5)
TIBC: 381 ug/dL (ref 250–450)
UIBC: 367 ug/dL

## 2016-11-03 LAB — INFLUENZA PANEL BY PCR (TYPE A & B)
INFLBPCR: NEGATIVE
Influenza A By PCR: NEGATIVE

## 2016-11-03 LAB — STREP PNEUMONIAE URINARY ANTIGEN: STREP PNEUMO URINARY ANTIGEN: NEGATIVE

## 2016-11-03 LAB — TROPONIN I
TROPONIN I: 1.04 ng/mL — AB (ref <0.03)
Troponin I: 1.7 ng/mL (ref <0.03)

## 2016-11-03 LAB — PROCALCITONIN: Procalcitonin: 0.19 ng/mL

## 2016-11-03 LAB — FERRITIN: FERRITIN: 37 ng/mL (ref 24–336)

## 2016-11-03 LAB — RETICULOCYTES
RBC.: 4.06 MIL/uL — ABNORMAL LOW (ref 4.22–5.81)
Retic Count, Absolute: 36.5 10*3/uL (ref 19.0–186.0)
Retic Ct Pct: 0.9 % (ref 0.4–3.1)

## 2016-11-03 LAB — HEPARIN LEVEL (UNFRACTIONATED)
Heparin Unfractionated: 0.26 IU/mL — ABNORMAL LOW (ref 0.30–0.70)
Heparin Unfractionated: 0.33 IU/mL (ref 0.30–0.70)

## 2016-11-03 MED ORDER — ORAL CARE MOUTH RINSE
15.0000 mL | Freq: Two times a day (BID) | OROMUCOSAL | Status: DC
  Administered 2016-11-03 – 2016-11-09 (×9): 15 mL via OROMUCOSAL

## 2016-11-03 MED ORDER — LISINOPRIL 20 MG PO TABS
20.0000 mg | ORAL_TABLET | Freq: Every day | ORAL | Status: DC
  Filled 2016-11-03: qty 1

## 2016-11-03 MED ORDER — MAGNESIUM SULFATE 4 GM/100ML IV SOLN
4.0000 g | Freq: Once | INTRAVENOUS | Status: AC
  Administered 2016-11-03: 4 g via INTRAVENOUS
  Filled 2016-11-03: qty 100

## 2016-11-03 MED ORDER — CLOPIDOGREL BISULFATE 75 MG PO TABS
75.0000 mg | ORAL_TABLET | Freq: Every day | ORAL | Status: DC
  Administered 2016-11-03 – 2016-11-09 (×7): 75 mg via ORAL
  Filled 2016-11-03 (×7): qty 1

## 2016-11-03 MED ORDER — HEPARIN (PORCINE) IN NACL 100-0.45 UNIT/ML-% IJ SOLN
1000.0000 [IU]/h | INTRAMUSCULAR | Status: DC
  Administered 2016-11-03: 1000 [IU]/h via INTRAVENOUS
  Filled 2016-11-03: qty 250

## 2016-11-03 MED ORDER — SODIUM CHLORIDE 0.9 % IV BOLUS (SEPSIS)
500.0000 mL | Freq: Once | INTRAVENOUS | Status: AC
  Administered 2016-11-03: 500 mL via INTRAVENOUS

## 2016-11-03 MED ORDER — METOPROLOL TARTRATE 12.5 MG HALF TABLET
12.5000 mg | ORAL_TABLET | Freq: Two times a day (BID) | ORAL | Status: DC
  Administered 2016-11-03 (×3): 12.5 mg via ORAL
  Filled 2016-11-03 (×4): qty 1

## 2016-11-03 MED ORDER — SODIUM CHLORIDE 0.9 % IV BOLUS (SEPSIS)
500.0000 mL | Freq: Once | INTRAVENOUS | Status: AC
  Administered 2016-11-03 (×2): 500 mL via INTRAVENOUS

## 2016-11-03 MED ORDER — HEPARIN BOLUS VIA INFUSION
3500.0000 [IU] | Freq: Once | INTRAVENOUS | Status: AC
  Administered 2016-11-03: 3500 [IU] via INTRAVENOUS
  Filled 2016-11-03: qty 3500

## 2016-11-03 MED ORDER — ALBUTEROL SULFATE (2.5 MG/3ML) 0.083% IN NEBU
2.5000 mg | INHALATION_SOLUTION | RESPIRATORY_TRACT | Status: DC | PRN
  Administered 2016-11-03 – 2016-11-05 (×3): 2.5 mg via RESPIRATORY_TRACT
  Filled 2016-11-03 (×4): qty 3

## 2016-11-03 MED ORDER — ATORVASTATIN CALCIUM 80 MG PO TABS
80.0000 mg | ORAL_TABLET | Freq: Every day | ORAL | Status: DC
Start: 2016-11-03 — End: 2016-11-09
  Administered 2016-11-03 – 2016-11-08 (×6): 80 mg via ORAL
  Filled 2016-11-03 (×6): qty 1

## 2016-11-03 MED ORDER — ATORVASTATIN CALCIUM 40 MG PO TABS
40.0000 mg | ORAL_TABLET | Freq: Once | ORAL | Status: AC
  Administered 2016-11-03: 40 mg via ORAL
  Filled 2016-11-03: qty 1

## 2016-11-03 MED ORDER — HEPARIN (PORCINE) IN NACL 100-0.45 UNIT/ML-% IJ SOLN
950.0000 [IU]/h | INTRAMUSCULAR | Status: DC
Start: 2016-11-03 — End: 2016-11-03
  Administered 2016-11-03: 800 [IU]/h via INTRAVENOUS
  Filled 2016-11-03: qty 250

## 2016-11-03 NOTE — Progress Notes (Signed)
PT Cancellation Note  Patient Details Name: ZAHMARI MILKEY MRN: [id number] DOB: March 04, 1941   Cancelled Treatment:    Reason Eval/Treat Not Completed: Patient at procedure or test/unavailable   Rexanne Mano 11/03/2016, 9:51 AM Pager [phone]
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
## 2016-11-03 NOTE — Procedures (Signed)
HPI:  75 y/o with syncope  TECHNICAL SUMMARY:  A multichannel referential and bipolar montage EEG using the standard international 10-20 system was performed on the patient described as awake and drowsy.  The dominant background activity consists of 8.5-9 hertz activity seen most prominantly over the posterior head region.  The backgound activity is reactive to eye opening and closing procedures.  Low voltage fast (beta) activity is distributed symmetrically and maximally over the anterior head regions.  ACTIVATION:  Stepwise photic stimulation at 4-20 flashes per second was performed and did not elicit any abnormal waveforms but did produce a symmetric driving response.  Hyperventilation was not performed  EPILEPTIFORM ACTIVITY:  There were no spikes, sharp waves or paroxysmal activity.  SLEEP:  Physiologic drowsiness was noted, but no stage II sleep  CARDIAC:  The EKG lead revealed a regular rhythm.  IMPRESSION:  This is a normal EEG for the patients stated age.  There were no focal, hemispheric or lateralizing features.  No epileptiform activity was recorded.  A normal EEG does not exclude the diagnosis of a seizure disorder and if seizure remains high on the list of differential diagnosis, an ambulatory EEG may be of value.  Clinical correlation is required.

## 2016-11-03 NOTE — Progress Notes (Signed)
[place] for Heparin Indication: chest pain/ACS  Allergies  Allergen Reactions  . Morphine And Related Other (See Comments)    "goes crazy"    Patient Measurements: Height: 5\' 10"  (177.8 cm) Weight: 141 lb 9.6 oz (64.2 kg) IBW/kg (Calculated) : 73 Heparin Dosing Weight: 64 kg  Vital Signs: Temp: 98.2 F (36.8 C) (12/14 0544) Temp Source: Oral (12/14 0544) BP: 97/60 (12/14 0544) Pulse Rate: 87 (12/14 0544)  Labs:  Recent Labs  11/02/16 1552 11/02/16 1651 11/02/16 2236 11/03/16 0417 11/03/16 0941 11/03/16 1102  HGB 10.5*  --   --  8.7*  --   --   HCT 31.7*  --   --  27.0*  --   --   PLT 254  --   --  230  --   --   LABPROT  --  14.6  --   --   --   --   INR  --  1.13  --   --   --   --   HEPARINUNFRC  --   --   --   --   --  0.26*  CREATININE 0.89  --   --  0.87  --   --   TROPONINI  --   --  1.Perry* 1.70* 1.04*  --     Estimated Creatinine Clearance: 67.6 mL/min (by C-G formula based on SCr of 0.87 mg/dL).   Medical History: Past Medical History:  Diagnosis Date  . AAA (abdominal aortic aneurysm) ([place])   . Cataract   . Cecal ulcer   . Colon polyps    adenomatous  . COPD (chronic obstructive pulmonary disease) ([place])   . Coronary artery disease    December 2014 left main 50% stenosis, LAD 40% stenosis, first diagonal 70% stenosis, ramus intermediate 60% stenosis, right coronary artery 25% stenosis.  . Depression   . Diabetes mellitus without complication ([place])   . Diverticulosis   . DVT (deep venous thrombosis) ([place])   . GERD (gastroesophageal reflux disease)   . Gout   . Headache(784.0)    migranes- not in a few years  . Hemorrhoids   . Hyperlipidemia   . Hypertension   . Insomnia   . Peripheral vascular disease ([place])     Medications:  Prescriptions Prior to Admission  Medication Sig Dispense Refill Last Dose  . atorvastatin (LIPITOR) 40 MG tablet TAKE ONE TABLET BY MOUTH ONCE DAILY 90 tablet 0 11/02/2016  at Unknown time  . cilostazol (PLETAL) 100 MG tablet TAKE ONE TABLET BY MOUTH TWICE DAILY 180 tablet 0 11/02/2016 at Unknown time  . citalopram (CELEXA) 20 MG tablet TAKE ONE TABLET BY MOUTH ONCE DAILY 90 tablet 0 11/02/2016 at Unknown time  . clopidogrel (PLAVIX) 75 MG tablet TAKE ONE TABLET BY MOUTH ONCE DAILY 90 tablet 1 11/02/2016 at Unknown time  . lisinopril (PRINIVIL,ZESTRIL) 20 MG tablet TAKE ONE TABLET BY MOUTH ONCE DAILY 30 tablet 0 11/02/2016 at Unknown time  . metFORMIN (GLUCOPHAGE) 500 MG tablet TAKE ONE TABLET BY MOUTH TWICE DAILY WITH MEALS 180 tablet 0 11/02/2016 at Unknown time  . mirtazapine (REMERON) 30 MG tablet TAKE ONE TABLET BY MOUTH ONCE DAILY WITH BREAKFAST 90 tablet 0 11/02/2016 at Unknown time  . omeprazole (PRILOSEC) 20 MG capsule TAKE ONE CAPSULE BY MOUTH ONCE DAILY 90 capsule 0 11/02/2016 at Unknown time  . ranitidine (ZANTAC) 150 MG tablet Take 1 tablet (150 mg total) by mouth 2 (two) times daily. [place]  tablet 1 11/02/2016 at Unknown time    Assessment: Perry Kennedy on heparin for ongoing ACS workup. Troponin peaked at 1.Perry and now trending down. Heparin level is subtherapeutic at 0.26, CBC stable but slightly lower than on admission.   Goal of Therapy:  Heparin level 0.3-0.7 units/ml Monitor platelets by anticoagulation protocol: Yes   Plan:  1. Increase heparin infusion to 950 units/hr 2. Will f/u heparin level in 8 hours 3. Daily heparin level and CBC 4. F/u cardiology plans   Vincenza Hews, PharmD, BCPS 11/03/2016, 12:24 PM Pager: [phone]

## 2016-11-03 NOTE — Progress Notes (Signed)
  Echocardiogram 2D Echocardiogram has been performed.  Perry Kennedy 11/03/2016, 10:50 AM
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
## 2016-11-03 NOTE — Progress Notes (Signed)
Pt c/o upper/clavicular soreness/pain. Pain 5/5. + nausea but has had that all day. No diaphoresis. VSS. Pt here with syncope, elevated troponin in face of known CAD. Cardiology is on board and cardiac cath is planned for am. Pt is on Heparin drip, BB, and Statin. The pain is reproducible with palpation. Doubt cardiac pain, but is already on protective ACS therapy. RN states pt is wheezing some. Nebs ordered. Will see if he has had a recent CXR and get one if not. Troponins have been cycled and have decreased. RN to get EKG to see if any acute changes. Will check that when done. KJKG, NP [facility]
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
## 2016-11-03 NOTE — Progress Notes (Signed)
Routine EEG completed, results pending. 
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
## 2016-11-03 NOTE — Progress Notes (Signed)
Rehab Admissions Coordinator Note:  Patient was screened by Cleatrice Burke for appropriateness for an Inpatient Acute Rehab Consult per PT recommendation. Pt has [employer] which will unlikely approve an inpt rehab admission for pt's diagnosis. I will follow his progress. Recommend OT eval and SNF rehab if he fails to progress with mobility once medical workup is complete.Cleatrice Burke 11/03/2016, 4:30 PM  I can be reached at [phone].
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
## 2016-11-04 ENCOUNTER — Encounter (HOSPITAL_COMMUNITY)
Admission: EM | Disposition: A | Discharge status: Home / Self Care | Payer: Self-pay | Source: Home / Self Care | Attending: Family Medicine

## 2016-11-04 DIAGNOSIS — J181 Lobar pneumonia, unspecified organism: Secondary | ICD-10-CM

## 2016-11-04 DIAGNOSIS — A419 Sepsis, unspecified organism: Principal | ICD-10-CM

## 2016-11-04 DIAGNOSIS — R451 Restlessness and agitation: Secondary | ICD-10-CM

## 2016-11-04 DIAGNOSIS — J9601 Acute respiratory failure with hypoxia: Secondary | ICD-10-CM

## 2016-11-04 LAB — BLOOD GAS, ARTERIAL
ACID-BASE DEFICIT: 6.5 mmol/L — AB (ref 0.0–2.0)
BICARBONATE: 17.6 mmol/L — AB (ref 20.0–28.0)
DRAWN BY: 10006
FIO2: 100
O2 SAT: 87.3 %
PH ART: 7.383 (ref 7.350–7.450)
Patient temperature: 98.6
pCO2 arterial: 30.2 mmHg — ABNORMAL LOW (ref 32.0–48.0)
pO2, Arterial: 58.1 mmHg — ABNORMAL LOW (ref 83.0–108.0)

## 2016-11-04 LAB — HEPARIN LEVEL (UNFRACTIONATED): Heparin Unfractionated: 0.33 IU/mL (ref 0.30–0.70)

## 2016-11-04 LAB — GLUCOSE, CAPILLARY
GLUCOSE-CAPILLARY: 180 mg/dL — AB (ref 65–99)
GLUCOSE-CAPILLARY: 228 mg/dL — AB (ref 65–99)
GLUCOSE-CAPILLARY: 153 mg/dL — AB (ref 65–99)
Glucose-Capillary: 173 mg/dL — ABNORMAL HIGH (ref 65–99)

## 2016-11-04 LAB — CBC
HEMATOCRIT: 27.2 % — AB (ref 39.0–52.0)
Hemoglobin: 8.8 g/dL — ABNORMAL LOW (ref 13.0–17.0)
MCH: 22.9 pg — ABNORMAL LOW (ref 26.0–34.0)
MCHC: 32.4 g/dL (ref 30.0–36.0)
MCV: 70.8 fL — AB (ref 78.0–100.0)
Platelets: 248 10*3/uL (ref 150–400)
RBC: 3.84 MIL/uL — AB (ref 4.22–5.81)
RDW: 17.1 % — ABNORMAL HIGH (ref 11.5–15.5)
WBC: 15.9 10*3/uL — AB (ref 4.0–10.5)

## 2016-11-04 LAB — URINE CULTURE: CULTURE: NO GROWTH

## 2016-11-04 LAB — MRSA PCR SCREENING: MRSA by PCR: NEGATIVE

## 2016-11-04 SURGERY — LEFT HEART CATH AND CORONARY ANGIOGRAPHY
Anesthesia: LOCAL

## 2016-11-04 MED ORDER — SODIUM CHLORIDE 0.9% FLUSH: 3.0000 mL | Freq: Two times a day (BID) | INTRAVENOUS | Status: DC

## 2016-11-04 MED ORDER — HALOPERIDOL LACTATE 5 MG/ML IJ SOLN: 1.0000 mg | Freq: Every evening | INTRAMUSCULAR | Status: DC | PRN

## 2016-11-04 MED ORDER — HALOPERIDOL LACTATE 5 MG/ML IJ SOLN: 0.5000 mg | Freq: Once | INTRAMUSCULAR | Status: DC

## 2016-11-04 MED ORDER — SODIUM CHLORIDE 0.9 % IV SOLN: 250.0000 mL | INTRAVENOUS | Status: DC | PRN

## 2016-11-04 MED ORDER — PIPERACILLIN-TAZOBACTAM 3.375 G IVPB 30 MIN
3.3750 g | Freq: Once | INTRAVENOUS | Status: AC
  Administered 2016-11-04: 3.375 g via INTRAVENOUS
  Filled 2016-11-04: qty 50

## 2016-11-04 MED ORDER — PIPERACILLIN-TAZOBACTAM 3.375 G IVPB
3.3750 g | Freq: Three times a day (TID) | INTRAVENOUS | Status: DC
  Administered 2016-11-04 – 2016-11-07 (×8): 3.375 g via INTRAVENOUS
  Filled 2016-11-04 (×10): qty 50

## 2016-11-04 MED ORDER — SODIUM CHLORIDE 0.9% FLUSH: 3.0000 mL | INTRAVENOUS | Status: DC | PRN

## 2016-11-04 MED ORDER — FUROSEMIDE 10 MG/ML IJ SOLN
20.0000 mg | Freq: Once | INTRAMUSCULAR | Status: AC
  Administered 2016-11-04: 20 mg via INTRAVENOUS
  Filled 2016-11-04: qty 2

## 2016-11-04 MED ORDER — LORAZEPAM 2 MG/ML IJ SOLN: 0.5000 mg | Freq: Once | INTRAMUSCULAR | Status: DC

## 2016-11-04 MED ORDER — SODIUM CHLORIDE 0.9 % IV SOLN: INTRAVENOUS | Status: DC

## 2016-11-04 MED ORDER — ASPIRIN 81 MG PO CHEW
81.0000 mg | CHEWABLE_TABLET | ORAL | Status: DC
  Filled 2016-11-04: qty 1

## 2016-11-04 MED ORDER — VANCOMYCIN HCL IN DEXTROSE 750-5 MG/150ML-% IV SOLN
750.0000 mg | Freq: Two times a day (BID) | INTRAVENOUS | Status: DC
  Administered 2016-11-05 – 2016-11-06 (×3): 750 mg via INTRAVENOUS
  Filled 2016-11-04 (×4): qty 150

## 2016-11-04 MED ORDER — VANCOMYCIN HCL 10 G IV SOLR
1250.0000 mg | Freq: Once | INTRAVENOUS | Status: AC
  Administered 2016-11-04: 1250 mg via INTRAVENOUS
  Filled 2016-11-04: qty 1250

## 2016-11-04 NOTE — Progress Notes (Signed)
[facility] for Heparin Indication: chest pain/ACS  Allergies  Allergen Reactions  . Morphine And Related Other (See Comments)    "goes crazy"    Patient Measurements: Height: 5\' 10"  (177.8 cm) Weight: 141 lb 9.6 oz (64.2 kg) IBW/kg (Calculated) : 73 Heparin Dosing Weight: 64 kg  Vital Signs: Temp: 98.2 F (36.8 C) (12/15 0603) Temp Source: Oral (12/15 0603) BP: 93/75 (12/15 0603) Pulse Rate: 85 (12/15 0603)  Labs:  Recent Labs  11/02/16 1552 11/02/16 1651 11/02/16 2236 11/03/16 0417 11/03/16 0941 11/03/16 1102 11/03/16 2100 11/04/16 0428  HGB 10.5*  --   --  8.7*  --   --   --  8.8*  HCT 31.7*  --   --  27.0*  --   --   --  27.2*  PLT 254  --   --  230  --   --   --  248  LABPROT  --  14.6  --   --   --   --   --   --   INR  --  1.13  --   --   --   --   --   --   HEPARINUNFRC  --   --   --   --   --  0.26* 0.33 0.33  CREATININE 0.89  --   --  0.87  --   --   --   --   TROPONINI  --   --  1.74* 1.70* 1.04*  --   --   --     Estimated Creatinine Clearance: 67.6 mL/min (by C-G formula based on SCr of 0.87 mg/dL).   Medical History: Past Medical History:  Diagnosis Date  . AAA (abdominal aortic aneurysm) ([place])   . Cataract   . Cecal ulcer   . Colon polyps    adenomatous  . COPD (chronic obstructive pulmonary disease) ([place])   . Coronary artery disease    a. cath 10/2013: diffuse moderate CAD with 50% LM stenosis, 40% LAD stenosis, 70% D1, 60% LCx, and 25% RCA stenosis. Medical management with risk factor reduction was recommended at that time.   . Depression   . Diabetes mellitus without complication ([place])   . Diverticulosis   . DVT (deep venous thrombosis) ([place])   . GERD (gastroesophageal reflux disease)   . Gout   . Headache(784.0)    migranes- not in a few years  . Hemorrhoids   . Hyperlipidemia   . Hypertension   . Insomnia   . Peripheral vascular disease ([place])     Medications:  Prescriptions Prior to  Admission  Medication Sig Dispense Refill Last Dose  . atorvastatin (LIPITOR) 40 MG tablet TAKE ONE TABLET BY MOUTH ONCE DAILY 90 tablet 0 11/02/2016 at Unknown time  . cilostazol (PLETAL) 100 MG tablet TAKE ONE TABLET BY MOUTH TWICE DAILY 180 tablet 0 11/02/2016 at Unknown time  . citalopram (CELEXA) 20 MG tablet TAKE ONE TABLET BY MOUTH ONCE DAILY 90 tablet 0 11/02/2016 at Unknown time  . clopidogrel (PLAVIX) 75 MG tablet TAKE ONE TABLET BY MOUTH ONCE DAILY 90 tablet 1 11/02/2016 at Unknown time  . lisinopril (PRINIVIL,ZESTRIL) 20 MG tablet TAKE ONE TABLET BY MOUTH ONCE DAILY 30 tablet 0 11/02/2016 at Unknown time  . metFORMIN (GLUCOPHAGE) 500 MG tablet TAKE ONE TABLET BY MOUTH TWICE DAILY WITH MEALS 180 tablet 0 11/02/2016 at Unknown time  . mirtazapine (REMERON) 30 MG tablet TAKE ONE TABLET BY  MOUTH ONCE DAILY WITH BREAKFAST 90 tablet 0 11/02/2016 at Unknown time  . omeprazole (PRILOSEC) 20 MG capsule TAKE ONE CAPSULE BY MOUTH ONCE DAILY 90 capsule 0 11/02/2016 at Unknown time  . ranitidine (ZANTAC) 150 MG tablet Take 1 tablet (150 mg total) by mouth 2 (two) times daily. 180 tablet 1 11/02/2016 at Unknown time    Assessment: 75 y.oM on heparin for ongoing ACS workup.  Heparin level is therapeutic this morning with stable CBC.  Goal of Therapy:  Heparin level 0.3-0.7 units/ml Monitor platelets by anticoagulation protocol: Yes   Plan:  1. Continue heparin infusion at 1000 units/hr  2. Daily heparin level and CBC 3. F/u post cath   Vincenza Hews, PharmD, BCPS 11/04/2016, 7:51 AM Pager: [phone]

## 2016-11-04 NOTE — Progress Notes (Signed)
NT went to get pt's VS and O2 sats in the 70's. Unsuccessful attempts to bring pt's O2 to above 90. Pt uncooperative and agitated. [place] O2 turned up, but pt's O2 continued to stay below 90. Non-rebreather placed and MD notified. Pt remained agitated and refused to keep on the non-rebreather, so [place], [place] placed orders for non violent restraints to obtain ABG's and so that pt would keep his non-rebreather on. Resp notified of ABG orders and came to bedside to complete. Pt's son, Centanni was notified of his condition after both his other son, Nicole Kindred, and his daughter, Claiborne Billings, could not be reached. Pt is somewhat oriented and and states that he wants to be left alone and that he will not be going for his cardiac cath today. Pt remains agitated and in restraints at this time. He refuses to allow staff to check his O2 saturation on either his ear or his fingers. Awaiting ABG results and then will notify on call of those results. Will continue to assess and monitor.

## 2016-11-04 NOTE — Progress Notes (Signed)
Wean patient down to 55% O2 and stats maintained 94-96 for 10 minutes.  Currently on 35% with  O2 stats at 94%

## 2016-11-04 NOTE — Progress Notes (Signed)
Pharmacy Antibiotic Note  Perry Kennedy is a 75 y.o. male admitted on 11/02/2016 for CAP per CT with emphysematous disease. Empirically placed on ceftriaxone and azithromycin. Rapid response called secondary to respiratory distress and O2 saturation of 82%. Broadening ABX to vancomycin and zosyn. Pt remains afebrile, WBC 15.9, PCT 0.19, LA normalized. SCr 0.87 as of 12/14.   Plan: -Vancomycin 1250mg  x1 followed by 750mg  q12h -Zosyn 3.375g q8h -Monitor renal fxn, clinical course, cultures  Height: 5\' 10"  (177.8 cm) Weight: 141 lb 9.6 oz (64.2 kg) IBW/kg (Calculated) : 73  Temp (24hrs), Avg:98.2 F (36.8 C), Min:97.8 F (36.6 C), Max:98.5 F (36.9 C)   Recent Labs Lab 11/02/16 1552 11/02/16 1650 11/02/16 2027 11/02/16 2236 11/03/16 0139 11/03/16 0417 11/04/16 0428  WBC 17.1*  --   --   --   --  8.1 15.9*  CREATININE 0.89  --   --   --   --  0.87  --   LATICACIDVEN  --  4.22* 1.69 2.9* 1.7 1.4  --     Estimated Creatinine Clearance: 67.6 mL/min (by C-G formula based on SCr of 0.87 mg/dL).    Allergies  Allergen Reactions  . Morphine And Related Other (See Comments)    "goes crazy"    Antimicrobials this admission:  12/13 CTX >> 12/15 12/13 Zithromax >> 12/20 12/15 Vancomycin >> 12/15 zosyn >>  Dose adjustments this admission:  N/A  Microbiology results:  12/13 BCx: ng x24 hours 12/13 UCx: NGF 12/13 Influenza PCR: negative   Thank you for allowing pharmacy to be a part of this patient's care.  Judieth Keens 11/04/2016 11:55 AM

## 2016-11-04 NOTE — NC FL2 (Signed)
[place] LEVEL OF CARE SCREENING TOOL     IDENTIFICATION  Patient Name: Perry Kennedy Birthdate: 09/17/1941 Sex: male Admission Date (Current Location): 11/02/2016  [facility] and [place] Number:  [place] and Address:  [facility] [place], [place] [address] [place], [place] [address]      Provider Number: [id number]  Attending Physician Name and Address:  Doreatha Lew, MD  Relative Name and Phone Number:  Claiborne Billings, daughter, [phone]    Current Level of Care: Hospital Recommended Level of Care: [place] Prior Approval Number:    Date Approved/Denied:   PASRR Number: [id number]  Discharge Plan: SNF    Current Diagnoses: Patient Active Problem List   Diagnosis Date Noted  . Elevated troponin 11/03/2016  . Other hypertrophic cardiomyopathy ([place]) 11/03/2016  . Sepsis ([place]) 11/02/2016  . Syncope 11/02/2016  . Hyponatremia 11/02/2016  . Community acquired pneumonia   . Depression 05/28/2014  . Acute CVA (cerebrovascular accident) ([place]) 11/25/2013  . Type 2 diabetes mellitus with vascular disease ([place]) 11/23/2013  . 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 Hyperlipidemia due to type 1 diabetes mellitus ([place]) 01/03/2008  . SMOKER 01/03/2008  . Essential hypertension 01/03/2008  . Coronary atherosclerosis 01/03/2008  . CAD (coronary artery disease) of artery bypass graft 01/03/2008  . Peripheral vascular disease ([place]) 01/03/2008  . INTERNAL HEMORRHOIDS 01/03/2008  . ACID REFLUX DISEASE 01/03/2008    Orientation RESPIRATION BLADDER Height & Weight     Self  O2 (Non-rebreather mask) Continent Weight: 64.2 kg (141 lb 9.6 oz) Height:  5\' 10"  (177.8 cm)  BEHAVIORAL SYMPTOMS/MOOD NEUROLOGICAL BOWEL NUTRITION STATUS      Continent Diet (Please see DC Summary)  AMBULATORY STATUS COMMUNICATION OF NEEDS Skin   Limited Assist Verbally Normal                       Personal Care Assistance  Level of Assistance  Bathing, Feeding, Dressing Bathing Assistance: Maximum assistance Feeding assistance: Independent Dressing Assistance: Limited assistance     Functional Limitations Info  Hearing   Hearing Info: Impaired      SPECIAL CARE FACTORS FREQUENCY  PT (By licensed PT)     PT Frequency: 5x/week              Contractures      Additional Factors Info  Code Status, Allergies, Psychotropic Code Status Info: Full Allergies Info: Morphine And Related Psychotropic Info: Haldol         Current Medications (11/04/2016):  This is the current hospital active medication list Current Facility-Administered Medications  Medication Dose Route Frequency Provider Last Rate Last Dose  . 0.9 %  sodium chloride infusion  250 mL Intravenous PRN Erma Heritage, PA      . 0.9 %  sodium chloride infusion   Intravenous Continuous Belva Crome, MD      . acetaminophen (TYLENOL) tablet 650 mg  650 mg Oral Q6H PRN Rise Patience, MD   650 mg at 11/03/16 1744   Or  . acetaminophen (TYLENOL) suppository 650 mg  650 mg Rectal Q6H PRN Rise Patience, MD      . albuterol (PROVENTIL) (2.5 MG/3ML) 0.083% nebulizer solution 2.5 mg  2.5 mg Nebulization Q4H PRN Gardiner Barefoot, NP   2.5 mg at 11/03/16 2156  . atorvastatin (LIPITOR) tablet 80 mg  80 mg Oral q1800 Gardiner Barefoot, NP   80 mg at 11/03/16  1650  . azithromycin (ZITHROMAX) 500 mg in dextrose 5 % 250 mL IVPB  500 mg Intravenous Q24H Rise Patience, MD 250 mL/hr at 11/03/16 1747 500 mg at 11/03/16 1747  . cefTRIAXone (ROCEPHIN) 1 g in dextrose 5 % 50 mL IVPB  1 g Intravenous Q24H Rise Patience, MD 100 mL/hr at 11/03/16 1651 1 g at 11/03/16 1651  . cilostazol (PLETAL) tablet 100 mg  100 mg Oral BID Rise Patience, MD   100 mg at 11/03/16 2256  . citalopram (CELEXA) tablet 20 mg  20 mg Oral Daily Rise Patience, MD   20 mg at 11/03/16 0905  . clopidogrel (PLAVIX) tablet 75 mg  75 mg Oral Daily  Gardiner Barefoot, NP   75 mg at 11/03/16 0905  . famotidine (PEPCID) tablet 20 mg  20 mg Oral BID Rise Patience, MD   20 mg at 11/03/16 2256  . haloperidol lactate (HALDOL) injection 0.5 mg  0.5 mg Intravenous Once Doreatha Lew, MD      . insulin aspart (novoLOG) injection 0-9 Units  0-9 Units Subcutaneous TID WC Rise Patience, MD   3 Units at 11/03/16 1744  . MEDLINE mouth rinse  15 mL Mouth Rinse BID Rise Patience, MD   15 mL at 11/03/16 0906  . metoprolol tartrate (LOPRESSOR) tablet 12.5 mg  12.5 mg Oral BID Gardiner Barefoot, NP   12.5 mg at 11/03/16 2256  . mirtazapine (REMERON) tablet 30 mg  30 mg Oral q morning - 10a Rise Patience, MD   30 mg at 11/03/16 0905  . nicotine (NICODERM CQ - dosed in mg/24 hours) patch 14 mg  14 mg Transdermal Daily Rise Patience, MD   14 mg at 11/03/16 0907  . ondansetron (ZOFRAN) tablet 4 mg  4 mg Oral Q6H PRN Rise Patience, MD   4 mg at 11/03/16 1744   Or  . ondansetron (ZOFRAN) injection 4 mg  4 mg Intravenous Q6H PRN Rise Patience, MD      . pantoprazole (PROTONIX) EC tablet 40 mg  40 mg Oral Daily Rise Patience, MD   40 mg at 11/03/16 0906  . sodium chloride flush (NS) 0.9 % injection 3 mL  3 mL Intravenous Q12H [place], [place]      . sodium chloride flush (NS) 0.9 % injection 3 mL  3 mL Intravenous PRN Erma Heritage, PA         Discharge Medications: Please see discharge summary for a list of discharge medications.  Relevant Imaging Results:  Relevant Lab Results:   Additional Information SSN: [place], [place]
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
## 2016-11-04 NOTE — Significant Event (Signed)
Rapid Response Event Note  Overview:  Called for patient on NRB sats 80's Time Called: 0803 Arrival Time: 0810 Event Type: Respiratory  Initial Focused Assessment:  Called to evaluate patient on NRB sats 82%.  On my arrival to patients room, Rn at bedside.  Patient agitated, with increased WOB and SOB.     Interventions:  Sats orginially 82% on NRB increased oxygen flow a little, sats increased to 92%.  Breath Sounds Coarse with crackles.  148/73, 100, 18, 92% on NRB.  Dr. Quincy Simmonds at bedside.    Plan of Care (if not transferred):  Orders received by MD, RN to follow through.  Patient to transfer to SDU  Event Summary:  RN to call if assistance needed   at      at          [facility], Harlin Rain

## 2016-11-04 NOTE — Progress Notes (Signed)
Patient needing partial nonrebreather to maintain satisfactory sats. Unable to wean down to venturi. Dr. Quincy Simmonds aware of situation and orders received. Called report to [place] on [place]. Transferring to 3S 10
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
## 2016-11-05 LAB — HEPARIN LEVEL (UNFRACTIONATED)

## 2016-11-05 LAB — CBC
HEMATOCRIT: 25.3 % — AB (ref 39.0–52.0)
HEMOGLOBIN: 8.3 g/dL — AB (ref 13.0–17.0)
MCH: 22.7 pg — ABNORMAL LOW (ref 26.0–34.0)
MCHC: 32.8 g/dL (ref 30.0–36.0)
MCV: 69.3 fL — ABNORMAL LOW (ref 78.0–100.0)
Platelets: 228 10*3/uL (ref 150–400)
RBC: 3.65 MIL/uL — AB (ref 4.22–5.81)
RDW: 16.9 % — ABNORMAL HIGH (ref 11.5–15.5)
WBC: 10.9 10*3/uL — ABNORMAL HIGH (ref 4.0–10.5)

## 2016-11-05 LAB — BASIC METABOLIC PANEL
Anion gap: 11 (ref 5–15)
BUN: 12 mg/dL (ref 6–20)
CHLORIDE: 99 mmol/L — AB (ref 101–111)
CO2: 20 mmol/L — ABNORMAL LOW (ref 22–32)
Calcium: 8.2 mg/dL — ABNORMAL LOW (ref 8.9–10.3)
Creatinine, Ser: 0.98 mg/dL (ref 0.61–1.24)
GFR calc non Af Amer: >60 mL/min (ref >60)
Glucose, Bld: 149 mg/dL — ABNORMAL HIGH (ref 65–99)
POTASSIUM: 3.9 mmol/L (ref 3.5–5.1)
SODIUM: 130 mmol/L — AB (ref 135–145)

## 2016-11-05 LAB — GLUCOSE, CAPILLARY
GLUCOSE-CAPILLARY: 171 mg/dL — AB (ref 65–99)
GLUCOSE-CAPILLARY: 278 mg/dL — AB (ref 65–99)
GLUCOSE-CAPILLARY: 344 mg/dL — AB (ref 65–99)
Glucose-Capillary: 170 mg/dL — ABNORMAL HIGH (ref 65–99)
Glucose-Capillary: 336 mg/dL — ABNORMAL HIGH (ref 65–99)

## 2016-11-05 LAB — BLOOD GAS, ARTERIAL
Acid-base deficit: 4.5 mmol/L — ABNORMAL HIGH (ref 0.0–2.0)
Bicarbonate: 19.2 mmol/L — ABNORMAL LOW (ref 20.0–28.0)
Drawn by: 441371
O2 Content: 15 L/min
O2 SAT: 93.1 %
PATIENT TEMPERATURE: 100.2
PO2 ART: 75.2 mmHg — AB (ref 83.0–108.0)
pCO2 arterial: 31.9 mmHg — ABNORMAL LOW (ref 32.0–48.0)
pH, Arterial: 7.402 (ref 7.350–7.450)

## 2016-11-05 LAB — LEGIONELLA PNEUMOPHILA SEROGP 1 UR AG: L. PNEUMOPHILA SEROGP 1 UR AG: NEGATIVE

## 2016-11-05 LAB — PROCALCITONIN: PROCALCITONIN: 0.4 ng/mL

## 2016-11-05 MED ORDER — ALBUTEROL SULFATE (2.5 MG/3ML) 0.083% IN NEBU
2.5000 mg | INHALATION_SOLUTION | Freq: Four times a day (QID) | RESPIRATORY_TRACT | Status: DC
  Administered 2016-11-05 (×2): 2.5 mg via RESPIRATORY_TRACT
  Filled 2016-11-05: qty 3

## 2016-11-05 MED ORDER — ALBUTEROL SULFATE (2.5 MG/3ML) 0.083% IN NEBU
2.5000 mg | INHALATION_SOLUTION | Freq: Two times a day (BID) | RESPIRATORY_TRACT | Status: DC
  Administered 2016-11-06 – 2016-11-09 (×6): 2.5 mg via RESPIRATORY_TRACT
  Filled 2016-11-05 (×7): qty 3

## 2016-11-05 MED ORDER — METOPROLOL TARTRATE 25 MG PO TABS: 25.0000 mg | ORAL_TABLET | Freq: Two times a day (BID) | ORAL | Status: DC

## 2016-11-05 MED ORDER — ENOXAPARIN SODIUM 40 MG/0.4ML ~~LOC~~ SOLN
40.0000 mg | SUBCUTANEOUS | Status: DC
  Administered 2016-11-05 – 2016-11-09 (×5): 40 mg via SUBCUTANEOUS
  Filled 2016-11-05 (×5): qty 0.4

## 2016-11-05 MED ORDER — PREDNISONE 50 MG PO TABS
60.0000 mg | ORAL_TABLET | Freq: Every day | ORAL | Status: DC
  Administered 2016-11-05 – 2016-11-09 (×5): 60 mg via ORAL
  Filled 2016-11-05: qty 3
  Filled 2016-11-05 (×2): qty 1
  Filled 2016-11-05 (×2): qty 3

## 2016-11-05 MED ORDER — METOPROLOL TARTRATE 12.5 MG HALF TABLET
12.5000 mg | ORAL_TABLET | Freq: Two times a day (BID) | ORAL | Status: DC
  Administered 2016-11-05 – 2016-11-09 (×8): 12.5 mg via ORAL
  Filled 2016-11-05 (×8): qty 1

## 2016-11-05 NOTE — Progress Notes (Signed)
Pt CBG was 344 with no night coverage, call doctor Hal Hope and he stated to recheck CBG and give 6 units if over 300.  Rechecked CBG and it was 336, gave 6 units of insulin.

## 2016-11-05 NOTE — Clinical Social Work Note (Signed)
PT recommended CIR. CSW will continue to follow in case need of SNF arises.  [address], [place]

## 2016-11-05 NOTE — Progress Notes (Signed)
SLP Cancellation Note  Patient Details Name: Perry Kennedy MRN: [id number] DOB: Jan 05, 1941   Cancelled treatment:       Reason Eval/Treat Not Completed: Medical issues which prohibited therapy; pt unable to participate today d/t being placed on non-rebreather and limited tolerance of PO intake per nursing; desaturates during intake to 80's per nursing, so intake has been limited at this time; will re-attempt as able.   ADAMS,PAT, M.S., CCC-SLP 11/05/2016, 11:37 AM
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
## 2016-11-06 LAB — GLUCOSE, CAPILLARY
GLUCOSE-CAPILLARY: 171 mg/dL — AB (ref 65–99)
GLUCOSE-CAPILLARY: 249 mg/dL — AB (ref 65–99)
Glucose-Capillary: 250 mg/dL — ABNORMAL HIGH (ref 65–99)
Glucose-Capillary: 253 mg/dL — ABNORMAL HIGH (ref 65–99)

## 2016-11-06 LAB — CBC
HCT: 23.9 % — ABNORMAL LOW (ref 39.0–52.0)
Hemoglobin: 7.8 g/dL — ABNORMAL LOW (ref 13.0–17.0)
MCH: 22.5 pg — ABNORMAL LOW (ref 26.0–34.0)
MCHC: 32.6 g/dL (ref 30.0–36.0)
MCV: 69.1 fL — AB (ref 78.0–100.0)
PLATELETS: 234 10*3/uL (ref 150–400)
RBC: 3.46 MIL/uL — ABNORMAL LOW (ref 4.22–5.81)
RDW: 16.8 % — AB (ref 11.5–15.5)
WBC: 10.9 10*3/uL — AB (ref 4.0–10.5)

## 2016-11-06 LAB — PROCALCITONIN: Procalcitonin: 0.21 ng/mL

## 2016-11-06 MED ORDER — INSULIN GLARGINE 100 UNIT/ML ~~LOC~~ SOLN
5.0000 [IU] | Freq: Every day | SUBCUTANEOUS | Status: DC
  Filled 2016-11-06 (×2): qty 0.05
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
## 2016-11-07 DIAGNOSIS — E871 Hypo-osmolality and hyponatremia: Secondary | ICD-10-CM

## 2016-11-07 DIAGNOSIS — R451 Restlessness and agitation: Secondary | ICD-10-CM

## 2016-11-07 DIAGNOSIS — J9601 Acute respiratory failure with hypoxia: Secondary | ICD-10-CM

## 2016-11-07 DIAGNOSIS — I422 Other hypertrophic cardiomyopathy: Secondary | ICD-10-CM

## 2016-11-07 DIAGNOSIS — E1159 Type 2 diabetes mellitus with other circulatory complications: Secondary | ICD-10-CM

## 2016-11-07 DIAGNOSIS — R739 Hyperglycemia, unspecified: Secondary | ICD-10-CM

## 2016-11-07 LAB — CBC
HEMATOCRIT: 26.3 % — AB (ref 39.0–52.0)
HEMOGLOBIN: 8.7 g/dL — AB (ref 13.0–17.0)
MCH: 22.8 pg — AB (ref 26.0–34.0)
MCHC: 33.1 g/dL (ref 30.0–36.0)
MCV: 69 fL — ABNORMAL LOW (ref 78.0–100.0)
Platelets: 285 10*3/uL (ref 150–400)
RBC: 3.81 MIL/uL — ABNORMAL LOW (ref 4.22–5.81)
RDW: 17.2 % — ABNORMAL HIGH (ref 11.5–15.5)
WBC: 10.9 10*3/uL — ABNORMAL HIGH (ref 4.0–10.5)

## 2016-11-07 LAB — CULTURE, BLOOD (ROUTINE X 2)
CULTURE: NO GROWTH
Culture: NO GROWTH

## 2016-11-07 LAB — BASIC METABOLIC PANEL
Anion gap: 10 (ref 5–15)
BUN: 14 mg/dL (ref 6–20)
CO2: 22 mmol/L (ref 22–32)
CREATININE: 0.9 mg/dL (ref 0.61–1.24)
Calcium: 8.8 mg/dL — ABNORMAL LOW (ref 8.9–10.3)
Chloride: 105 mmol/L (ref 101–111)
GFR calc non Af Amer: >60 mL/min (ref >60)
Glucose, Bld: 130 mg/dL — ABNORMAL HIGH (ref 65–99)
POTASSIUM: 4 mmol/L (ref 3.5–5.1)
Sodium: 137 mmol/L (ref 135–145)

## 2016-11-07 LAB — GLUCOSE, CAPILLARY
GLUCOSE-CAPILLARY: 181 mg/dL — AB (ref 65–99)
GLUCOSE-CAPILLARY: 155 mg/dL — AB (ref 65–99)
Glucose-Capillary: 140 mg/dL — ABNORMAL HIGH (ref 65–99)
Glucose-Capillary: 189 mg/dL — ABNORMAL HIGH (ref 65–99)

## 2016-11-07 LAB — OCCULT BLOOD X 1 CARD TO LAB, STOOL: Fecal Occult Bld: NEGATIVE

## 2016-11-07 MED ORDER — INSULIN GLARGINE 100 UNIT/ML ~~LOC~~ SOLN
12.0000 [IU] | Freq: Every day | SUBCUTANEOUS | Status: DC
  Administered 2016-11-07 – 2016-11-09 (×3): 12 [IU] via SUBCUTANEOUS
  Filled 2016-11-07 (×3): qty 0.12

## 2016-11-07 MED ORDER — AMOXICILLIN-POT CLAVULANATE 875-125 MG PO TABS
1.0000 | ORAL_TABLET | Freq: Two times a day (BID) | ORAL | Status: DC
  Administered 2016-11-07 – 2016-11-09 (×5): 1 via ORAL
  Filled 2016-11-07 (×5): qty 1

## 2016-11-07 MED ORDER — INSULIN ASPART 100 UNIT/ML ~~LOC~~ SOLN
4.0000 [IU] | Freq: Three times a day (TID) | SUBCUTANEOUS | Status: DC
  Administered 2016-11-07 – 2016-11-09 (×8): 4 [IU] via SUBCUTANEOUS

## 2016-11-07 NOTE — Care Management Important Message (Signed)
Important Message  Patient Details  Name: Perry Kennedy MRN: [id number] Date of Birth: Nov 17, 1941   Medicare Important Message Given:  Yes    Nathen May 11/07/2016, 10:53 AM
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
## 2016-11-07 NOTE — Progress Notes (Signed)
Patient transferred to 5W with nurse tech by wheelchair.
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
## 2016-11-07 NOTE — Progress Notes (Signed)
Patient trasfered from 3S to [phone] via wheelchair; alert and oriented x 4 with periods of confusion; no complaints of pain; IV saline locked in RFA. Orient patient to room and unit; tele box 18 placed per MD order and central monitor notified; instructed how to use the call bell and  fall risk precautions. Will continue to monitor the patient.

## 2016-11-07 NOTE — Progress Notes (Signed)
OT Cancellation Note  Patient Details Name: KHANH GRUPE MRN: [id number] DOB: 18-Dec-1940   Cancelled Treatment:    Reason Eval/Treat Not Completed: Patient at procedure or test/ unavailable.  Pt switching units.  [place], OTR/L [id number]   Lucille Passy M 11/07/2016, 11:36 AM
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
## 2016-11-07 NOTE — Progress Notes (Signed)
Noted pt remains on NRB. I continue to recommend SNF rehab at this time as I did 11/03/2016. I have left voicemail for RN CM of my recommendation. [id number]

## 2016-11-08 LAB — CBC
HCT: 25.9 % — ABNORMAL LOW (ref 39.0–52.0)
Hemoglobin: 8.4 g/dL — ABNORMAL LOW (ref 13.0–17.0)
MCH: 22.5 pg — ABNORMAL LOW (ref 26.0–34.0)
MCHC: 32.4 g/dL (ref 30.0–36.0)
MCV: 69.4 fL — AB (ref 78.0–100.0)
PLATELETS: 271 10*3/uL (ref 150–400)
RBC: 3.73 MIL/uL — ABNORMAL LOW (ref 4.22–5.81)
RDW: 17.3 % — AB (ref 11.5–15.5)
WBC: 9.9 10*3/uL (ref 4.0–10.5)

## 2016-11-08 LAB — BASIC METABOLIC PANEL
Anion gap: 7 (ref 5–15)
BUN: 15 mg/dL (ref 6–20)
CALCIUM: 8.8 mg/dL — AB (ref 8.9–10.3)
CO2: 25 mmol/L (ref 22–32)
CREATININE: 1 mg/dL (ref 0.61–1.24)
Chloride: 104 mmol/L (ref 101–111)
GFR calc non Af Amer: >60 mL/min (ref >60)
Glucose, Bld: 119 mg/dL — ABNORMAL HIGH (ref 65–99)
Potassium: 3.7 mmol/L (ref 3.5–5.1)
SODIUM: 136 mmol/L (ref 135–145)

## 2016-11-08 LAB — GLUCOSE, CAPILLARY
GLUCOSE-CAPILLARY: 139 mg/dL — AB (ref 65–99)
Glucose-Capillary: 100 mg/dL — ABNORMAL HIGH (ref 65–99)
Glucose-Capillary: 190 mg/dL — ABNORMAL HIGH (ref 65–99)
Glucose-Capillary: 267 mg/dL — ABNORMAL HIGH (ref 65–99)

## 2016-11-08 LAB — PROCALCITONIN: Procalcitonin: 0.13 ng/mL
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
## 2016-11-08 NOTE — Progress Notes (Signed)
CSW spoke with patient about PT recommendation for rehab.  Patient is not interested in going to a rehab center at this time- would like to return home at time of DC.  Patient states he lives alone but feels like he can take care of needs at home.  No further CSW needs at this time- CSW signing off  Jorge Ny, [place] Social Worker [phone]

## 2016-11-08 NOTE — Care Management Note (Signed)
Case Management Note  Patient Details  Name: Perry Kennedy MRN: [id number] Date of Birth: 12-18-1940  Subjective/Objective:                 Notified by CSW that patient refused SNF. Patient states that his son lives across the street and his daughter lives very close as well. He states they check in on him. He states that he has a walker and a wheelchair at home. CM discussed need for [facility], however patient refuses [facility] as well. Currently on supplemental oxygen, non dependent.    Action/Plan:  Watch for home oxygen needs. Refusing HH at this time. Patient will have cardiac cath prior to discharge, may reconsider after. CM to continue to follow. Expected Discharge Date:                  Expected Discharge Plan:  Skilled Nursing Facility  In-House Referral:  Clinical Social Work  Discharge planning Services  CM Consult  Post Acute Care Choice:    Choice offered to:     DME Arranged:    DME Agency:     HH Arranged:    [place] Agency:     Status of Service:  In process, will continue to follow  If discussed at Long Length of Stay Meetings, dates discussed:    Additional Comments:  Carles Collet, RN 11/08/2016, 1:12 PM

## 2016-11-09 DIAGNOSIS — Z825 Family history of asthma and other chronic lower respiratory diseases: Secondary | ICD-10-CM | POA: Diagnosis not present

## 2016-11-09 DIAGNOSIS — E1151 Type 2 diabetes mellitus with diabetic peripheral angiopathy without gangrene: Secondary | ICD-10-CM | POA: Diagnosis not present

## 2016-11-09 DIAGNOSIS — I110 Hypertensive heart disease with heart failure: Secondary | ICD-10-CM | POA: Diagnosis not present

## 2016-11-09 DIAGNOSIS — Z7984 Long term (current) use of oral hypoglycemic drugs: Secondary | ICD-10-CM | POA: Diagnosis not present

## 2016-11-09 DIAGNOSIS — Z8249 Family history of ischemic heart disease and other diseases of the circulatory system: Secondary | ICD-10-CM | POA: Diagnosis not present

## 2016-11-09 DIAGNOSIS — A419 Sepsis, unspecified organism: Secondary | ICD-10-CM | POA: Diagnosis not present

## 2016-11-09 DIAGNOSIS — Z9889 Other specified postprocedural states: Secondary | ICD-10-CM | POA: Diagnosis not present

## 2016-11-09 DIAGNOSIS — Z801 Family history of malignant neoplasm of trachea, bronchus and lung: Secondary | ICD-10-CM | POA: Diagnosis not present

## 2016-11-09 DIAGNOSIS — K219 Gastro-esophageal reflux disease without esophagitis: Secondary | ICD-10-CM | POA: Diagnosis not present

## 2016-11-09 DIAGNOSIS — I422 Other hypertrophic cardiomyopathy: Secondary | ICD-10-CM | POA: Diagnosis not present

## 2016-11-09 DIAGNOSIS — I2510 Atherosclerotic heart disease of native coronary artery without angina pectoris: Secondary | ICD-10-CM | POA: Diagnosis not present

## 2016-11-09 DIAGNOSIS — J189 Pneumonia, unspecified organism: Secondary | ICD-10-CM | POA: Diagnosis not present

## 2016-11-09 DIAGNOSIS — Z833 Family history of diabetes mellitus: Secondary | ICD-10-CM | POA: Diagnosis not present

## 2016-11-09 DIAGNOSIS — Z888 Allergy status to other drugs, medicaments and biological substances status: Secondary | ICD-10-CM | POA: Diagnosis not present

## 2016-11-09 DIAGNOSIS — I248 Other forms of acute ischemic heart disease: Secondary | ICD-10-CM | POA: Diagnosis not present

## 2016-11-09 DIAGNOSIS — J449 Chronic obstructive pulmonary disease, unspecified: Secondary | ICD-10-CM | POA: Diagnosis present

## 2016-11-09 DIAGNOSIS — Z79899 Other long term (current) drug therapy: Secondary | ICD-10-CM | POA: Diagnosis not present

## 2016-11-09 DIAGNOSIS — I25119 Atherosclerotic heart disease of native coronary artery with unspecified angina pectoris: Secondary | ICD-10-CM

## 2016-11-09 DIAGNOSIS — Z8673 Personal history of transient ischemic attack (TIA), and cerebral infarction without residual deficits: Secondary | ICD-10-CM | POA: Diagnosis not present

## 2016-11-09 DIAGNOSIS — J9601 Acute respiratory failure with hypoxia: Secondary | ICD-10-CM | POA: Diagnosis not present

## 2016-11-09 DIAGNOSIS — I5020 Unspecified systolic (congestive) heart failure: Secondary | ICD-10-CM | POA: Diagnosis not present

## 2016-11-09 DIAGNOSIS — D509 Iron deficiency anemia, unspecified: Secondary | ICD-10-CM | POA: Diagnosis not present

## 2016-11-09 DIAGNOSIS — Z86010 Personal history of colonic polyps: Secondary | ICD-10-CM | POA: Diagnosis not present

## 2016-11-09 DIAGNOSIS — E871 Hypo-osmolality and hyponatremia: Secondary | ICD-10-CM | POA: Diagnosis not present

## 2016-11-09 DIAGNOSIS — Z86718 Personal history of other venous thrombosis and embolism: Secondary | ICD-10-CM | POA: Diagnosis not present

## 2016-11-09 LAB — CBC
HCT: 25.4 % — ABNORMAL LOW (ref 39.0–52.0)
HEMOGLOBIN: 8 g/dL — AB (ref 13.0–17.0)
MCH: 22.3 pg — AB (ref 26.0–34.0)
MCHC: 31.5 g/dL (ref 30.0–36.0)
MCV: 70.8 fL — AB (ref 78.0–100.0)
Platelets: 271 10*3/uL (ref 150–400)
RBC: 3.59 MIL/uL — AB (ref 4.22–5.81)
RDW: 17.3 % — ABNORMAL HIGH (ref 11.5–15.5)
WBC: 10.3 10*3/uL (ref 4.0–10.5)

## 2016-11-09 LAB — BASIC METABOLIC PANEL
Anion gap: 8 (ref 5–15)
BUN: 20 mg/dL (ref 6–20)
CHLORIDE: 104 mmol/L (ref 101–111)
CO2: 25 mmol/L (ref 22–32)
CREATININE: 0.9 mg/dL (ref 0.61–1.24)
Calcium: 9 mg/dL (ref 8.9–10.3)
GFR calc Af Amer: >60 mL/min (ref >60)
GFR calc non Af Amer: >60 mL/min (ref >60)
Glucose, Bld: 182 mg/dL — ABNORMAL HIGH (ref 65–99)
POTASSIUM: 4.3 mmol/L (ref 3.5–5.1)
Sodium: 137 mmol/L (ref 135–145)

## 2016-11-09 LAB — GLUCOSE, CAPILLARY
Glucose-Capillary: 168 mg/dL — ABNORMAL HIGH (ref 65–99)
Glucose-Capillary: 148 mg/dL — ABNORMAL HIGH (ref 65–99)
Glucose-Capillary: 189 mg/dL — ABNORMAL HIGH (ref 65–99)

## 2016-11-09 MED ORDER — PREDNISONE 20 MG PO TABS: ORAL_TABLET | ORAL | 0 refills | Status: DC

## 2016-11-09 MED ORDER — AMOXICILLIN-POT CLAVULANATE 875-125 MG PO TABS: 1.0000 | ORAL_TABLET | Freq: Two times a day (BID) | ORAL | 0 refills | Status: AC

## 2016-11-09 MED ORDER — METOPROLOL TARTRATE 25 MG PO TABS: 12.5000 mg | ORAL_TABLET | Freq: Two times a day (BID) | ORAL | 0 refills | Status: DC

## 2016-11-09 MED ORDER — IPRATROPIUM-ALBUTEROL 0.5-2.5 (3) MG/3ML IN SOLN: 3.0000 mL | RESPIRATORY_TRACT | 0 refills | Status: DC | PRN
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
## 2016-11-09 NOTE — Progress Notes (Signed)
PT Cancellation Note  Patient Details Name: Perry Kennedy MRN: [id number] DOB: Oct 02, 1941   Cancelled Treatment:    Reason Eval/Treat Not Completed: Other (comment) (dressing to leave when PT came by). Will see tomorrow if dc is held for some reason.   Ramond Dial 11/09/2016, 4:47 PM   Mee Hives, PT [facility] Acute Rehab Dept. Number: [place] [facility] [place]

## 2016-11-09 NOTE — Progress Notes (Signed)
SATURATION QUALIFICATIONS: (This note is used to comply with regulatory documentation for home oxygen)  Patient Saturations on Room Air at Rest = 85%  Patient Saturations on Room Air while Ambulating = 82%  Patient Saturations on 6 Liters of oxygen while Ambulating = 92%  Please briefly explain why patient needs home oxygen: maintain oxygen saturation while walking and at rest.  Inez Catalina, RN
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
## 2016-11-09 NOTE — Progress Notes (Signed)
Discharge Instructions   Activity Instructions  YOU HAVE NO RESTRICTIONS UNLESS INSTRUCTED OTHERWISE  Restrictions: none  YOU ARE TO CONTINUE PREADMISSION DIET UNLESS OTHERWISE INSTRUCTED  Diet: heart healthy     Pain Assessment  Pain Score: 0-No pain    Current pain management regimen- Instructed: Yes.    IF PAIN INTENSIFIES OR PAIN IS UNRELIEVED WITH MEDICATIONS AS ORDERED, CALL YOUR DOCTOR - Instructed: Yes.   ____________________________________________________________________________  Remember to contact your doctor for follow-up as instructed   Check with your doctor regarding any labs, xrays, or studies that have not received final results. If you have problems related to your current illness/procedures or if your symptoms persist or worsen, please contact your doctor. ____________________________________________________________________________  Individual Instructions/Custom Documents/Teaching Sheet Use this area to list any individualized instructions, appointment, continued therapies, etc, that the patient/family is to continue after discharge. If additional space is needed, use "Additional Instructions"  Additional Instructions related to continuing or unresolved problems  Weight Management is important to your health. Call your physician if you experience sudden weight loss or weight gain.  Smoking is hazardous to your health. Second hand smoke is hazardous to those around you. If you smoke, you can contact your physician for smoking cessation information and classes.  IV Sites - notify your doctor for any extreme redness or observed drainage from any old IV site. Allergies as of 11/09/2016      Reactions   Morphine And Related Other (See Comments)   "goes crazy"      Medication List    STOP taking these medications   lisinopril 20 MG tablet Commonly known as:  PRINIVIL,ZESTRIL     TAKE these medications   amoxicillin-clavulanate 875-125 MG  tablet Commonly known as:  AUGMENTIN Take 1 tablet by mouth every 12 (twelve) hours.   atorvastatin 40 MG tablet Commonly known as:  LIPITOR TAKE ONE TABLET BY MOUTH ONCE DAILY   cilostazol 100 MG tablet Commonly known as:  PLETAL TAKE ONE TABLET BY MOUTH TWICE DAILY   citalopram 20 MG tablet Commonly known as:  CELEXA TAKE ONE TABLET BY MOUTH ONCE DAILY   clopidogrel 75 MG tablet Commonly known as:  PLAVIX TAKE ONE TABLET BY MOUTH ONCE DAILY   ipratropium-albuterol 0.5-2.5 (3) MG/3ML Soln Commonly known as:  DUONEB Take 3 mLs by nebulization every 4 (four) hours as needed. Diagnosis: J44.9, J96.01   metFORMIN 500 MG tablet Commonly known as:  GLUCOPHAGE TAKE ONE TABLET BY MOUTH TWICE DAILY WITH MEALS   metoprolol tartrate 25 MG tablet Commonly known as:  LOPRESSOR Take 0.5 tablets (12.5 mg total) by mouth 2 (two) times daily.   mirtazapine 30 MG tablet Commonly known as:  REMERON TAKE ONE TABLET BY MOUTH ONCE DAILY WITH BREAKFAST   omeprazole 20 MG capsule Commonly known as:  PRILOSEC TAKE ONE CAPSULE BY MOUTH ONCE DAILY   predniSONE 20 MG tablet Commonly known as:  DELTASONE Take 2 tabs po daily with breakfast for 5 days, then take 1 tab po daily for 5 days, then stop   ranitidine 150 MG tablet Commonly known as:  ZANTAC Take 1 tablet (150 mg total) by mouth 2 (two) times daily.            Durable Medical Equipment        Start     Ordered   11/09/16 1452  For home use only DME Nebulizer/meds  Once    Question:  Patient needs a nebulizer to treat with the following condition  Answer:  COPD, severe ([place])   11/09/16 1451   11/09/16 1444  For home use only DME oxygen  Once    Question Answer Comment  Mode or (Route) Nasal cannula   Liters per Minute 3   Frequency Continuous (stationary and portable oxygen unit needed)   Oxygen conserving device Yes   Oxygen delivery system Gas      11/09/16 1444    Braulio L Limes to be D/C'd Home per MD order.   Discussed with the patient and all questions fully answered.  VSS, Skin clean, dry and intact without evidence of skin break down, no evidence of skin tears noted. IV catheter discontinued intact. Site without signs and symptoms of complications. Dressing and pressure applied.  An After Visit Summary was printed and given to the patient. Patient received prescription.  D/c education completed with patient/family including follow up instructions, medication list, d/c activities limitations if indicated, with other d/c instructions as indicated by MD - patient able to verbalize understanding, all questions fully answered.   Patient instructed to return to ED, call 911, or call MD for any changes in condition.   Patient escorted via [place], and D/C home via private auto.  Betha Loa Louvina Cleary 11/09/2016 5:49 PM

## 2016-11-09 NOTE — Care Management Note (Signed)
Case Management Note  Patient Details  Name: JOOD MONJARAZ MRN: [id number] Date of Birth: 1941-02-08  Subjective/Objective:                 Spoke to patient about of the importance of compliance with home oxygen, the dangers of smoking with oxygen, and encouraged [facility] RN. Patient agreed to [facility] RN referral made to [facility] made aware of new oxygen needs with this DC. Oxygen and nebulizer to be delivered to room prior to discharge.   Action/Plan:   Expected Discharge Date:                  Expected Discharge Plan:  [place]  In-House Referral:  Clinical Social Work  Discharge planning Services  CM Consult  Post Acute Care Choice:  Durable Medical Equipment, Home Health Choice offered to:  Patient  DME Arranged:  Chiropodist, Oxygen DME Agency:  [place]:  RN [facility] Agency:  [place]  Status of Service:  In process, will continue to follow  If discussed at Long Length of Stay Meetings, dates discussed:    Additional Comments:  Carles Collet, RN 11/09/2016, 3:29 PM

## 2016-11-09 NOTE — Progress Notes (Signed)
Speech Language Pathology Treatment:    Patient Details Name: Perry Kennedy MRN: [id number] DOB: 06-07-1941 Today's Date: 11/09/2016 Time: 7409-9278 SLP Time Calculation (min) (ACUTE ONLY): 16 min  Assessment / Plan / Recommendation Clinical Impression  Pt seen with am meal, complaining about appearance of food. Pt is upright in chair, self feeding, masticating adequately. Reports no difficulty masticating solids at baseline. Will upgrade diet to regular. No SLP f/u needed, will sign off.    HPI HPI: 75 y.o.malewas brought to the ER after patient had a syncopal episode while driving. CT scan head negative; CT angio chest negative. CT spine showed "bilateral parotid masses. Pt with sepsis due to PNA and also with new CHF. PMH - CAD, HTN, DM, PVD with bypass, DVT. Previous SLP evaluation in 2015 recommended Dys 1 diet and thin liquids with advancement to Dys 3 diet prior to SLP signing off. Pt was kept on mechanical soft diet due to condition of dentition.      SLP Plan  All goals met     Recommendations  Diet recommendations: Regular;Thin liquid Liquids provided via: Cup;Straw Medication Administration: Whole meds with liquid Supervision: Patient able to self feed;Intermittent supervision to cue for compensatory strategies Compensations: Slow rate;Small sips/bites Postural Changes and/or Swallow Maneuvers: Seated upright 90 degrees                Plan: All goals met       GO               Herbie [place], MA CCC-SLP [phone]  Lynann Beaver 11/09/2016, 9:39 AM

## 2016-11-10 ENCOUNTER — Telehealth: Payer: Self-pay | Admitting: *Deleted

## 2016-11-11 DIAGNOSIS — Z8673 Personal history of transient ischemic attack (TIA), and cerebral infarction without residual deficits: Secondary | ICD-10-CM | POA: Diagnosis not present

## 2016-11-11 DIAGNOSIS — M109 Gout, unspecified: Secondary | ICD-10-CM | POA: Diagnosis not present

## 2016-11-11 DIAGNOSIS — J449 Chronic obstructive pulmonary disease, unspecified: Secondary | ICD-10-CM | POA: Diagnosis not present

## 2016-11-11 DIAGNOSIS — Z7952 Long term (current) use of systemic steroids: Secondary | ICD-10-CM | POA: Diagnosis not present

## 2016-11-11 DIAGNOSIS — I10 Essential (primary) hypertension: Secondary | ICD-10-CM | POA: Diagnosis not present

## 2016-11-11 DIAGNOSIS — Z9981 Dependence on supplemental oxygen: Secondary | ICD-10-CM | POA: Diagnosis not present

## 2016-11-11 DIAGNOSIS — Z8679 Personal history of other diseases of the circulatory system: Secondary | ICD-10-CM | POA: Diagnosis not present

## 2016-11-11 DIAGNOSIS — I2510 Atherosclerotic heart disease of native coronary artery without angina pectoris: Secondary | ICD-10-CM | POA: Diagnosis not present

## 2016-11-11 DIAGNOSIS — J189 Pneumonia, unspecified organism: Secondary | ICD-10-CM | POA: Diagnosis not present

## 2016-11-11 DIAGNOSIS — E1151 Type 2 diabetes mellitus with diabetic peripheral angiopathy without gangrene: Secondary | ICD-10-CM | POA: Diagnosis not present

## 2016-11-11 DIAGNOSIS — Z86718 Personal history of other venous thrombosis and embolism: Secondary | ICD-10-CM | POA: Diagnosis not present

## 2016-11-11 DIAGNOSIS — Z7984 Long term (current) use of oral hypoglycemic drugs: Secondary | ICD-10-CM | POA: Diagnosis not present

## 2016-11-11 DIAGNOSIS — R55 Syncope and collapse: Secondary | ICD-10-CM | POA: Diagnosis not present

## 2016-11-16 DIAGNOSIS — E1151 Type 2 diabetes mellitus with diabetic peripheral angiopathy without gangrene: Secondary | ICD-10-CM | POA: Diagnosis not present

## 2016-11-16 DIAGNOSIS — Z7952 Long term (current) use of systemic steroids: Secondary | ICD-10-CM | POA: Diagnosis not present

## 2016-11-16 DIAGNOSIS — Z8673 Personal history of transient ischemic attack (TIA), and cerebral infarction without residual deficits: Secondary | ICD-10-CM | POA: Diagnosis not present

## 2016-11-16 DIAGNOSIS — Z9981 Dependence on supplemental oxygen: Secondary | ICD-10-CM | POA: Diagnosis not present

## 2016-11-16 DIAGNOSIS — J449 Chronic obstructive pulmonary disease, unspecified: Secondary | ICD-10-CM | POA: Diagnosis not present

## 2016-11-16 DIAGNOSIS — J189 Pneumonia, unspecified organism: Secondary | ICD-10-CM | POA: Diagnosis not present

## 2016-11-16 DIAGNOSIS — I2510 Atherosclerotic heart disease of native coronary artery without angina pectoris: Secondary | ICD-10-CM | POA: Diagnosis not present

## 2016-11-16 DIAGNOSIS — Z8679 Personal history of other diseases of the circulatory system: Secondary | ICD-10-CM | POA: Diagnosis not present

## 2016-11-16 DIAGNOSIS — I10 Essential (primary) hypertension: Secondary | ICD-10-CM | POA: Diagnosis not present

## 2016-11-16 DIAGNOSIS — R55 Syncope and collapse: Secondary | ICD-10-CM | POA: Diagnosis not present

## 2016-11-16 DIAGNOSIS — Z86718 Personal history of other venous thrombosis and embolism: Secondary | ICD-10-CM | POA: Diagnosis not present

## 2016-11-16 DIAGNOSIS — M109 Gout, unspecified: Secondary | ICD-10-CM | POA: Diagnosis not present

## 2016-11-16 DIAGNOSIS — Z7984 Long term (current) use of oral hypoglycemic drugs: Secondary | ICD-10-CM | POA: Diagnosis not present

## 2016-11-22 DIAGNOSIS — Z7984 Long term (current) use of oral hypoglycemic drugs: Secondary | ICD-10-CM | POA: Diagnosis not present

## 2016-11-22 DIAGNOSIS — Z8673 Personal history of transient ischemic attack (TIA), and cerebral infarction without residual deficits: Secondary | ICD-10-CM | POA: Diagnosis not present

## 2016-11-22 DIAGNOSIS — J449 Chronic obstructive pulmonary disease, unspecified: Secondary | ICD-10-CM | POA: Diagnosis not present

## 2016-11-22 DIAGNOSIS — E1151 Type 2 diabetes mellitus with diabetic peripheral angiopathy without gangrene: Secondary | ICD-10-CM | POA: Diagnosis not present

## 2016-11-22 DIAGNOSIS — J189 Pneumonia, unspecified organism: Secondary | ICD-10-CM | POA: Diagnosis not present

## 2016-11-22 DIAGNOSIS — M109 Gout, unspecified: Secondary | ICD-10-CM | POA: Diagnosis not present

## 2016-11-22 DIAGNOSIS — Z7952 Long term (current) use of systemic steroids: Secondary | ICD-10-CM | POA: Diagnosis not present

## 2016-11-22 DIAGNOSIS — I10 Essential (primary) hypertension: Secondary | ICD-10-CM | POA: Diagnosis not present

## 2016-11-22 DIAGNOSIS — Z8679 Personal history of other diseases of the circulatory system: Secondary | ICD-10-CM | POA: Diagnosis not present

## 2016-11-22 DIAGNOSIS — I2510 Atherosclerotic heart disease of native coronary artery without angina pectoris: Secondary | ICD-10-CM | POA: Diagnosis not present

## 2016-11-22 DIAGNOSIS — Z9981 Dependence on supplemental oxygen: Secondary | ICD-10-CM | POA: Diagnosis not present

## 2016-11-22 DIAGNOSIS — R55 Syncope and collapse: Secondary | ICD-10-CM | POA: Diagnosis not present

## 2016-11-22 DIAGNOSIS — Z86718 Personal history of other venous thrombosis and embolism: Secondary | ICD-10-CM | POA: Diagnosis not present

## 2016-11-24 DIAGNOSIS — R55 Syncope and collapse: Secondary | ICD-10-CM | POA: Diagnosis not present

## 2016-11-24 DIAGNOSIS — M109 Gout, unspecified: Secondary | ICD-10-CM | POA: Diagnosis not present

## 2016-11-24 DIAGNOSIS — Z9981 Dependence on supplemental oxygen: Secondary | ICD-10-CM | POA: Diagnosis not present

## 2016-11-24 DIAGNOSIS — I2510 Atherosclerotic heart disease of native coronary artery without angina pectoris: Secondary | ICD-10-CM | POA: Diagnosis not present

## 2016-11-24 DIAGNOSIS — I10 Essential (primary) hypertension: Secondary | ICD-10-CM | POA: Diagnosis not present

## 2016-11-24 DIAGNOSIS — Z86718 Personal history of other venous thrombosis and embolism: Secondary | ICD-10-CM | POA: Diagnosis not present

## 2016-11-24 DIAGNOSIS — E1151 Type 2 diabetes mellitus with diabetic peripheral angiopathy without gangrene: Secondary | ICD-10-CM | POA: Diagnosis not present

## 2016-11-24 DIAGNOSIS — Z7984 Long term (current) use of oral hypoglycemic drugs: Secondary | ICD-10-CM | POA: Diagnosis not present

## 2016-11-24 DIAGNOSIS — Z7952 Long term (current) use of systemic steroids: Secondary | ICD-10-CM | POA: Diagnosis not present

## 2016-11-24 DIAGNOSIS — J189 Pneumonia, unspecified organism: Secondary | ICD-10-CM | POA: Diagnosis not present

## 2016-11-24 DIAGNOSIS — Z8673 Personal history of transient ischemic attack (TIA), and cerebral infarction without residual deficits: Secondary | ICD-10-CM | POA: Diagnosis not present

## 2016-11-24 DIAGNOSIS — J449 Chronic obstructive pulmonary disease, unspecified: Secondary | ICD-10-CM | POA: Diagnosis not present

## 2016-11-24 DIAGNOSIS — Z8679 Personal history of other diseases of the circulatory system: Secondary | ICD-10-CM | POA: Diagnosis not present

## 2016-12-10 DIAGNOSIS — J449 Chronic obstructive pulmonary disease, unspecified: Secondary | ICD-10-CM | POA: Diagnosis not present

## 2016-12-20 ENCOUNTER — Encounter: Payer: Self-pay | Admitting: Nurse Practitioner

## 2016-12-20 ENCOUNTER — Ambulatory Visit (INDEPENDENT_AMBULATORY_CARE_PROVIDER_SITE_OTHER): Payer: Medicare Other | Admitting: Nurse Practitioner

## 2016-12-20 VITALS — BP 166/88 | HR 95 | Temp 98.0°F | Ht 70.0 in | Wt 145.0 lb

## 2016-12-20 DIAGNOSIS — K219 Gastro-esophageal reflux disease without esophagitis: Secondary | ICD-10-CM | POA: Diagnosis not present

## 2016-12-20 DIAGNOSIS — I509 Heart failure, unspecified: Secondary | ICD-10-CM

## 2016-12-20 DIAGNOSIS — I2510 Atherosclerotic heart disease of native coronary artery without angina pectoris: Secondary | ICD-10-CM

## 2016-12-20 DIAGNOSIS — I739 Peripheral vascular disease, unspecified: Secondary | ICD-10-CM | POA: Diagnosis not present

## 2016-12-20 DIAGNOSIS — E119 Type 2 diabetes mellitus without complications: Secondary | ICD-10-CM

## 2016-12-20 DIAGNOSIS — I10 Essential (primary) hypertension: Secondary | ICD-10-CM | POA: Diagnosis not present

## 2016-12-20 DIAGNOSIS — Z125 Encounter for screening for malignant neoplasm of prostate: Secondary | ICD-10-CM

## 2016-12-20 DIAGNOSIS — E1069 Type 1 diabetes mellitus with other specified complication: Secondary | ICD-10-CM | POA: Diagnosis not present

## 2016-12-20 DIAGNOSIS — Z7289 Other problems related to lifestyle: Secondary | ICD-10-CM | POA: Diagnosis not present

## 2016-12-20 DIAGNOSIS — I25810 Atherosclerosis of coronary artery bypass graft(s) without angina pectoris: Secondary | ICD-10-CM

## 2016-12-20 DIAGNOSIS — E785 Hyperlipidemia, unspecified: Secondary | ICD-10-CM

## 2016-12-20 DIAGNOSIS — E1159 Type 2 diabetes mellitus with other circulatory complications: Secondary | ICD-10-CM

## 2016-12-20 DIAGNOSIS — Z23 Encounter for immunization: Secondary | ICD-10-CM | POA: Diagnosis not present

## 2016-12-20 DIAGNOSIS — J449 Chronic obstructive pulmonary disease, unspecified: Secondary | ICD-10-CM

## 2016-12-20 DIAGNOSIS — F5101 Primary insomnia: Secondary | ICD-10-CM

## 2016-12-20 DIAGNOSIS — F3342 Major depressive disorder, recurrent, in full remission: Secondary | ICD-10-CM

## 2016-12-20 DIAGNOSIS — F17200 Nicotine dependence, unspecified, uncomplicated: Secondary | ICD-10-CM | POA: Diagnosis not present

## 2016-12-20 LAB — BAYER DCA HB A1C WAIVED: HB A1C (BAYER DCA - WAIVED): 7.3 % — ABNORMAL HIGH (ref <7.0)

## 2016-12-20 MED ORDER — CILOSTAZOL 100 MG PO TABS: 100.0000 mg | ORAL_TABLET | Freq: Two times a day (BID) | ORAL | 1 refills | Status: AC

## 2016-12-20 MED ORDER — OMEPRAZOLE 20 MG PO CPDR: 20.0000 mg | DELAYED_RELEASE_CAPSULE | Freq: Every day | ORAL | 1 refills | Status: AC

## 2016-12-20 MED ORDER — METFORMIN HCL 500 MG PO TABS: 500.0000 mg | ORAL_TABLET | Freq: Two times a day (BID) | ORAL | 1 refills | Status: AC

## 2016-12-20 MED ORDER — ATORVASTATIN CALCIUM 40 MG PO TABS: 40.0000 mg | ORAL_TABLET | Freq: Every day | ORAL | 1 refills | Status: AC

## 2016-12-20 MED ORDER — MIRTAZAPINE 30 MG PO TABS: ORAL_TABLET | ORAL | 1 refills | Status: AC

## 2016-12-20 MED ORDER — CITALOPRAM HYDROBROMIDE 20 MG PO TABS: 20.0000 mg | ORAL_TABLET | Freq: Every day | ORAL | 1 refills | Status: AC

## 2016-12-20 MED ORDER — FUROSEMIDE 20 MG PO TABS: 20.0000 mg | ORAL_TABLET | Freq: Every day | ORAL | 3 refills | Status: AC

## 2016-12-20 MED ORDER — IPRATROPIUM-ALBUTEROL 0.5-2.5 (3) MG/3ML IN SOLN: 3.0000 mL | RESPIRATORY_TRACT | 0 refills | Status: AC | PRN

## 2016-12-20 MED ORDER — METOPROLOL TARTRATE 25 MG PO TABS: 12.5000 mg | ORAL_TABLET | Freq: Two times a day (BID) | ORAL | 1 refills | Status: AC

## 2016-12-20 MED ORDER — MIRTAZAPINE 30 MG PO TABS: ORAL_TABLET | ORAL | 1 refills | Status: DC

## 2016-12-20 NOTE — Addendum Note (Signed)
Addended by: Rolena Infante on: 12/20/2016 05:14 PM   Modules accepted: Orders

## 2016-12-20 NOTE — Patient Instructions (Signed)
Heart Failure  Heart failure means your heart has trouble pumping blood. This makes it hard for your body to work well. Heart failure is usually a long-term (chronic) condition. You must take good care of yourself and follow your doctor's treatment plan.  HOME CARE   Take your heart medicine as told by your doctor.    Do not stop taking medicine unless your doctor tells you to.    Do not skip any dose of medicine.    Refill your medicines before they run out.    Take other medicines only as told by your doctor or pharmacist.   Stay active if told by your doctor. The elderly and people with severe heart failure should talk with a doctor about physical activity.   Eat heart-healthy foods. Choose foods that are without trans fat and are low in saturated fat, cholesterol, and salt (sodium). This includes fresh or frozen fruits and vegetables, fish, lean meats, fat-free or low-fat dairy foods, whole grains, and high-fiber foods. Lentils and dried peas and beans (legumes) are also good choices.   Limit salt if told by your doctor.   Cook in a healthy way. Roast, grill, broil, bake, poach, steam, or stir-fry foods.   Limit fluids as told by your doctor.   Weigh yourself every morning. Do this after you pee (urinate) and before you eat breakfast. Write down your weight to give to your doctor.   Take your blood pressure and write it down if your doctor tells you to.   Ask your doctor how to check your pulse. Check your pulse as told.   Lose weight if told by your doctor.   Stop smoking or chewing tobacco. Do not use gum or patches that help you quit without your doctor's approval.   Schedule and go to doctor visits as told.   Nonpregnant women should have no more than 1 drink a day. Men should have no more than 2 drinks a day. Talk to your doctor about drinking alcohol.   Stop illegal drug use.   Stay current with shots (immunizations).   Manage your health conditions as told by your doctor.   Learn to  manage your stress.   Rest when you are tired.   If it is really hot outside:    Avoid intense activities.    Use air conditioning or fans, or get in a cooler place.    Avoid caffeine and alcohol.    Wear loose-fitting, lightweight, and light-colored clothing.   If it is really cold outside:    Avoid intense activities.    Layer your clothing.    Wear mittens or gloves, a hat, and a scarf when going outside.    Avoid alcohol.   Learn about heart failure and get support as needed.   Get help to maintain or improve your quality of life and your ability to care for yourself as needed.  GET HELP IF:    You gain weight quickly.   You are more short of breath than usual.   You cannot do your normal activities.   You tire easily.   You cough more than normal, especially with activity.   You have any or more puffiness (swelling) in areas such as your hands, feet, ankles, or belly (abdomen).   You cannot sleep because it is hard to breathe.   You feel like your heart is beating fast (palpitations).   You get dizzy or light-headed when you stand up.  GET HELP   RIGHT AWAY IF:    You have trouble breathing.   There is a change in mental status, such as becoming less alert or not being able to focus.   You have chest pain or discomfort.   You faint.  MAKE SURE YOU:    Understand these instructions.   Will watch your condition.   Will get help right away if you are not doing well or get worse.     This information is not intended to replace advice given to you by your health care provider. Make sure you discuss any questions you have with your health care provider.     Document Released: 08/16/2008 Document Revised: 11/28/2014 Document Reviewed: 12/24/2012  Elsevier Interactive Patient Education 2017 Elsevier Inc[employer]

## 2016-12-20 NOTE — Progress Notes (Signed)
Subjective:    Patient ID: Perry Kennedy, male    DOB: [birth year], 76 y.o.   MRN: [id number]  Patient here today for follow up of chronic medical problems. No changes since last visit. No complaints today.  Outpatient Encounter Prescriptions as of 12/20/2016  Medication Sig  . atorvastatin (LIPITOR) 40 MG tablet TAKE ONE TABLET BY MOUTH ONCE DAILY  . cilostazol (PLETAL) 100 MG tablet TAKE ONE TABLET BY MOUTH TWICE DAILY  . citalopram (CELEXA) 20 MG tablet TAKE ONE TABLET BY MOUTH ONCE DAILY  . clopidogrel (PLAVIX) 75 MG tablet TAKE ONE TABLET BY MOUTH ONCE DAILY  . ipratropium-albuterol (DUONEB) 0.5-2.5 (3) MG/3ML SOLN Take 3 mLs by nebulization every 4 (four) hours as needed. Diagnosis: J44.9, J96.01  . metFORMIN (GLUCOPHAGE) 500 MG tablet TAKE ONE TABLET BY MOUTH TWICE DAILY WITH MEALS  . metoprolol tartrate (LOPRESSOR) 25 MG tablet Take 0.5 tablets (12.5 mg total) by mouth 2 (two) times daily.  . mirtazapine (REMERON) 30 MG tablet TAKE ONE TABLET BY MOUTH ONCE DAILY WITH BREAKFAST  . omeprazole (PRILOSEC) 20 MG capsule TAKE ONE CAPSULE BY MOUTH ONCE DAILY  . ranitidine (ZANTAC) 150 MG tablet Take 1 tablet (150 mg total) by mouth 2 (two) times daily.  . [DISCONTINUED] predniSONE (DELTASONE) 20 MG tablet Take 2 tabs po daily with breakfast for 5 days, then take 1 tab po daily for 5 days, then stop   No facility-administered encounter medications on file as of 12/20/2016.     Hypertension  This is a chronic problem. The current episode started more than 1 year ago. The problem has been waxing and waning since onset. Pertinent negatives include no chest pain or headaches. Risk factors for coronary artery disease include diabetes mellitus, dyslipidemia, obesity and male gender. Past treatments include ACE inhibitors. The current treatment provides moderate improvement. Hypertensive end-organ damage includes CAD/MI and PVD.  Hyperlipidemia  This is a chronic problem. The current episode  started more than 1 year ago. Recent lipid tests were reviewed and are variable. Exacerbating diseases include diabetes and obesity. He has no history of hypothyroidism. Pertinent negatives include no chest pain. Current antihyperlipidemic treatment includes statins. The current treatment provides moderate improvement of lipids. Compliance problems include adherence to diet and adherence to exercise.  Risk factors for coronary artery disease include dyslipidemia, hypertension, male sex and obesity.  Diabetes  He presents for his follow-up diabetic visit. His disease course has been fluctuating. Pertinent negatives for hypoglycemia include no dizziness or headaches. Pertinent negatives for diabetes include no chest pain, no fatigue, no foot paresthesias, no polydipsia and no polyphagia. Diabetic complications include PVD. Risk factors for coronary artery disease include diabetes mellitus, dyslipidemia, hypertension, male sex and obesity. Current diabetic treatment includes oral agent (monotherapy). He is compliant with treatment some of the time. His weight is stable. When asked about meal planning, he reported none. He has not had a previous visit with a dietitian. He rarely participates in exercise. There is no change in his home blood glucose trend. His breakfast blood glucose is taken between 9-10 am. His breakfast blood glucose range is generally 140-180 mg/dl. His overall blood glucose range is 140-180 mg/dl. (Has not been checking blood sugars every day) An ACE inhibitor/angiotensin II receptor blocker is not being taken. He does not see a podiatrist.Eye exam is not current.  CAD/PVD See cardiovascular surgeon every 6 months- no recent problems- on pletal without probems GERD On zantac daily which keeps symptoms under control Insomnia remeron  nightly helps him rest well- no c/o fatigue when awakening. Does not always take both meds at bedtime. Depression Currently on celexa- no c/o side effects-  says that keeps him from feeling down. 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 He does however continue to smoke.     Review of Systems  Constitutional: Negative for fatigue.  HENT: Negative.   Respiratory: Negative.   Cardiovascular: Negative.  Negative for chest pain.  Gastrointestinal: Negative.   Endocrine: Negative for polydipsia and polyphagia.  Genitourinary: Negative.   Neurological: Negative.  Negative for dizziness and headaches.  Psychiatric/Behavioral: Negative.   All other systems reviewed and are negative.      Objective:   Physical Exam  Constitutional: He is oriented to person, place, and time. He appears well-nourished.  HENT:  Head: Normocephalic.  Right Ear: External ear normal.  Left Ear: External ear normal.  Nose: Nose normal.  Mouth/Throat: Oropharynx is clear and moist.  Eyes: EOM are normal. Pupils are equal, round, and reactive to light.  Neck: Normal range of motion. Neck supple. No JVD present. No thyromegaly present.  Cardiovascular: Normal rate, regular rhythm, normal heart sounds and intact distal pulses.  Exam reveals no gallop and no friction rub.   No murmur heard. Pulmonary/Chest: Effort normal and breath sounds normal. No respiratory distress. He has no wheezes. He has no rales. He exhibits no tenderness.  Abdominal: Soft. Bowel sounds are normal. He exhibits no mass. There is no tenderness.  Genitourinary: Prostate normal and penis normal.  Musculoskeletal: Normal range of motion. He exhibits no edema.  Lymphadenopathy:    He has no cervical adenopathy.  Neurological: He is alert and oriented to person, place, and time. No cranial nerve deficit.  Skin: Skin is warm and dry.  Psychiatric: He has a normal mood and affect. His behavior is normal. Judgment  and thought content normal.    BP (!) 166/88   Pulse 95   Temp 98 F (36.7 C) (Oral)   Ht 5' 10"  (1.778 m)   Wt 145 lb (65.8 kg)   BMI 20.81 kg/m  hgba1c- 7.3% up from 6.7% at last visit    Assessment & Plan:  1. Essential hypertension Low sodium diet - CMP14+EGFR  2. Type 2 diabetes mellitus without complication, without long-term current use of insulin (HCC) Continue to watch carbs in diet - Bayer DCA Hb A1c Waived - Microalbumin / creatinine urine ratio - metFORMIN (GLUCOPHAGE) 500 MG tablet; Take 1 tablet (500 mg total) by mouth 2 (two) times daily with a meal.  Dispense: 180 tablet; Refill: 1  3. Hyperlipidemia due to type 1 diabetes mellitus (HCC) Low fat diet - Lipid panel - atorvastatin (LIPITOR) 40 MG tablet; Take 1 tablet (40 mg total) by mouth daily.  Dispense: 90 tablet; Refill: 1  4. Coronary artery disease involving coronary bypass graft of native heart without angina pectoris Keep follow up with cardiology - metoprolol tartrate (LOPRESSOR) 25 MG tablet; Take 0.5 tablets (12.5 mg total) by mouth 2 (two) times daily.  Dispense: 60 tablet; Refill: 1  5. Peripheral vascular disease ([place])  6. Type 2 diabetes mellitus with vascular disease ([place])  7. COPD, severe ([place]) - ipratropium-albuterol (DUONEB) 0.5-2.5 (3) MG/3ML SOLN; Take 3 mLs by nebulization every 4 (four) hours as needed. Diagnosis: J44.9, J96.01  Dispense: 360 mL; Refill: 0  8. Recurrent major depressive disorder, in full remission ([place]) Stress management - citalopram (CELEXA) 20 MG tablet; Take 1 tablet (20 mg total) by mouth daily.  Dispense: 90 tablet; Refill: 1  9. SMOKER Smoking cessation encouraged  10. Gastroesophageal reflux disease without esophagitis Avoid spicy foods Do not eat 2 hours prior to bedtime - omeprazole (PRILOSEC) 20 MG capsule; Take 1 capsule (20 mg total) by mouth daily.  Dispense: 90 capsule; Refill: 1  11. Primary insomnia Bedtime routine - mirtazapine (REMERON) 30  MG tablet; TAKE ONE TABLET BY MOUTH ONCE DAILY WITH BREAKFAST  Dispense: 90 tablet; Refill: 1  12. Chronic congestive heart failure, unspecified congestive heart failure type (HCC) - furosemide (LASIX) 20 MG tablet; Take 1 tablet (20 mg total) by mouth daily.  Dispense: 30 tablet; Refill: 3    Labs pending Health maintenance reviewed Diet and exercise encouraged Continue all meds Follow up  In 3 months   [place], FNP

## 2016-12-20 NOTE — Addendum Note (Signed)
Addended by: Chevis Pretty on: 12/20/2016 04:42 PM   Modules accepted: Orders

## 2016-12-21 LAB — LIPID PANEL
CHOL/HDL RATIO: 3.1 ratio (ref 0.0–5.0)
CHOLESTEROL TOTAL: 108 mg/dL (ref 100–199)
HDL: 35 mg/dL — ABNORMAL LOW (ref >39)
LDL Calculated: 50 mg/dL (ref 0–99)
TRIGLYCERIDES: 117 mg/dL (ref 0–149)
VLDL Cholesterol Cal: 23 mg/dL (ref 5–40)

## 2016-12-21 LAB — CMP14+EGFR
ALK PHOS: 214 IU/L — AB (ref 39–117)
ALT: 35 IU/L (ref 0–44)
AST: 47 IU/L — AB (ref 0–40)
Albumin/Globulin Ratio: 1.2 (ref 1.2–2.2)
Albumin: 3.7 g/dL (ref 3.5–4.8)
BUN/Creatinine Ratio: 12 (ref 10–24)
BUN: 9 mg/dL (ref 8–27)
Bilirubin Total: 0.3 mg/dL (ref 0.0–1.2)
CO2: 22 mmol/L (ref 18–29)
Calcium: 9.5 mg/dL (ref 8.6–10.2)
Chloride: 91 mmol/L — ABNORMAL LOW (ref 96–106)
Creatinine, Ser: 0.76 mg/dL (ref 0.76–1.27)
GFR calc Af Amer: 104 mL/min/{1.73_m2} (ref >59)
GFR calc non Af Amer: 90 mL/min/{1.73_m2} (ref >59)
GLOBULIN, TOTAL: 3.1 g/dL (ref 1.5–4.5)
Glucose: 179 mg/dL — ABNORMAL HIGH (ref 65–99)
POTASSIUM: 4.9 mmol/L (ref 3.5–5.2)
SODIUM: 130 mmol/L — AB (ref 134–144)
Total Protein: 6.8 g/dL (ref 6.0–8.5)

## 2016-12-21 LAB — MICROALBUMIN / CREATININE URINE RATIO
Creatinine, Urine: 80.1 mg/dL
MICROALB/CREAT RATIO: 1080.8 mg/g{creat} — AB (ref 0.0–30.0)
MICROALBUM., U, RANDOM: 865.7 ug/mL

## 2016-12-21 LAB — PSA, TOTAL AND FREE
PSA FREE PCT: 27.1 %
PSA FREE: 0.92 ng/mL
Prostate Specific Ag, Serum: 3.4 ng/mL (ref 0.0–4.0)

## 2016-12-21 LAB — BRAIN NATRIURETIC PEPTIDE: BNP: 38.5 pg/mL (ref 0.0–100.0)

## 2016-12-22 ENCOUNTER — Other Ambulatory Visit: Payer: Self-pay | Admitting: Nurse Practitioner

## 2016-12-22 MED ORDER — LISINOPRIL 10 MG PO TABS: 10.0000 mg | ORAL_TABLET | Freq: Every day | ORAL | 5 refills | Status: DC

## 2016-12-23 LAB — HEPATITIS PANEL, ACUTE
HEP C VIRUS AB: 0.1 {s_co_ratio} (ref 0.0–0.9)
Hep A IgM: NEGATIVE
Hep B C IgM: NEGATIVE
Hepatitis B Surface Ag: NEGATIVE

## 2016-12-23 LAB — SPECIMEN STATUS REPORT

## 2016-12-26 NOTE — Telephone Encounter (Signed)
error 

## 2017-01-10 DIAGNOSIS — J449 Chronic obstructive pulmonary disease, unspecified: Secondary | ICD-10-CM | POA: Diagnosis not present

## 2017-02-07 DIAGNOSIS — J449 Chronic obstructive pulmonary disease, unspecified: Secondary | ICD-10-CM | POA: Diagnosis not present

## 2017-03-02 ENCOUNTER — Other Ambulatory Visit: Payer: Self-pay | Admitting: *Deleted

## 2017-03-02 MED ORDER — LISINOPRIL 10 MG PO TABS: 10.0000 mg | ORAL_TABLET | Freq: Every day | ORAL | 1 refills | Status: AC

## 2017-03-06 ENCOUNTER — Emergency Department (HOSPITAL_COMMUNITY): Payer: Medicare Other

## 2017-03-06 ENCOUNTER — Inpatient Hospital Stay (HOSPITAL_COMMUNITY): Payer: Medicare Other

## 2017-03-06 ENCOUNTER — Inpatient Hospital Stay (HOSPITAL_COMMUNITY)
Admission: EM | Admit: 2017-03-06 | Discharge: 2017-03-21 | DRG: 871 | Disposition: E | Payer: Medicare Other | Attending: Emergency Medicine | Admitting: Emergency Medicine

## 2017-03-06 ENCOUNTER — Encounter (HOSPITAL_COMMUNITY): Payer: Self-pay | Admitting: Emergency Medicine

## 2017-03-06 DIAGNOSIS — Z86718 Personal history of other venous thrombosis and embolism: Secondary | ICD-10-CM | POA: Diagnosis not present

## 2017-03-06 DIAGNOSIS — K8020 Calculus of gallbladder without cholecystitis without obstruction: Secondary | ICD-10-CM | POA: Diagnosis not present

## 2017-03-06 DIAGNOSIS — R740 Nonspecific elevation of levels of transaminase and lactic acid dehydrogenase [LDH]: Secondary | ICD-10-CM

## 2017-03-06 DIAGNOSIS — R6521 Severe sepsis with septic shock: Secondary | ICD-10-CM | POA: Diagnosis present

## 2017-03-06 DIAGNOSIS — I2510 Atherosclerotic heart disease of native coronary artery without angina pectoris: Secondary | ICD-10-CM | POA: Diagnosis not present

## 2017-03-06 DIAGNOSIS — R0989 Other specified symptoms and signs involving the circulatory and respiratory systems: Secondary | ICD-10-CM | POA: Diagnosis not present

## 2017-03-06 DIAGNOSIS — G9340 Encephalopathy, unspecified: Secondary | ICD-10-CM | POA: Diagnosis present

## 2017-03-06 DIAGNOSIS — Z8673 Personal history of transient ischemic attack (TIA), and cerebral infarction without residual deficits: Secondary | ICD-10-CM

## 2017-03-06 DIAGNOSIS — E1151 Type 2 diabetes mellitus with diabetic peripheral angiopathy without gangrene: Secondary | ICD-10-CM | POA: Diagnosis not present

## 2017-03-06 DIAGNOSIS — J9600 Acute respiratory failure, unspecified whether with hypoxia or hypercapnia: Secondary | ICD-10-CM | POA: Diagnosis not present

## 2017-03-06 DIAGNOSIS — J440 Chronic obstructive pulmonary disease with acute lower respiratory infection: Secondary | ICD-10-CM | POA: Diagnosis present

## 2017-03-06 DIAGNOSIS — R4020 Unspecified coma: Secondary | ICD-10-CM | POA: Diagnosis not present

## 2017-03-06 DIAGNOSIS — E872 Acidosis: Secondary | ICD-10-CM | POA: Diagnosis present

## 2017-03-06 DIAGNOSIS — Z7984 Long term (current) use of oral hypoglycemic drugs: Secondary | ICD-10-CM | POA: Diagnosis not present

## 2017-03-06 DIAGNOSIS — Z7902 Long term (current) use of antithrombotics/antiplatelets: Secondary | ICD-10-CM

## 2017-03-06 DIAGNOSIS — I509 Heart failure, unspecified: Secondary | ICD-10-CM | POA: Diagnosis not present

## 2017-03-06 DIAGNOSIS — A419 Sepsis, unspecified organism: Secondary | ICD-10-CM | POA: Diagnosis not present

## 2017-03-06 DIAGNOSIS — I110 Hypertensive heart disease with heart failure: Secondary | ICD-10-CM | POA: Diagnosis not present

## 2017-03-06 DIAGNOSIS — D689 Coagulation defect, unspecified: Secondary | ICD-10-CM | POA: Diagnosis present

## 2017-03-06 DIAGNOSIS — N179 Acute kidney failure, unspecified: Secondary | ICD-10-CM | POA: Diagnosis not present

## 2017-03-06 DIAGNOSIS — Z9049 Acquired absence of other specified parts of digestive tract: Secondary | ICD-10-CM | POA: Diagnosis not present

## 2017-03-06 DIAGNOSIS — E162 Hypoglycemia, unspecified: Secondary | ICD-10-CM | POA: Diagnosis not present

## 2017-03-06 DIAGNOSIS — Z833 Family history of diabetes mellitus: Secondary | ICD-10-CM

## 2017-03-06 DIAGNOSIS — J189 Pneumonia, unspecified organism: Secondary | ICD-10-CM

## 2017-03-06 DIAGNOSIS — N170 Acute kidney failure with tubular necrosis: Secondary | ICD-10-CM | POA: Diagnosis not present

## 2017-03-06 DIAGNOSIS — K219 Gastro-esophageal reflux disease without esophagitis: Secondary | ICD-10-CM | POA: Diagnosis not present

## 2017-03-06 DIAGNOSIS — Z79899 Other long term (current) drug therapy: Secondary | ICD-10-CM | POA: Diagnosis not present

## 2017-03-06 DIAGNOSIS — J9691 Respiratory failure, unspecified with hypoxia: Secondary | ICD-10-CM | POA: Diagnosis present

## 2017-03-06 DIAGNOSIS — D649 Anemia, unspecified: Secondary | ICD-10-CM | POA: Diagnosis present

## 2017-03-06 DIAGNOSIS — E11649 Type 2 diabetes mellitus with hypoglycemia without coma: Secondary | ICD-10-CM | POA: Diagnosis not present

## 2017-03-06 DIAGNOSIS — J811 Chronic pulmonary edema: Secondary | ICD-10-CM | POA: Diagnosis not present

## 2017-03-06 DIAGNOSIS — Z86010 Personal history of colonic polyps: Secondary | ICD-10-CM

## 2017-03-06 DIAGNOSIS — K7291 Hepatic failure, unspecified with coma: Secondary | ICD-10-CM | POA: Diagnosis not present

## 2017-03-06 DIAGNOSIS — Z8249 Family history of ischemic heart disease and other diseases of the circulatory system: Secondary | ICD-10-CM

## 2017-03-06 DIAGNOSIS — Z9889 Other specified postprocedural states: Secondary | ICD-10-CM | POA: Diagnosis not present

## 2017-03-06 DIAGNOSIS — F17210 Nicotine dependence, cigarettes, uncomplicated: Secondary | ICD-10-CM | POA: Diagnosis present

## 2017-03-06 DIAGNOSIS — Z885 Allergy status to narcotic agent status: Secondary | ICD-10-CM

## 2017-03-06 DIAGNOSIS — R680 Hypothermia, not associated with low environmental temperature: Secondary | ICD-10-CM | POA: Diagnosis not present

## 2017-03-06 DIAGNOSIS — K7201 Acute and subacute hepatic failure with coma: Secondary | ICD-10-CM | POA: Diagnosis present

## 2017-03-06 DIAGNOSIS — Z8711 Personal history of peptic ulcer disease: Secondary | ICD-10-CM

## 2017-03-06 DIAGNOSIS — Z825 Family history of asthma and other chronic lower respiratory diseases: Secondary | ICD-10-CM

## 2017-03-06 DIAGNOSIS — R160 Hepatomegaly, not elsewhere classified: Secondary | ICD-10-CM | POA: Diagnosis not present

## 2017-03-06 DIAGNOSIS — Z4682 Encounter for fitting and adjustment of non-vascular catheter: Secondary | ICD-10-CM | POA: Diagnosis not present

## 2017-03-06 DIAGNOSIS — Z4659 Encounter for fitting and adjustment of other gastrointestinal appliance and device: Secondary | ICD-10-CM

## 2017-03-06 DIAGNOSIS — R4182 Altered mental status, unspecified: Secondary | ICD-10-CM

## 2017-03-06 DIAGNOSIS — R0602 Shortness of breath: Secondary | ICD-10-CM | POA: Diagnosis not present

## 2017-03-06 DIAGNOSIS — R569 Unspecified convulsions: Secondary | ICD-10-CM

## 2017-03-06 DIAGNOSIS — Z66 Do not resuscitate: Secondary | ICD-10-CM | POA: Diagnosis not present

## 2017-03-06 DIAGNOSIS — Z801 Family history of malignant neoplasm of trachea, bronchus and lung: Secondary | ICD-10-CM

## 2017-03-06 DIAGNOSIS — E875 Hyperkalemia: Secondary | ICD-10-CM | POA: Diagnosis present

## 2017-03-06 DIAGNOSIS — R197 Diarrhea, unspecified: Secondary | ICD-10-CM | POA: Diagnosis present

## 2017-03-06 DIAGNOSIS — Z452 Encounter for adjustment and management of vascular access device: Secondary | ICD-10-CM | POA: Diagnosis not present

## 2017-03-06 DIAGNOSIS — R7401 Elevation of levels of liver transaminase levels: Secondary | ICD-10-CM

## 2017-03-06 LAB — URINALYSIS, ROUTINE W REFLEX MICROSCOPIC
Bilirubin Urine: NEGATIVE
GLUCOSE, UA: NEGATIVE mg/dL
Ketones, ur: NEGATIVE mg/dL
LEUKOCYTES UA: NEGATIVE
NITRITE: NEGATIVE
PH: 5 (ref 5.0–8.0)
PROTEIN: NEGATIVE mg/dL
Specific Gravity, Urine: 1.015 (ref 1.005–1.030)

## 2017-03-06 LAB — COMPREHENSIVE METABOLIC PANEL
ALT: 442 U/L — ABNORMAL HIGH (ref 17–63)
AST: 1996 U/L — ABNORMAL HIGH (ref 15–41)
Albumin: 1.4 g/dL — ABNORMAL LOW (ref 3.5–5.0)
Alkaline Phosphatase: 690 U/L — ABNORMAL HIGH (ref 38–126)
BUN: 51 mg/dL — ABNORMAL HIGH (ref 6–20)
CO2: <7 mmol/L — ABNORMAL LOW (ref 22–32)
Calcium: 7.6 mg/dL — ABNORMAL LOW (ref 8.9–10.3)
Chloride: 95 mmol/L — ABNORMAL LOW (ref 101–111)
Creatinine, Ser: 3.16 mg/dL — ABNORMAL HIGH (ref 0.61–1.24)
GFR calc Af Amer: 21 mL/min — ABNORMAL LOW (ref >60)
GFR calc non Af Amer: 18 mL/min — ABNORMAL LOW (ref >60)
Glucose, Bld: 115 mg/dL — ABNORMAL HIGH (ref 65–99)
Potassium: 6.8 mmol/L (ref 3.5–5.1)
Sodium: 128 mmol/L — ABNORMAL LOW (ref 135–145)
Total Bilirubin: 6 mg/dL — ABNORMAL HIGH (ref 0.3–1.2)
Total Protein: 4.3 g/dL — ABNORMAL LOW (ref 6.5–8.1)

## 2017-03-06 LAB — I-STAT CG4 LACTIC ACID, ED: Lactic Acid, Venous: >17 mmol/L (ref 0.5–1.9)

## 2017-03-06 LAB — CK: Total CK: 124 U/L (ref 49–397)

## 2017-03-06 LAB — CBC
HCT: 28.3 % — ABNORMAL LOW (ref 39.0–52.0)
Hemoglobin: 8.2 g/dL — ABNORMAL LOW (ref 13.0–17.0)
MCH: 20.8 pg — ABNORMAL LOW (ref 26.0–34.0)
MCHC: 29 g/dL — ABNORMAL LOW (ref 30.0–36.0)
MCV: 71.8 fL — ABNORMAL LOW (ref 78.0–100.0)
Platelets: 197 10*3/uL (ref 150–400)
RBC: 3.94 MIL/uL — ABNORMAL LOW (ref 4.22–5.81)
RDW: 20.1 % — ABNORMAL HIGH (ref 11.5–15.5)
WBC: 17.9 10*3/uL — ABNORMAL HIGH (ref 4.0–10.5)

## 2017-03-06 LAB — PROTIME-INR
INR: 4.57
Prothrombin Time: 47.6 seconds — ABNORMAL HIGH (ref 11.4–15.2)

## 2017-03-06 LAB — ACETAMINOPHEN LEVEL

## 2017-03-06 LAB — POC OCCULT BLOOD, ED: FECAL OCCULT BLD: POSITIVE — AB

## 2017-03-06 LAB — SALICYLATE LEVEL: Salicylate Lvl: 7.7 mg/dL (ref 2.8–30.0)

## 2017-03-06 LAB — CBG MONITORING, ED
GLUCOSE-CAPILLARY: 103 mg/dL — AB (ref 65–99)
Glucose-Capillary: 87 mg/dL (ref 65–99)
Glucose-Capillary: 89 mg/dL (ref 65–99)

## 2017-03-06 LAB — ETHANOL: ALCOHOL ETHYL (B): 42 mg/dL — AB (ref <5)

## 2017-03-06 MED ORDER — MIDAZOLAM HCL 2 MG/2ML IJ SOLN: 1.0000 mg | INTRAMUSCULAR | Status: DC | PRN

## 2017-03-06 MED ORDER — FENTANYL CITRATE (PF) 100 MCG/2ML IJ SOLN: 50.0000 ug | Freq: Once | INTRAMUSCULAR | Status: DC

## 2017-03-06 MED ORDER — FENTANYL BOLUS VIA INFUSION
25.0000 ug | INTRAVENOUS | Status: DC | PRN
  Filled 2017-03-06: qty 25

## 2017-03-06 MED ORDER — PANTOPRAZOLE SODIUM 40 MG IV SOLR
40.0000 mg | Freq: Every day | INTRAVENOUS | Status: DC
  Administered 2017-03-06: 40 mg via INTRAVENOUS
  Filled 2017-03-06: qty 40

## 2017-03-06 MED ORDER — NOREPINEPHRINE BITARTRATE 1 MG/ML IV SOLN
0.0000 ug/min | INTRAVENOUS | Status: DC
  Administered 2017-03-07 (×2): 50 ug/min via INTRAVENOUS
  Filled 2017-03-06 (×3): qty 16

## 2017-03-06 MED ORDER — NOREPINEPHRINE BITARTRATE 1 MG/ML IV SOLN
0.0000 ug/min | Freq: Once | INTRAVENOUS | Status: AC
  Administered 2017-03-06: 10 ug/min via INTRAVENOUS
  Administered 2017-03-06: 40 ug/min via INTRAVENOUS

## 2017-03-06 MED ORDER — CALCIUM CHLORIDE 10 % IV SOLN
1.0000 g | Freq: Once | INTRAVENOUS | Status: AC
  Administered 2017-03-06: 1 g via INTRAVENOUS
  Filled 2017-03-06: qty 10

## 2017-03-06 MED ORDER — SODIUM BICARBONATE 8.4 % IV SOLN
INTRAVENOUS | Status: DC
  Administered 2017-03-06 – 2017-03-07 (×2): via INTRAVENOUS
  Filled 2017-03-06 (×5): qty 150

## 2017-03-06 MED ORDER — SODIUM CHLORIDE 0.9 % IV SOLN: Freq: Once | INTRAVENOUS | Status: DC

## 2017-03-06 MED ORDER — LORAZEPAM 2 MG/ML IJ SOLN
1.0000 mg | Freq: Once | INTRAMUSCULAR | Status: AC
  Administered 2017-03-06: 1 mg via INTRAVENOUS

## 2017-03-06 MED ORDER — HEPARIN SODIUM (PORCINE) 5000 UNIT/ML IJ SOLN: 5000.0000 [IU] | Freq: Three times a day (TID) | INTRAMUSCULAR | Status: DC

## 2017-03-06 MED ORDER — SODIUM CHLORIDE 0.9 % IV BOLUS (SEPSIS)
500.0000 mL | Freq: Once | INTRAVENOUS | Status: AC
  Administered 2017-03-06: 500 mL via INTRAVENOUS

## 2017-03-06 MED ORDER — SODIUM CHLORIDE 0.9 % IV SOLN
INTRAVENOUS | Status: AC | PRN
  Administered 2017-03-06: 1000 mL via INTRAVENOUS
  Administered 2017-03-06: 2000 mL via INTRAVENOUS

## 2017-03-06 MED ORDER — SODIUM CHLORIDE 0.9 % IV SOLN
INTRAVENOUS | Status: DC | PRN
  Administered 2017-03-06: 1000 mL via INTRAVENOUS

## 2017-03-06 MED ORDER — ETOMIDATE 2 MG/ML IV SOLN
INTRAVENOUS | Status: AC | PRN
  Administered 2017-03-06: 20 mg via INTRAVENOUS

## 2017-03-06 MED ORDER — FENTANYL 2500MCG IN NS 250ML (10MCG/ML) PREMIX INFUSION
25.0000 ug/h | INTRAVENOUS | Status: DC
  Administered 2017-03-06: 50 ug/h via INTRAVENOUS
  Filled 2017-03-06: qty 250

## 2017-03-06 MED ORDER — CLOPIDOGREL BISULFATE 75 MG PO TABS: 75.0000 mg | ORAL_TABLET | Freq: Every day | ORAL | Status: DC

## 2017-03-06 MED ORDER — LORAZEPAM 2 MG/ML IJ SOLN
INTRAMUSCULAR | Status: AC
  Filled 2017-03-06: qty 1

## 2017-03-06 MED ORDER — CILOSTAZOL 100 MG PO TABS: 100.0000 mg | ORAL_TABLET | Freq: Two times a day (BID) | ORAL | Status: DC

## 2017-03-06 MED ORDER — SODIUM CHLORIDE 0.9 % IV SOLN: 250.0000 mL | INTRAVENOUS | Status: DC | PRN

## 2017-03-06 MED ORDER — MIDAZOLAM BOLUS VIA INFUSION
1.0000 mg | INTRAVENOUS | Status: DC | PRN
  Filled 2017-03-06: qty 2

## 2017-03-06 MED ORDER — SODIUM CHLORIDE 0.9 % IV BOLUS (SEPSIS): 30.0000 mL/kg | Freq: Once | INTRAVENOUS | Status: DC

## 2017-03-06 MED ORDER — IPRATROPIUM-ALBUTEROL 0.5-2.5 (3) MG/3ML IN SOLN: 3.0000 mL | Freq: Four times a day (QID) | RESPIRATORY_TRACT | Status: DC

## 2017-03-06 MED ORDER — INSULIN ASPART 100 UNIT/ML ~~LOC~~ SOLN
0.0000 [IU] | SUBCUTANEOUS | Status: DC
  Administered 2017-03-07: 3 [IU] via SUBCUTANEOUS
  Administered 2017-03-07: 8 [IU] via SUBCUTANEOUS
  Administered 2017-03-07: 5 [IU] via SUBCUTANEOUS

## 2017-03-06 MED ORDER — ROCURONIUM BROMIDE 50 MG/5ML IV SOLN
INTRAVENOUS | Status: AC | PRN
  Administered 2017-03-06: 65 mg via INTRAVENOUS

## 2017-03-06 MED ORDER — PIPERACILLIN-TAZOBACTAM IN DEX 2-0.25 GM/50ML IV SOLN
2.2500 g | Freq: Four times a day (QID) | INTRAVENOUS | Status: DC
  Administered 2017-03-07: 2.25 g via INTRAVENOUS
  Filled 2017-03-06 (×4): qty 50

## 2017-03-06 MED ORDER — EPINEPHRINE PF 1 MG/10ML IJ SOSY
PREFILLED_SYRINGE | INTRAMUSCULAR | Status: AC | PRN
  Administered 2017-03-06: 1 mg via INTRAVENOUS

## 2017-03-06 MED ORDER — VANCOMYCIN HCL IN DEXTROSE 1-5 GM/200ML-% IV SOLN: 1000.0000 mg | INTRAVENOUS | Status: DC

## 2017-03-06 MED ORDER — VANCOMYCIN HCL 10 G IV SOLR
1500.0000 mg | Freq: Once | INTRAVENOUS | Status: AC
  Administered 2017-03-06: 1500 mg via INTRAVENOUS
  Filled 2017-03-06: qty 1500

## 2017-03-06 MED ORDER — IPRATROPIUM-ALBUTEROL 0.5-2.5 (3) MG/3ML IN SOLN
3.0000 mL | Freq: Three times a day (TID) | RESPIRATORY_TRACT | Status: DC
Start: 2017-03-06 — End: 2017-03-07

## 2017-03-06 MED ORDER — SODIUM CHLORIDE 0.9 % IV SOLN
0.0000 mg/h | INTRAVENOUS | Status: DC
  Administered 2017-03-06: 2 mg/h via INTRAVENOUS
  Filled 2017-03-06: qty 10

## 2017-03-06 MED ORDER — SODIUM CHLORIDE 0.9 % IV SOLN: INTRAVENOUS | Status: DC

## 2017-03-06 MED ORDER — SODIUM BICARBONATE 8.4 % IV SOLN
100.0000 meq | Freq: Once | INTRAVENOUS | Status: AC
  Administered 2017-03-06: 100 meq via INTRAVENOUS

## 2017-03-06 MED ORDER — ATORVASTATIN CALCIUM 40 MG PO TABS: 40.0000 mg | ORAL_TABLET | Freq: Every day | ORAL | Status: DC

## 2017-03-06 MED ORDER — DEXTROSE 50 % IV SOLN
50.0000 mL | Freq: Once | INTRAVENOUS | Status: AC
  Administered 2017-03-06: 50 mL via INTRAVENOUS
  Filled 2017-03-06: qty 50

## 2017-03-06 MED ORDER — PIPERACILLIN-TAZOBACTAM IN DEX 2-0.25 GM/50ML IV SOLN
2.2500 g | Freq: Once | INTRAVENOUS | Status: AC
  Administered 2017-03-06: 2.25 g via INTRAVENOUS
  Filled 2017-03-06: qty 50

## 2017-03-06 NOTE — ED Notes (Signed)
CCM at bedside inserting CVC and art line. Pt remains on vent, no breathing over the vent at this time. No purposeful movement since intubation. Multiple family members in consult room-considering DNR status.

## 2017-03-06 NOTE — ED Notes (Signed)
Bleeding a lot during CVC insertion, hold off on Aline for now. Quick clotx2 applied

## 2017-03-06 NOTE — ED Notes (Signed)
Pt jaw clenched, no longer looking around room, eyes deviated to the LEFT. MD at bedside, verbal order for ativan 1MG  IV push. Levophed started.

## 2017-03-06 NOTE — ED Notes (Signed)
Pt's foley misplaced while in CT, frank red blood present in drainage bag and around insertion site. MD Little and Montey Hora at bedside, made aware. Verbal order to flush foley with 1L NS. Foley flushed and repositioned. Clots present.

## 2017-03-06 NOTE — ED Notes (Signed)
Date and time results received: 03/20/2017 9:26 PM (use smartphrase ".now" to insert current time)  Test: Potassium Critical Value: 6.8 (not hemolyzed)  Name of Provider Notified: Little MD  Orders Received? Or Actions Taken?: see new orders

## 2017-03-06 NOTE — ED Notes (Signed)
Attempted NG x1, unsuccessful.

## 2017-03-06 NOTE — Code Documentation (Signed)
Prepping for intubation at this time. Femoral pulses still present

## 2017-03-06 NOTE — Procedures (Signed)
Intubation Procedure Note KIT MOLLETT [id number] [birth year]  Procedure: Intubation Indications: Respiratory insufficiency  Procedure Details Consent: Risks of procedure as well as the alternatives and risks of each were explained to the (patient/caregiver).  Consent for procedure obtained. Time Out: Verified patient identification, verified procedure, site/side was marked, verified correct patient position, special equipment/implants available, medications/allergies/relevent history reviewed, required imaging and test results available.  Performed  Maximum sterile technique was used including gloves.  [place]    Evaluation Hemodynamic Status: Transient hypertension requiring treatment; O2 sats: stable throughout Patient's Current Condition: stable Complications: No apparent complications Patient did tolerate procedure well. Chest X-ray ordered to verify placement.  CXR: tube position acceptable.   Chriss Driver [facility] 02/28/2017

## 2017-03-06 NOTE — ED Notes (Signed)
Date and time results received: 03/13/2017 8:45 PM (use smartphrase ".now" to insert current time)  Test: istat lactic acid Critical Value: >17.00  Name of Provider Notified: LITTLE MD  Orders Received? Or Actions Taken?: no verbal orders, currently intubating

## 2017-03-06 NOTE — ED Notes (Signed)
BAIR HUGGER ON

## 2017-03-06 NOTE — ED Notes (Addendum)
Delay in blood draw MDs at bedside,  Nurse will labs

## 2017-03-06 NOTE — ED Notes (Signed)
XRAY CALLED

## 2017-03-06 NOTE — ED Notes (Signed)
Escorted to CT by this RN 

## 2017-03-06 NOTE — ED Notes (Signed)
Children at bedside at this time, MD at bedside updating them.

## 2017-03-06 NOTE — ED Notes (Signed)
CVC placed but BP too low to be palpated by manual cuff again. Femoral pulses strong. Shearon Stalls PA notified, see new orders

## 2017-03-06 NOTE — ED Provider Notes (Signed)
[place] DEPT Provider Note   CSN: [id number] Arrival date & time: 02/26/2017  2004     History   Chief Complaint Chief Complaint  Patient presents with  . Hypoglycemia  . Altered Mental Status    HPI Perry Kennedy is a 76 y.o. male.  The history is provided by the EMS personnel and medical records.  Altered Mental Status   This is a new problem. Episode onset: w/in the last 4hrs. The problem has not changed since onset.Associated symptoms include unresponsiveness. Risk factors: recent N/V/D. His past medical history is significant for diabetes, COPD and heart disease. His past medical history does not include CVA.    Past Medical History:  Diagnosis Date  . AAA (abdominal aortic aneurysm) ([place])   . Cataract   . Cecal ulcer   . Colon polyps    adenomatous  . COPD (chronic obstructive pulmonary disease) ([place])   . Coronary artery disease    a. cath 10/2013: diffuse moderate CAD with 50% LM stenosis, 40% LAD stenosis, 70% D1, 60% LCx, and 25% RCA stenosis. Medical management with risk factor reduction was recommended at that time.   . Depression   . Diabetes mellitus without complication ([place])   . Diverticulosis   . DVT (deep venous thrombosis) ([place])   . GERD (gastroesophageal reflux disease)   . Gout   . Headache(784.0)    migranes- not in a few years  . Hemorrhoids   . Hyperlipidemia   . Hypertension   . Insomnia   . Peripheral vascular disease [facility])     Patient Active Problem List   Diagnosis Date Noted  . COPD, severe ([place]) 11/09/2016  . Other hypertrophic cardiomyopathy ([place]) 11/03/2016  . Syncope 11/02/2016  . Depression 05/28/2014  . Type 2 diabetes mellitus with vascular disease ([place]) 11/23/2013  . Hyperlipidemia due to type 1 diabetes mellitus ([place]) 01/03/2008  . SMOKER 01/03/2008  . Essential hypertension 01/03/2008  . CAD (coronary artery disease) of artery bypass graft 01/03/2008  . Peripheral vascular disease ([place]) 01/03/2008  . INTERNAL  HEMORRHOIDS 01/03/2008  . ACID REFLUX DISEASE 01/03/2008    Past Surgical History:  Procedure Laterality Date  . ABDOMINAL AORTAGRAM N/A 10/16/2013   Procedure: ABDOMINAL Maxcine Ham;  Surgeon: Wellington Hampshire, MD;  Location: [place] CATH LAB;  Service: Cardiovascular;  Laterality: N/A;  . AORTA - BILATERAL FEMORAL ARTERY BYPASS GRAFT N/A 11/22/2013   Procedure: AORTA BIFEMORAL BYPASS GRAFT; SUPERIOR MESENTERIC ARTERY BYPASS, INFERIOR MESENTERIC BYPASS;  Surgeon: Serafina Mitchell, MD;  Location: [place];  Service: Vascular;  Laterality: N/A;  . COLONOSCOPY  10/25/2007, 04/28/2009  . ESOPHAGOGASTRODUODENOSCOPY  01/03/2003  . HEMICOLECTOMY    . LEFT HEART CATHETERIZATION WITH CORONARY ANGIOGRAM N/A 10/29/2013   Procedure: LEFT HEART CATHETERIZATION WITH CORONARY ANGIOGRAM;  Surgeon: Minus Breeding, MD;  Location: [facility] CATH LAB;  Service: Cardiovascular;  Laterality: N/A;  . VISCERAL ANGIOGRAM N/A 11/22/2013   Procedure: VISCERAL ANGIOGRAM;  Surgeon: Elam Dutch, MD;  Location: [facility] CATH LAB;  Service: Cardiovascular;  Laterality: N/A;       Home Medications    Prior to Admission medications   Medication Sig Start Date End Date Taking? Authorizing Provider  atorvastatin (LIPITOR) 40 MG tablet Take 1 tablet (40 mg total) by mouth daily. 12/20/16   Mary-Margaret Hassell Done, FNP  cilostazol (PLETAL) 100 MG tablet Take 1 tablet (100 mg total) by mouth 2 (two) times daily. 12/20/16   Mary-Margaret Hassell Done, FNP  citalopram (CELEXA) 20 MG tablet Take 1 tablet (20  mg total) by mouth daily. 12/20/16   Mary-Margaret Hassell Done, FNP  clopidogrel (PLAVIX) 75 MG tablet TAKE ONE TABLET BY MOUTH ONCE DAILY 09/08/16   Mary-Margaret Hassell Done, FNP  furosemide (LASIX) 20 MG tablet Take 1 tablet (20 mg total) by mouth daily. 12/20/16   Mary-Margaret Hassell Done, FNP  ipratropium-albuterol (DUONEB) 0.5-2.5 (3) MG/3ML SOLN Take 3 mLs by nebulization every 4 (four) hours as needed. Diagnosis: J44.9, J96.01 12/20/16   Mary-Margaret Hassell Done, FNP    lisinopril (PRINIVIL) 10 MG tablet Take 1 tablet (10 mg total) by mouth daily. 03/02/17   Mary-Margaret Hassell Done, FNP  metFORMIN (GLUCOPHAGE) 500 MG tablet Take 1 tablet (500 mg total) by mouth 2 (two) times daily with a meal. 12/20/16   Mary-Margaret Hassell Done, FNP  metoprolol tartrate (LOPRESSOR) 25 MG tablet Take 0.5 tablets (12.5 mg total) by mouth 2 (two) times daily. 12/20/16   Mary-Margaret Hassell Done, FNP  mirtazapine (REMERON) 30 MG tablet One po qhs 12/20/16   Mary-Margaret Hassell Done, FNP  omeprazole (PRILOSEC) 20 MG capsule Take 1 capsule (20 mg total) by mouth daily. 12/20/16   Mary-Margaret Hassell Done, FNP    Family History Family History  Problem Relation Age of Onset  . Heart disease Father 52    pacemaker  . Diabetes Mother     renal failure  . Heart disease Mother   . CAD Brother     died at 46  . COPD Brother   . Heart disease Brother   . Cancer Brother   . Diabetes Son   . Heart disease Son     before age 55-  PVD    . Heart attack Son   . Hyperlipidemia Son   . Lung cancer Brother   . Heart disease Brother   . Hyperlipidemia Daughter     Social History Social History  Substance Use Topics  . Smoking status: Current Every Day Smoker    Packs/day: 2.00    Years: 60.00    Types: Cigarettes  . Smokeless tobacco: Never Used  . Alcohol use No     Allergies   Morphine and related   Review of Systems Review of Systems  Unable to perform ROS: Patient unresponsive     Physical Exam Updated Vital Signs BP (!) 115/59 (BP Location: Left Arm)   Pulse 61   Temp (!) 91.4 F (33 C) (Rectal)   Resp 16   SpO2 100%   Physical Exam  Constitutional: He appears well-developed and well-nourished.  HENT:  Head: Normocephalic and atraumatic.  Poor dentition  Eyes: Conjunctivae are normal. Pupils are equal, round, and reactive to light. Scleral icterus is present.  Neck: Neck supple. No tracheal deviation present.  Cardiovascular: Regular rhythm.   No murmur  heard. Tachycardic  Pulmonary/Chest: Breath sounds normal. No stridor. No respiratory distress.  Non-labored respirations, decreased breath sounds in RLL field  Abdominal: Soft. He exhibits no distension.  Musculoskeletal: He exhibits no edema or deformity.  Neurological:  Responds to noxious stimuli  Skin: He is not diaphoretic.  Cool, dry, icteric  Nursing note and vitals reviewed.  ED Treatments / Results  Labs (all labs ordered are listed, but only abnormal results are displayed) Labs Reviewed  CULTURE, BLOOD (ROUTINE X 2)  CULTURE, BLOOD (ROUTINE X 2)  COMPREHENSIVE METABOLIC PANEL  CBC  PROTIME-INR  CK  CBG MONITORING, ED  CBG MONITORING, ED  I-STAT CG4 LACTIC ACID, ED    EKG  EKG Interpretation None       Radiology No results found.  Procedures Procedure Name: Intubation Date/Time: 03/17/2017 9:00 PM Performed by: Jenny Reichmann Pre-anesthesia Checklist: Patient identified, Emergency Drugs available, Suction available and Patient being monitored Oxygen Delivery Method: Ambu bag Preoxygenation: Pre-oxygenation with 100% oxygen Intubation Type: Rapid sequence Ventilation: Mask ventilation without difficulty Laryngoscope Size: Glidescope Grade View: Grade I Tube size: 7.5 mm Number of attempts: 1 Airway Equipment and Method: Video-laryngoscopy and Rigid stylet Placement Confirmation: ETT inserted through vocal cords under direct vision,  CO2 detector and Breath sounds checked- equal and bilateral Secured at: 22 cm Tube secured with: ETT holder      (including critical care time)  Medications Ordered in ED Medications  sodium chloride 0.9 % bolus 2,100 mL (not administered)  vancomycin (VANCOCIN) 1,500 mg in sodium chloride 0.9 % 500 mL IVPB (not administered)  piperacillin-tazobactam (ZOSYN) IVPB 2.25 g (not administered)     Initial Impression / Assessment and Plan / ED Course  I have reviewed the triage vital signs and the nursing  notes.  Pertinent labs & imaging results that were available during my care of the patient were reviewed by me and considered in my medical decision making (see chart for details).    Pt presents with AMS. EMS called to home by family @~1930  for unconsciousness; Pt was last seen normal around 1600hrs. Family reports several days of diarrhea & says that he's been complaining of some abdominal pain. FSBG 35 on EMS arrival w/BP 60s/40s; they gave D10 & 1.5L NS PTA. Awake, but responsive only to noxious stimuli & hypotensive on presentation.  VS & exam as above. CODE SEPSIS initiated. Cultures drawn; 30cc/kg NS, vancomycin, & Zosyn given. Found to be hypothermic, so actively warmed with bear hugger.  Eyes deviated to the right w/clenched jaw; 1mg  Ativan given for possible seizure activity. Pt again became hypotensive, so levophed gtt started.  Intubated in the ED w/etomidate & rocuronium. Arterial line placed by RT.  Labs remarkable for K 6.8, Crt 3.16, AlkPhos 690, AST 1996, ALT 442, WBC 17.9, Hgb 8.2, INR 4.57, LA >17.  Calcium given for hyperkalemia.  CXR w/stable chronic changes & possible small b/l pleural effusions. CT head w/NAICA. CT A&P read pending.  Will admit the Pt to the ICU for further evaluation and treatment.  Final Clinical Impressions(s) / ED Diagnoses   Final diagnoses:  Seizures ([place])  Septic shock ([place])  Acute liver failure with hepatic coma (HCC)  Altered mental status, unspecified altered mental status type  Community acquired pneumonia, unspecified laterality  AKI (acute kidney injury) ([place])    New Prescriptions New Prescriptions   No medications on file     Jenny Reichmann, MD March 11, 2017 [place], MD 03/14/17 2052

## 2017-03-06 NOTE — ED Notes (Signed)
Pt no longer with palpable manual BP; automatic BP reading 43/27; MD LITTLE notified of same; two bags warmed IVF started with pressure bags; weak peripheral pulses present

## 2017-03-06 NOTE — Code Documentation (Signed)
No palpable BP, pulses remain intact. MD called to bedside

## 2017-03-06 NOTE — H&P (Signed)
Name: Perry Kennedy MRN: [id number] DOB: Feb 05, 1941    LOS: 0  PCCM ADMISSION NOTE  History of Present Illness:  Perry Kennedy is a 76yo male with PMH of COPD, DM, AAA, CAD , h/o DVT, GERD, HTN, HLD, PVD s/p aortoiliac bypass, h/o hemicolectomy 2/2 cecal ulcer, presenting to [facility] after family found him unresponsive. He has been having a few days of diarrhea, abdominal pain and 1-2 days of altered mental status and weakness. Family state that due to his status, he has not been taking his home medicines for about one week, though he has taken a couple of imodiums to try to control his diarrhea. Family deny other symptoms of coughing, fevers, chills, nausea, vomiting, chest pain; bloody or melanic bowel movements. They deny history of EtOH or drug use.   PCCM called to admit.   Lines / Drains: PIV 4/16 CVC LIJ 4/16>>  Cultures: BCx x2 4/16>>  Antibiotics: Vanc 4/16>> Zosyn 4/16>>  Tests / Events: 4/16 presented to ED with unresponsiveness   The patient is sedated, intubated and unable to provide history, which was obtained for available medical records.    Past Medical History:  Diagnosis Date  . AAA (abdominal aortic aneurysm) ([place])   . Cataract   . Cecal ulcer   . Colon polyps    adenomatous  . COPD (chronic obstructive pulmonary disease) ([place])   . Coronary artery disease    a. cath 10/2013: diffuse moderate CAD with 50% LM stenosis, 40% LAD stenosis, 70% D1, 60% LCx, and 25% RCA stenosis. Medical management with risk factor reduction was recommended at that time.   . Depression   . Diabetes mellitus without complication ([place])   . Diverticulosis   . DVT (deep venous thrombosis) ([place])   . GERD (gastroesophageal reflux disease)   . Gout   . Headache(784.0)    migranes- not in a few years  . Hemorrhoids   . Hyperlipidemia   . Hypertension   . Insomnia   . Peripheral vascular disease [facility])    Past Surgical History:  Procedure Laterality Date  . ABDOMINAL AORTAGRAM  N/A 10/16/2013   Procedure: ABDOMINAL Maxcine Ham;  Surgeon: Wellington Hampshire, MD;  Location: [place] CATH LAB;  Service: Cardiovascular;  Laterality: N/A;  . AORTA - BILATERAL FEMORAL ARTERY BYPASS GRAFT N/A 11/22/2013   Procedure: AORTA BIFEMORAL BYPASS GRAFT; SUPERIOR MESENTERIC ARTERY BYPASS, INFERIOR MESENTERIC BYPASS;  Surgeon: Serafina Mitchell, MD;  Location: [place];  Service: Vascular;  Laterality: N/A;  . COLONOSCOPY  10/25/2007, 04/28/2009  . ESOPHAGOGASTRODUODENOSCOPY  01/03/2003  . HEMICOLECTOMY    . LEFT HEART CATHETERIZATION WITH CORONARY ANGIOGRAM N/A 10/29/2013   Procedure: LEFT HEART CATHETERIZATION WITH CORONARY ANGIOGRAM;  Surgeon: Minus Breeding, MD;  Location: [facility] CATH LAB;  Service: Cardiovascular;  Laterality: N/A;  . VISCERAL ANGIOGRAM N/A 11/22/2013   Procedure: VISCERAL ANGIOGRAM;  Surgeon: Elam Dutch, MD;  Location: [facility] CATH LAB;  Service: Cardiovascular;  Laterality: N/A;   Prior to Admission medications   Medication Sig Start Date End Date Taking? Authorizing Provider  atorvastatin (LIPITOR) 40 MG tablet Take 1 tablet (40 mg total) by mouth daily. 12/20/16   Mary-Margaret Hassell Done, FNP  cilostazol (PLETAL) 100 MG tablet Take 1 tablet (100 mg total) by mouth 2 (two) times daily. 12/20/16   Mary-Margaret Hassell Done, FNP  citalopram (CELEXA) 20 MG tablet Take 1 tablet (20 mg total) by mouth daily. 12/20/16   Mary-Margaret Hassell Done, FNP  clopidogrel (PLAVIX) 75 MG tablet TAKE ONE TABLET BY MOUTH ONCE  DAILY 09/08/16   Mary-Margaret Hassell Done, FNP  furosemide (LASIX) 20 MG tablet Take 1 tablet (20 mg total) by mouth daily. 12/20/16   Mary-Margaret Hassell Done, FNP  ipratropium-albuterol (DUONEB) 0.5-2.5 (3) MG/3ML SOLN Take 3 mLs by nebulization every 4 (four) hours as needed. Diagnosis: J44.9, J96.01 12/20/16   Mary-Margaret Hassell Done, FNP  lisinopril (PRINIVIL) 10 MG tablet Take 1 tablet (10 mg total) by mouth daily. 03/02/17   Mary-Margaret Hassell Done, FNP  metFORMIN (GLUCOPHAGE) 500 MG tablet Take 1 tablet (500  mg total) by mouth 2 (two) times daily with a meal. 12/20/16   Mary-Margaret Hassell Done, FNP  metoprolol tartrate (LOPRESSOR) 25 MG tablet Take 0.5 tablets (12.5 mg total) by mouth 2 (two) times daily. 12/20/16   Mary-Margaret Hassell Done, FNP  mirtazapine (REMERON) 30 MG tablet One po qhs 12/20/16   Mary-Margaret Hassell Done, FNP  omeprazole (PRILOSEC) 20 MG capsule Take 1 capsule (20 mg total) by mouth daily. 12/20/16   Mary-Margaret Hassell Done, FNP   Allergies Allergies  Allergen Reactions  . Morphine And Related Other (See Comments)    "goes crazy"    Family History Family History  Problem Relation Age of Onset  . Heart disease Father 37    pacemaker  . Diabetes Mother     renal failure  . Heart disease Mother   . CAD Brother     died at 23  . COPD Brother   . Heart disease Brother   . Cancer Brother   . Diabetes Son   . Heart disease Son     before age 53-  PVD    . Heart attack Son   . Hyperlipidemia Son   . Lung cancer Brother   . Heart disease Brother   . Hyperlipidemia Daughter     Social History  reports that he has been smoking Cigarettes.  He has a 120.00 pack-year smoking history. He has never used smokeless tobacco. He reports that he does not drink alcohol or use drugs.  Review Of Systems  11 points review of systems is negative with an exception of listed in HPI.  Vital Signs: Temp:  [91.4 F (33 C)] 91.4 F (33 C) (04/16 2014) Pulse Rate:  [61-182] 93 (04/16 2307) Resp:  [12-22] 15 (04/16 2307) BP: (43-128)/(27-78) 66/41 (04/16 2307) SpO2:  [71 %-100 %] 100 % (04/16 2307) Arterial Line BP: (-8)/(-13) -8/-13 (04/16 2105) FiO2 (%):  [100 %] 100 % (04/16 2046) Weight:  [65.8 kg (145 lb 1 oz)] 65.8 kg (145 lb 1 oz) (04/16 2100) No intake/output data recorded.  Physical Examination: General:  Ill appearing elderly gentleman Neuro:  Not responsive to pain, PERRL   HEENT:  ETT in place, Scleral icterus Neck:  No JVD, LIJ CVC in place with hematoma   Cardiovascular:  RRR  no murmurs, rubs or gallops, no LE edema Lungs:  Mechanical breath sounds present bilaterally Abdomen:  Distended, ascites, hepatomegaly ~5cm below ribs, decreased bowel sounds Musculoskeletal:  Decreased muscle mass Skin:  Multiple echymoses on arms, telangiectasias on face and upper thorax  Ventilator settings: Vent Mode: PRVC FiO2 (%):  [100 %] 100 % Set Rate:  [15 bmp] 15 bmp Vt Set:  [550 mL] 550 mL PEEP:  [5 cmH20] 5 cmH20 Plateau Pressure:  [18 cmH20] 18 cmH20  Labs and Imaging:  Reviewed.  Please refer to the Assessment and Plan section for relevant results.  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%, G1DD) PLAN:  s/p 2.1L Levophed gtt  RENAL  ASSESSMENT:  AKI; Cr 3.6 (b/l 0.8) Hyperkalemia Anion gap and nonanion gap metabolic acidosis - lactic acidosis, diarrhea, metformin use PLAN:   Bicarb gtt Follow Bmets  GASTROINTESTINAL  ASSESSMENT:  Fulminant hepatic failure - INR 4.6, AST 1996, ALT 442, Alk phos 690, Bili 6, Alb 1.4 CT abd/pel - concerning for liver lesions or metastatic disease PLAN:   NPO f/u AFP, abd u/s May need biopsy down the road, pending family discussion Protonix   HEMATOLOGIC  ASSESSMENT:  Chronic anemia, stable; Hgb 8.2 at admission INR 4.6, plts 197 PLAN:  Type and screen Transfuse 2U FFP Hold pharmacologic VTE ppx  INFECTIOUS  ASSESSMENT:  Concern for septic shock; bibasilar pneumonia on CT PLAN:   Vanc Zosyn Follow CBCs  ENDOCRINE  ASSESSMENT:  h/o T2DM; hypoglycemic at admission  PLAN:   Follow CBGs Dextrose for hypoglycemia SSI-s  NEUROLOGIC  ASSESSMENT:  Unresponsive in setting  of shock h/o CVA PLAN:   Continue monitoring  RASS goal 0, -1   Best practices / Disposition: -->ICU status under PCCM -->full code -->Hold VTE ppx for now -->Protonix for GI Px -->ventilator bundle -->diet - NPO -->family updated at bedside  The patient is critically ill with multiple organ systems failure and requires high complexity decision making for assessment and support, frequent evaluation and titration of therapies, application of advanced monitoring technologies and extensive interpretation of multiple databases. Critical Care Time devoted to patient care services described in this note is 33 minutes.  Alphonzo Grieve 03/16/2017, 11:19 PM   Attending Note:  I have examined patient, reviewed labs, studies and notes. I have discussed the case with Dr Jari Favre, and I agree with the data and plans as amended above. 76 yo man with hx MMP including extensive vascular disease - CAD, PVD (w A bi Fem Bypass), PUD, DM, COPD. He has hade several days of diarrhea, progressive altered MS. Brought to ED, intubated for unresponsiveness. Found to be in shock with metabolic disarray - gap and non-gap acidosis, acute renal failure, lactic acidosis, transaminitis. He was treated medically for hyperK+. On my eval he is intubated, unresponsive, in shock. SBP 80 on norepi 50. He is cool. Lungs are coarse B. Heart regular, tachy. Abdomen distended and tympanitic. No BS heard. Clearly profoundly ill, poor prognosis for survival due to septic and hypovolemic shock, acute renal failure, acidosis. Unclear whether he has ischemic source - none seen on Ct abdomen. Discussions undertaken with his family - prognosis, severity of his illness. In particular discussed the low likelihood of benefit from escalation of his care. Given his wishes, we have agreed that he would not want HD, escalation of care from here. We will continue all medical care, current pressors. Will not escalate from here. Will not pursue HD. DNR  orders placed.   Independent critical care time is 45 minutes.   Baltazar Apo, MD, PhD Mar 27, 2017, 12:04 AM [facility] [phone] or if no answer [phone]

## 2017-03-06 NOTE — Code Documentation (Addendum)
Pt arrives via EMS from home, called out by family for unconsciousness. Pt's family reports diarrhea for several days, but no other reported issues. LSW 4PM today. Pt is responsive to pain upon his arrival to ED, opening his eyes with incomprehensible speech. Jaundiced and cool to the touch. MD at bedside.   Initial CBG 35, BP 60/40. 25G D10 given, 1500ML NS given by EMS. Rales on assessment  NS bolus started. No family at bedside.

## 2017-03-06 NOTE — Progress Notes (Signed)
Pharmacy Antibiotic Note  Perry Kennedy is a 76 y.o. male admitted on 03/11/2017 with pneumonia.  Pharmacy has been consulted for vancomycin and zosyn dosing. Patient is in acute renal failure with sCr 3.16 and CrCl 19 ml/min.  Vancomycin trough goal 15-20  Plan: 1) Vancomycin 1500mg  IV x 1 then 1000mg  IV q48 2) Zosyn 2.25g IV q6 3) Follow renal function, cultures, LOT, level if needed  Height: 5\' 10"  (177.8 cm) Weight: 145 lb 1 oz (65.8 kg) IBW/kg (Calculated) : 73  Temp (24hrs), Avg:91.4 F (33 C), Min:91.4 F (33 C), Max:91.4 F (33 C)   Recent Labs Lab 02/22/2017 2019 03/14/2017 2042  WBC 17.9*  --   CREATININE 3.16*  --   LATICACIDVEN  --  >17.00*    Estimated Creatinine Clearance: 19.1 mL/min (A) (by C-G formula based on SCr of 3.16 mg/dL (H)).    Allergies  Allergen Reactions  . Morphine And Related Other (See Comments)    "goes crazy"    Antimicrobials this admission: 4/16 Vancomycin >> 4/16 Zosyn >>  Dose adjustments this admission: n/a  Microbiology results: 4/16 blood x2 >>  Thank you for allowing pharmacy to be a part of this patient's care.  Deboraha Sprang 03/20/2017 9:35 PM

## 2017-03-06 NOTE — Procedures (Signed)
Central Venous Catheter Insertion Procedure Note Perry Kennedy [id number] 20-Jun-1941  Procedure: Insertion of Central Venous Catheter Indications: Assessment of intravascular volume, Drug and/or fluid administration and Frequent blood sampling  Procedure Details Consent: Risks of procedure as well as the alternatives and risks of each were explained to the (patient/caregiver).  Consent for procedure obtained. Time Out: Verified patient identification, verified procedure, site/side was marked, verified correct patient position, special equipment/implants available, medications/allergies/relevent history reviewed, required imaging and test results available.  Performed  Maximum sterile technique was used including antiseptics, cap, gloves, gown, hand hygiene, mask and sheet. Skin prep: Chlorhexidine; local anesthetic administered A antimicrobial bonded/coated triple lumen catheter was placed in the left internal jugular vein using the Seldinger technique.  Evaluation Blood flow good Complications: No apparent complications Patient did tolerate procedure well. Chest X-ray ordered to verify placement.  CXR: pending.  Procedure performed under direct ultrasound guidance for real time vessel cannulation.      Montey Hora, [facility] Pager: [phone]  or [phone] 02/28/2017, 11:11 PM

## 2017-03-06 NOTE — ED Notes (Signed)
Date and time results received: 02/24/2017 9:19 PM (use smartphrase ".now" to insert current time)  Test: INR Critical Value: 4.57  Name of Provider Notified: Petit MD  Orders Received? Or Actions Taken?: no new orders

## 2017-03-07 ENCOUNTER — Inpatient Hospital Stay (HOSPITAL_COMMUNITY): Payer: Medicare Other

## 2017-03-07 DIAGNOSIS — N179 Acute kidney failure, unspecified: Secondary | ICD-10-CM

## 2017-03-07 LAB — BASIC METABOLIC PANEL
ANION GAP: 22 — AB (ref 5–15)
Anion gap: 24 — ABNORMAL HIGH (ref 5–15)
BUN: 43 mg/dL — ABNORMAL HIGH (ref 6–20)
BUN: 44 mg/dL — ABNORMAL HIGH (ref 6–20)
CHLORIDE: 103 mmol/L (ref 101–111)
CHLORIDE: 97 mmol/L — AB (ref 101–111)
CO2: 10 mmol/L — ABNORMAL LOW (ref 22–32)
CO2: 7 mmol/L — AB (ref 22–32)
Calcium: 7.2 mg/dL — ABNORMAL LOW (ref 8.9–10.3)
Calcium: 7.3 mg/dL — ABNORMAL LOW (ref 8.9–10.3)
Creatinine, Ser: 2.67 mg/dL — ABNORMAL HIGH (ref 0.61–1.24)
Creatinine, Ser: 3.14 mg/dL — ABNORMAL HIGH (ref 0.61–1.24)
GFR calc non Af Amer: 18 mL/min — ABNORMAL LOW (ref >60)
GFR calc non Af Amer: 22 mL/min — ABNORMAL LOW (ref >60)
GFR, EST AFRICAN AMERICAN: 21 mL/min — AB (ref >60)
GFR, EST AFRICAN AMERICAN: 25 mL/min — AB (ref >60)
Glucose, Bld: 213 mg/dL — ABNORMAL HIGH (ref 65–99)
Glucose, Bld: 256 mg/dL — ABNORMAL HIGH (ref 65–99)
POTASSIUM: 6.4 mmol/L — AB (ref 3.5–5.1)
POTASSIUM: 6.8 mmol/L — AB (ref 3.5–5.1)
SODIUM: 131 mmol/L — AB (ref 135–145)
SODIUM: 132 mmol/L — AB (ref 135–145)

## 2017-03-07 LAB — PHOSPHORUS: Phosphorus: 9.6 mg/dL — ABNORMAL HIGH (ref 2.5–4.6)

## 2017-03-07 LAB — HEPATIC FUNCTION PANEL
ALBUMIN: 1.6 g/dL — AB (ref 3.5–5.0)
ALK PHOS: 675 U/L — AB (ref 38–126)
ALT: 716 U/L — AB (ref 17–63)
AST: 4197 U/L — AB (ref 15–41)
BILIRUBIN INDIRECT: 2 mg/dL — AB (ref 0.3–0.9)
Bilirubin, Direct: 3.6 mg/dL — ABNORMAL HIGH (ref 0.1–0.5)
TOTAL PROTEIN: 4.2 g/dL — AB (ref 6.5–8.1)
Total Bilirubin: 5.6 mg/dL — ABNORMAL HIGH (ref 0.3–1.2)

## 2017-03-07 LAB — BLOOD CULTURE ID PANEL (REFLEXED)
Acinetobacter baumannii: NOT DETECTED
CANDIDA GLABRATA: NOT DETECTED
CANDIDA KRUSEI: NOT DETECTED
CANDIDA PARAPSILOSIS: NOT DETECTED
Candida albicans: NOT DETECTED
Candida tropicalis: NOT DETECTED
ENTEROBACTER CLOACAE COMPLEX: NOT DETECTED
ESCHERICHIA COLI: NOT DETECTED
Enterobacteriaceae species: NOT DETECTED
Enterococcus species: NOT DETECTED
Haemophilus influenzae: NOT DETECTED
KLEBSIELLA OXYTOCA: NOT DETECTED
Klebsiella pneumoniae: NOT DETECTED
LISTERIA MONOCYTOGENES: NOT DETECTED
Methicillin resistance: NOT DETECTED
Neisseria meningitidis: NOT DETECTED
PROTEUS SPECIES: NOT DETECTED
Pseudomonas aeruginosa: NOT DETECTED
STREPTOCOCCUS PNEUMONIAE: NOT DETECTED
Serratia marcescens: NOT DETECTED
Staphylococcus aureus (BCID): NOT DETECTED
Staphylococcus species: DETECTED — AB
Streptococcus agalactiae: NOT DETECTED
Streptococcus pyogenes: NOT DETECTED
Streptococcus species: NOT DETECTED

## 2017-03-07 LAB — GLUCOSE, CAPILLARY
GLUCOSE-CAPILLARY: 200 mg/dL — AB (ref 65–99)
Glucose-Capillary: 237 mg/dL — ABNORMAL HIGH (ref 65–99)
Glucose-Capillary: 272 mg/dL — ABNORMAL HIGH (ref 65–99)

## 2017-03-07 LAB — TYPE AND SCREEN
ABO/RH(D): A POS
Antibody Screen: NEGATIVE

## 2017-03-07 LAB — LACTIC ACID, PLASMA
Lactic Acid, Venous: 15.7 mmol/L (ref 0.5–1.9)
Lactic Acid, Venous: 15.1 mmol/L (ref 0.5–1.9)

## 2017-03-07 LAB — CBC
HEMATOCRIT: 23.6 % — AB (ref 39.0–52.0)
HEMOGLOBIN: 7.1 g/dL — AB (ref 13.0–17.0)
MCH: 20.9 pg — ABNORMAL LOW (ref 26.0–34.0)
MCHC: 29.2 g/dL — AB (ref 30.0–36.0)
MCV: 71.5 fL — AB (ref 78.0–100.0)
Platelets: 168 10*3/uL (ref 150–400)
RBC: 3.3 MIL/uL — ABNORMAL LOW (ref 4.22–5.81)
RDW: 20.6 % — ABNORMAL HIGH (ref 11.5–15.5)
WBC: 8.8 10*3/uL (ref 4.0–10.5)

## 2017-03-07 LAB — COOXEMETRY PANEL
Carboxyhemoglobin: 0.3 % — ABNORMAL LOW (ref 0.5–1.5)
METHEMOGLOBIN: 1 % (ref 0.0–1.5)
O2 SAT: 95.8 %
TOTAL HEMOGLOBIN: 8.3 g/dL — AB (ref 12.0–16.0)

## 2017-03-07 LAB — MAGNESIUM: Magnesium: 1.9 mg/dL (ref 1.7–2.4)

## 2017-03-07 LAB — PROTIME-INR
INR: 3.24
Prothrombin Time: 33.8 seconds — ABNORMAL HIGH (ref 11.4–15.2)

## 2017-03-07 LAB — HEPATITIS C ANTIBODY (REFLEX): HCV Ab: <0.1 s/co ratio (ref 0.0–0.9)

## 2017-03-07 LAB — RAPID HIV SCREEN (HIV 1/2 AB+AG)
HIV 1/2 ANTIBODIES: NONREACTIVE
HIV-1 P24 ANTIGEN - HIV24: NONREACTIVE

## 2017-03-07 LAB — HEPATITIS B SURFACE ANTIGEN: Hepatitis B Surface Ag: NONREACTIVE

## 2017-03-07 LAB — HCV COMMENT:

## 2017-03-07 LAB — TROPONIN I
Troponin I: 0.09 ng/mL (ref <0.03)
Troponin I: 0.21 ng/mL (ref <0.03)

## 2017-03-07 LAB — MRSA PCR SCREENING: MRSA by PCR: NEGATIVE

## 2017-03-07 LAB — OSMOLALITY: OSMOLALITY: 307 mosm/kg — AB (ref 275–295)

## 2017-03-07 MED ORDER — CALCIUM GLUCONATE 10 % IV SOLN: 1.0000 g | Freq: Once | INTRAVENOUS | Status: DC

## 2017-03-07 MED ORDER — SODIUM POLYSTYRENE SULFONATE 15 GM/60ML PO SUSP
30.0000 g | Freq: Once | ORAL | Status: AC
  Administered 2017-03-07: 30 g via ORAL
  Filled 2017-03-07: qty 120

## 2017-03-07 MED ORDER — SODIUM CHLORIDE 0.9 % IV SOLN
1.0000 g | Freq: Once | INTRAVENOUS | Status: AC
  Administered 2017-03-07: 1 g via INTRAVENOUS
  Filled 2017-03-07: qty 10

## 2017-03-07 MED ORDER — ORAL CARE MOUTH RINSE
15.0000 mL | OROMUCOSAL | Status: DC
  Administered 2017-03-07: 15 mL via OROMUCOSAL

## 2017-03-07 MED ORDER — DEXTROSE 50 % IV SOLN
1.0000 | Freq: Once | INTRAVENOUS | Status: AC
  Administered 2017-03-07: 50 mL via INTRAVENOUS
  Filled 2017-03-07: qty 50

## 2017-03-07 MED ORDER — MORPHINE SULFATE (PF) 2 MG/ML IV SOLN: 2.0000 mg | INTRAVENOUS | Status: DC | PRN

## 2017-03-07 MED ORDER — INSULIN ASPART 100 UNIT/ML IV SOLN
10.0000 [IU] | Freq: Once | INTRAVENOUS | Status: AC
  Administered 2017-03-07: 10 [IU] via INTRAVENOUS

## 2017-03-07 MED ORDER — CHLORHEXIDINE GLUCONATE 0.12% ORAL RINSE (MEDLINE KIT)
15.0000 mL | Freq: Two times a day (BID) | OROMUCOSAL | Status: DC
  Administered 2017-03-07: 15 mL via OROMUCOSAL

## 2017-03-08 LAB — CULTURE, BLOOD (ROUTINE X 2): Special Requests: ADEQUATE

## 2017-03-08 LAB — PREPARE FRESH FROZEN PLASMA
UNIT DIVISION: 0
Unit division: 0

## 2017-03-08 LAB — POCT I-STAT 3, ART BLOOD GAS (G3+)
ACID-BASE DEFICIT: 19 mmol/L — AB (ref 0.0–2.0)
BICARBONATE: 9.5 mmol/L — AB (ref 20.0–28.0)
O2 Saturation: 68 %
TCO2: 10 mmol/L (ref 0–100)
pCO2 arterial: 33 mmHg (ref 32.0–48.0)
pH, Arterial: 7.066 — CL (ref 7.350–7.450)
pO2, Arterial: 49 mmHg — ABNORMAL LOW (ref 83.0–108.0)

## 2017-03-08 LAB — BPAM FFP
BLOOD PRODUCT EXPIRATION DATE: 201804202359
BLOOD PRODUCT EXPIRATION DATE: 201804202359
ISSUE DATE / TIME: 201804170201
ISSUE DATE / TIME: 201804170401
UNIT TYPE AND RH: 6200
UNIT TYPE AND RH: 6200

## 2017-03-08 LAB — AFP TUMOR MARKER

## 2017-03-08 MED FILL — Medication: Qty: 1 | Status: AC

## 2017-03-09 ENCOUNTER — Telehealth: Payer: Self-pay

## 2017-03-09 NOTE — Telephone Encounter (Signed)
On 03/09/17 I received a death certificate from [facility] (faxed). The death certificate is for cremation. The patient is a patient of Doctor Elsworth Soho. The death certificate will be taken to [facility] ([address]) this pm for signature.   On 2017-03-29 I received the death certificate back from Doctor Elsworth Soho. I got the death certificate ready and called the funeral home to let them know I faxed the death certificate to the funeral home per the funeral home request.

## 2017-03-11 LAB — CULTURE, BLOOD (ROUTINE X 2): CULTURE: NO GROWTH

## 2017-03-21 ENCOUNTER — Telehealth: Payer: Self-pay

## 2017-03-21 NOTE — Progress Notes (Signed)
RN called and stated that the Pt had Passed away and that the ventilator needed to be removed from the Pt's room. RT removed the ventilator.

## 2017-03-21 NOTE — ED Notes (Signed)
Blood bank sample rejected. Reordered at this time.

## 2017-03-21 NOTE — Progress Notes (Signed)
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 He has been having a few days of diarrhea, abdominal pain and 1-2 days of altered mental status and weakness.  Florid shock, AKI   Lines / Drains: PIV 4/16 CVC LIJ 4/16>>  Cultures: BCx x2 4/16>>  Antibiotics: Vanc 4/16>> Zosyn 4/16>>  Tests / Events: 4/16 presented to ED with unresponsiveness   SUBJ - unresponsive, hypotensive on max levo gtt Bloody UO , foley Hypothermia -improved  Vital Signs: Temp:  [91.4 F (33 C)-99.9 F (37.7 C)] 99.9 F (37.7 C) (04/17 0515) Pulse Rate:  [61-182] 110 (04/17 0630) Resp:  [12-24] 18 (04/17 0630) BP: (43-135)/(17-86) 59/42 (04/17 0630) SpO2:  [71 %-100 %] 95 % (04/17 0630) Arterial Line BP: (-8)/(-13) -8/-13 (04/16 2105) FiO2 (%):  [100 %] 100 % (04/17 0515) Weight:  [145 lb 1 oz (65.8 kg)-157 lb 3 oz (71.3 kg)] 157 lb 3 oz (71.3 kg) (04/17 0500) I/O last 3 completed shifts: In: 7672.7 [I.V.:6187.7; Blood:635; Other:300; IV Piggyback:550] Out: 1400 [Urine:1400]  Physical Examination: General:  Ill appearing elderly gentleman Neuro:  comatose, PERRL   HEENT:  ETT in place, Scleral icterus Neck:  No JVD, LIJ CVC in place with hematoma   Cardiovascular:  RRR no murmurs, rubs or gallops, no LE edema Lungs:  Mechanical breath sounds present bilaterally Abdomen:  Distended, ascites, hepatomegaly ~5cm below ribs, decreased bowel sounds Musculoskeletal:  Decreased muscle mass Skin:  Multiple echymoses on arms, telangiectasias on face and upper thorax  Ventilator settings: Vent Mode: PRVC FiO2 (%):  [100 %] 100 % Set Rate:  [15 bmp] 15 bmp Vt Set:  [550 mL] 550 mL PEEP:  [5 cmH20] 5 cmH20 Plateau Pressure:  [18  cmH20-20 cmH20] 20 cmH20  Labs and Imaging:  Reviewed.  Please refer to the Assessment and Plan section for relevant results.  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%, G1DD) PLAN:   Levophed gtt  RENAL  ASSESSMENT:  AKI; Cr 3.6 (b/l 0.8) Hyperkalemia Anion gap and nonanion gap metabolic acidosis - lactic acidosis, diarrhea, metformin use PLAN:   Ct Bicarb gtt No HD    GASTROINTESTINAL  ASSESSMENT:  Fulminant hepatic failure - INR 4.6, AST 1996, ALT 442, Alk phos 690, Bili 6, Alb 1.4 CT abd/pel - concerning for liver lesions or metastatic disease vs pancreatic infiltrative process PLAN:   NPO f/u AFP Protonix   HEMATOLOGIC  ASSESSMENT:  Chronic anemia, stable; Hgb 8.2 at admission Coagulopathy s/p 2 U FFP PLAN:  Type and screen   INFECTIOUS  ASSESSMENT:  Concern for septic shock; bibasilar pneumonia on CT PLAN:   Vanc Zosyn Follow CBCs  ENDOCRINE  ASSESSMENT:  h/o T2DM; hypoglycemic at admission  PLAN:   Follow CBGs SSI-s  NEUROLOGIC  ASSESSMENT:  Unresponsive in setting of shock h/o CVA PLAN:   Continue monitoring  RASS goal 0  Summary -  Hypotensive on max pressors, no escalation, will focus on comfort, appears non -survivable MODS  at this point  The patient is critically ill with multiple organ systems failure and requires high complexity decision making for assessment and support, frequent evaluation and titration of therapies, application of advanced monitoring technologies and extensive interpretation of multiple databases. Critical Care Time devoted to patient care services described in this note independent of APP time is 31 minutes.      Perry Kennedy V. 2017/03/14, 7:52 AM

## 2017-03-21 NOTE — Progress Notes (Signed)
Critical care MDs aware of hypotension. Patient at maximum dose of Levophed. No plan to add another medication at this time for hypotension. Per MDs will discuss potential comfort measures with family later today. CDS called in regards to patient. CDS referral number : [phone]. Per CDS call them for cardiac time of death or brain death testing.

## 2017-03-21 NOTE — Telephone Encounter (Signed)
On 2017/04/02 I received a death certificate from [facility] (original). The death certificate is for cremation. The patient is a patient of Doctor Elsworth Soho. The death certificate will be taken to Pulmonary Unit @ [facility] this pm for signature.  On Apr 02, 2017 I received the death certificate back from Doctor Elsworth Soho. I got the death certificate ready and called the funeral home to let them know the death certificate was mailed to [employer] per the funeral home request.

## 2017-03-21 NOTE — Discharge Summary (Signed)
Name: Perry Kennedy MRN: [id number] DOB: [birth year]    LOS: 1  PCCM ADMISSION NOTE  History of Present Illness:  Mr. 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 He has been having a few days of diarrhea, abdominal pain and 1-2 days of altered mental status and weakness.  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%, G1DD) PLAN:   Levophed gtt  RENAL  ASSESSMENT:  AKI; Cr 3.6 (b/l 0.8) Hyperkalemia Anion gap and nonanion gap metabolic acidosis - lactic acidosis, diarrhea, metformin use PLAN:   Ct Bicarb gtt No HD    GASTROINTESTINAL  ASSESSMENT:  Fulminant hepatic failure - INR 4.6, AST 1996, ALT 442, Alk phos 690, Bili 6, Alb 1.4 CT abd/pel - concerning for liver lesions or metastatic disease vs pancreatic infiltrative process PLAN:   NPO f/u AFP Protonix   HEMATOLOGIC  ASSESSMENT:  Chronic anemia, stable; Hgb 8.2 at admission Coagulopathy s/p 2 U FFP PLAN:  Type and screen   INFECTIOUS  ASSESSMENT:  Concern for septic shock; bibasilar pneumonia on CT PLAN:   Vanc Zosyn Follow CBCs  ENDOCRINE  ASSESSMENT:  h/o T2DM; hypoglycemic at admission  PLAN:   Follow CBGs SSI-s  NEUROLOGIC  ASSESSMENT:  Unresponsive in setting of shock h/o CVA PLAN:   Continue monitoring  RASS goal 0  COURSE -  Hypotensive on max  pressors, no escalation, no HD, passed away within 24h of admit  Cause of death - Septic shock, AKI      Charolette Bultman V. 03/08/2017, 1:28 PM

## 2017-03-21 NOTE — Progress Notes (Signed)
CRITICAL VALUE ALERT  Critical value received:  K 6.4  Date of notification:  2017/03/11  Time of notification:  0019  Critical value read back:Yes.    Nurse who received alert:  Lianne Bushy  MD notified (1st page):  Montey Hora PA  Time of first page:  0019  MD notified (2nd page):  Time of second page:  Responding MD:  Montey Hora PA  Time MD responded:  [phone]

## 2017-03-21 NOTE — Progress Notes (Signed)
215 mL of fentanyl wasted in sink with Martinique Allen, RN.

## 2017-03-21 NOTE — Progress Notes (Signed)
CRITICAL VALUE ALERT  Critical value received:  K+ 6.8; L.A.  15.1; Trop 0.21  Date of notification:  2017/03/29  Time of notification:  Notified night shift RN prior to shift change  Critical value read back:Yes.    Nurse who received alert:  Roselyn Reef RN  MD notified (1st page): Notified Dr. El[facility] Soho while he is rounding on pt.  No new orders.  Team also aware of pt's current pressures while maxed out on levophed.  No escalation of care at this point.

## 2017-03-21 NOTE — Progress Notes (Signed)
Fentanyl gtt turned off due to no response to painful stimuli from patient and also due to hypotension. Will continue to monitor patient.

## 2017-03-21 DEATH — deceased
